# Patient Record
Sex: Male | Born: 1960 | Race: White | Hispanic: No | Marital: Married | Smoking: Current every day smoker
Health system: Southern US, Community
[De-identification: ages and names within clinical notes are randomized; demographics above are authoritative.]

## PROBLEM LIST (undated history)

## (undated) DIAGNOSIS — K5732 Diverticulitis of large intestine without perforation or abscess without bleeding: Secondary | ICD-10-CM

## (undated) DIAGNOSIS — R000 Tachycardia, unspecified: Secondary | ICD-10-CM

## (undated) DIAGNOSIS — C61 Malignant neoplasm of prostate: Secondary | ICD-10-CM

## (undated) DIAGNOSIS — F419 Anxiety disorder, unspecified: Secondary | ICD-10-CM

## (undated) DIAGNOSIS — Z8719 Personal history of other diseases of the digestive system: Secondary | ICD-10-CM

## (undated) DIAGNOSIS — Z720 Tobacco use: Secondary | ICD-10-CM

## (undated) DIAGNOSIS — C4491 Basal cell carcinoma of skin, unspecified: Secondary | ICD-10-CM

## (undated) DIAGNOSIS — F329 Major depressive disorder, single episode, unspecified: Secondary | ICD-10-CM

## (undated) DIAGNOSIS — K219 Gastro-esophageal reflux disease without esophagitis: Secondary | ICD-10-CM

## (undated) DIAGNOSIS — E785 Hyperlipidemia, unspecified: Secondary | ICD-10-CM

## (undated) DIAGNOSIS — I10 Essential (primary) hypertension: Secondary | ICD-10-CM

## (undated) DIAGNOSIS — F32A Depression, unspecified: Secondary | ICD-10-CM

## (undated) DIAGNOSIS — E781 Pure hyperglyceridemia: Secondary | ICD-10-CM

## (undated) HISTORY — DX: Malignant neoplasm of prostate: C61

## (undated) HISTORY — DX: Major depressive disorder, single episode, unspecified: F32.9

## (undated) HISTORY — DX: Personal history of other diseases of the digestive system: Z87.19

## (undated) HISTORY — DX: Anxiety disorder, unspecified: F41.9

## (undated) HISTORY — DX: Depression, unspecified: F32.A

## (undated) HISTORY — DX: Tobacco use: Z72.0

## (undated) HISTORY — DX: Hyperlipidemia, unspecified: E78.5

## (undated) HISTORY — PX: INSERTION PROSTATE RADIATION SEED: SUR718

## (undated) HISTORY — DX: Pure hyperglyceridemia: E78.1

## (undated) HISTORY — DX: Gastro-esophageal reflux disease without esophagitis: K21.9

## (undated) HISTORY — DX: Diverticulitis of large intestine without perforation or abscess without bleeding: K57.32

## (undated) HISTORY — DX: Essential (primary) hypertension: I10

## (undated) HISTORY — DX: Tachycardia, unspecified: R00.0

## (undated) HISTORY — DX: Basal cell carcinoma of skin, unspecified: C44.91

---

## 1999-09-26 ENCOUNTER — Inpatient Hospital Stay (HOSPITAL_COMMUNITY): Admission: EM | Admit: 1999-09-26 | Discharge: 1999-09-29 | Payer: Self-pay | Admitting: Gastroenterology

## 2000-02-25 ENCOUNTER — Emergency Department (HOSPITAL_COMMUNITY): Admission: EM | Admit: 2000-02-25 | Discharge: 2000-02-25 | Payer: Self-pay | Admitting: Emergency Medicine

## 2000-02-25 ENCOUNTER — Encounter: Payer: Self-pay | Admitting: Emergency Medicine

## 2004-07-24 ENCOUNTER — Inpatient Hospital Stay (HOSPITAL_COMMUNITY): Admission: EM | Admit: 2004-07-24 | Discharge: 2004-07-27 | Payer: Self-pay | Admitting: Emergency Medicine

## 2004-07-26 ENCOUNTER — Encounter (INDEPENDENT_AMBULATORY_CARE_PROVIDER_SITE_OTHER): Payer: Self-pay | Admitting: *Deleted

## 2008-08-30 ENCOUNTER — Encounter: Payer: Self-pay | Admitting: Family Medicine

## 2009-01-19 ENCOUNTER — Ambulatory Visit: Payer: Self-pay | Admitting: Family Medicine

## 2009-01-19 DIAGNOSIS — E7800 Pure hypercholesterolemia, unspecified: Secondary | ICD-10-CM | POA: Insufficient documentation

## 2009-01-19 DIAGNOSIS — Z8719 Personal history of other diseases of the digestive system: Secondary | ICD-10-CM

## 2009-01-19 DIAGNOSIS — K219 Gastro-esophageal reflux disease without esophagitis: Secondary | ICD-10-CM

## 2009-01-19 HISTORY — DX: Personal history of other diseases of the digestive system: Z87.19

## 2009-01-23 LAB — CONVERTED CEMR LAB
BUN: 9 mg/dL (ref 6–23)
CO2: 31 meq/L (ref 19–32)
Calcium: 9.4 mg/dL (ref 8.4–10.5)
Chloride: 97 meq/L (ref 96–112)
Cholesterol: 282 mg/dL — ABNORMAL HIGH (ref 0–200)
Creatinine, Ser: 1.1 mg/dL (ref 0.4–1.5)
Direct LDL: 184.1 mg/dL
GFR calc non Af Amer: 75.37 mL/min (ref >60)
Glucose, Bld: 95 mg/dL (ref 70–99)
HDL: 42.7 mg/dL (ref >39.00)
PSA: 3.03 ng/mL (ref 0.10–4.00)
Potassium: 4.3 meq/L (ref 3.5–5.1)
Sodium: 141 meq/L (ref 135–145)
Total CHOL/HDL Ratio: 7
Triglycerides: 330 mg/dL — ABNORMAL HIGH (ref 0.0–149.0)
VLDL: 66 mg/dL — ABNORMAL HIGH (ref 0.0–40.0)

## 2009-01-24 ENCOUNTER — Telehealth: Payer: Self-pay | Admitting: Family Medicine

## 2009-01-26 ENCOUNTER — Ambulatory Visit: Payer: Self-pay | Admitting: Family Medicine

## 2009-01-26 LAB — CONVERTED CEMR LAB
Cholesterol, target level: 200 mg/dL
HDL goal, serum: 40 mg/dL
LDL Goal: 130 mg/dL

## 2009-01-28 ENCOUNTER — Encounter: Payer: Self-pay | Admitting: Family Medicine

## 2009-04-27 ENCOUNTER — Ambulatory Visit: Payer: Self-pay | Admitting: Family Medicine

## 2009-04-27 LAB — CONVERTED CEMR LAB
ALT: 14 units/L (ref 0–53)
AST: 16 units/L (ref 0–37)
Albumin: 4.5 g/dL (ref 3.5–5.2)
Alkaline Phosphatase: 38 units/L — ABNORMAL LOW (ref 39–117)
Bilirubin, Direct: 0 mg/dL (ref 0.0–0.3)
Cholesterol: 242 mg/dL — ABNORMAL HIGH (ref 0–200)
Direct LDL: 174.6 mg/dL
HDL: 38.2 mg/dL — ABNORMAL LOW (ref >39.00)
Total Bilirubin: 1.1 mg/dL (ref 0.3–1.2)
Total CHOL/HDL Ratio: 6
Total Protein: 7.7 g/dL (ref 6.0–8.3)
Triglycerides: 167 mg/dL — ABNORMAL HIGH (ref 0.0–149.0)
VLDL: 33.4 mg/dL (ref 0.0–40.0)

## 2009-05-16 ENCOUNTER — Ambulatory Visit: Payer: Self-pay | Admitting: Family Medicine

## 2009-05-30 ENCOUNTER — Encounter: Payer: Self-pay | Admitting: Family Medicine

## 2009-12-21 ENCOUNTER — Ambulatory Visit: Payer: Self-pay | Admitting: Family Medicine

## 2009-12-21 DIAGNOSIS — F329 Major depressive disorder, single episode, unspecified: Secondary | ICD-10-CM | POA: Insufficient documentation

## 2010-01-23 ENCOUNTER — Ambulatory Visit: Payer: Self-pay | Admitting: Family Medicine

## 2010-01-23 DIAGNOSIS — R030 Elevated blood-pressure reading, without diagnosis of hypertension: Secondary | ICD-10-CM

## 2010-02-03 ENCOUNTER — Emergency Department: Payer: Self-pay | Admitting: Internal Medicine

## 2010-02-03 ENCOUNTER — Encounter: Payer: Self-pay | Admitting: Family Medicine

## 2010-02-09 ENCOUNTER — Ambulatory Visit: Payer: Self-pay | Admitting: Family Medicine

## 2010-02-09 DIAGNOSIS — I10 Essential (primary) hypertension: Secondary | ICD-10-CM | POA: Insufficient documentation

## 2010-03-15 ENCOUNTER — Ambulatory Visit: Payer: Self-pay | Admitting: Family Medicine

## 2010-05-16 ENCOUNTER — Telehealth: Payer: Self-pay | Admitting: Family Medicine

## 2010-06-15 ENCOUNTER — Telehealth (INDEPENDENT_AMBULATORY_CARE_PROVIDER_SITE_OTHER): Payer: Self-pay | Admitting: *Deleted

## 2010-07-25 ENCOUNTER — Telehealth (INDEPENDENT_AMBULATORY_CARE_PROVIDER_SITE_OTHER): Payer: Self-pay | Admitting: *Deleted

## 2010-07-26 ENCOUNTER — Ambulatory Visit: Payer: Self-pay | Admitting: Family Medicine

## 2010-07-26 LAB — CONVERTED CEMR LAB
ALT: 19 units/L (ref 0–53)
AST: 25 units/L (ref 0–37)
Albumin: 4.4 g/dL (ref 3.5–5.2)
Alkaline Phosphatase: 48 units/L (ref 39–117)
BUN: 15 mg/dL (ref 6–23)
Basophils Absolute: 0.1 10*3/uL (ref 0.0–0.1)
Basophils Relative: 0.9 % (ref 0.0–3.0)
Bilirubin, Direct: 0.1 mg/dL (ref 0.0–0.3)
CO2: 31 meq/L (ref 19–32)
Calcium: 9.7 mg/dL (ref 8.4–10.5)
Chloride: 102 meq/L (ref 96–112)
Cholesterol: 258 mg/dL — ABNORMAL HIGH (ref 0–200)
Creatinine, Ser: 1.2 mg/dL (ref 0.4–1.5)
Direct LDL: 186.9 mg/dL
Eosinophils Absolute: 0.1 10*3/uL (ref 0.0–0.7)
Eosinophils Relative: 1.5 % (ref 0.0–5.0)
GFR calc non Af Amer: 68.41 mL/min (ref >60.00)
Glucose, Bld: 94 mg/dL (ref 70–99)
HCT: 42 % (ref 39.0–52.0)
HDL: 49.1 mg/dL (ref >39.00)
Hemoglobin: 14.3 g/dL (ref 13.0–17.0)
Lymphocytes Relative: 31.4 % (ref 12.0–46.0)
Lymphs Abs: 2.1 10*3/uL (ref 0.7–4.0)
MCHC: 33.9 g/dL (ref 30.0–36.0)
MCV: 92.8 fL (ref 78.0–100.0)
Monocytes Absolute: 0.7 10*3/uL (ref 0.1–1.0)
Monocytes Relative: 10.3 % (ref 3.0–12.0)
Neutro Abs: 3.7 10*3/uL (ref 1.4–7.7)
Neutrophils Relative %: 55.9 % (ref 43.0–77.0)
PSA: 3.53 ng/mL (ref 0.10–4.00)
Platelets: 286 10*3/uL (ref 150.0–400.0)
Potassium: 5 meq/L (ref 3.5–5.1)
RBC: 4.53 M/uL (ref 4.22–5.81)
RDW: 13.7 % (ref 11.5–14.6)
Sodium: 142 meq/L (ref 135–145)
TSH: 2.06 microintl units/mL (ref 0.35–5.50)
Total Bilirubin: 0.7 mg/dL (ref 0.3–1.2)
Total CHOL/HDL Ratio: 5
Total Protein: 7.2 g/dL (ref 6.0–8.3)
Triglycerides: 135 mg/dL (ref 0.0–149.0)
VLDL: 27 mg/dL (ref 0.0–40.0)
WBC: 6.5 10*3/uL (ref 4.5–10.5)

## 2010-07-28 ENCOUNTER — Ambulatory Visit: Payer: Self-pay | Admitting: Family Medicine

## 2010-09-05 NOTE — Assessment & Plan Note (Signed)
Summary: 6 WK FU DLO   Vital Signs:  Patient profile:   50 year old male Height:      68 inches Weight:      191.8 pounds BMI:     29.27 Temp:     99.2 degrees F oral Pulse rate:   92 / minute Pulse rhythm:   regular BP sitting:   120 / 82  (left arm) Cuff size:   regular  Vitals Entered By: Benny Lennert CMA Duncan Dull) (March 15, 2010 12:01 PM)  History of Present Illness: Chief complaint 6 week follow up  50 year old male:  Dep  HTN    Allergies (verified): No Known Drug Allergies   Impression & Recommendations:  Problem # 1:  HYPERTENSION (ICD-401.9) Assessment Improved  His updated medication list for this problem includes:    Hydrochlorothiazide 12.5 Mg Tabs (Hydrochlorothiazide) .Marland Kitchen... Take 1 tab  by mouth every morning  BP today: 120/82 Prior BP: 120/90 (02/09/2010)  Labs Reviewed: K+: 4.3 (01/19/2009) Creat: : 1.1 (01/19/2009)   Chol: 242 (04/27/2009)   HDL: 38.20 (04/27/2009)   TG: 167.0 (04/27/2009)  Problem # 2:  DEPRESSION (ICD-311) Assessment: Improved >15 minutes spent in face to face time with patient, >50% spent in counselling or coordination of care : the patient is dramatically better, this point he has no symptoms, he does not feel anxious or depressed. Only took a handful of Xanax at last time that I saw him from till now. He is sleeping well, not irritable, has a normal energy level, and is not down and depressed and wants to do things normally.  His updated medication list for this problem includes:    Citalopram Hydrobromide 40 Mg Tabs (Citalopram hydrobromide) .Marland Kitchen... 1 by mouth at bedtime    Alprazolam 0.25 Mg Tabs (Alprazolam) .Marland Kitchen... Take one tablet every 8 hours as needed for anxiety  Complete Medication List: 1)  Protonix 40 Mg Tbec (Pantoprazole sodium) .... Once daily 2)  Pravastatin Sodium 40 Mg Tabs (Pravastatin sodium) .... One by mouth at bedtime 3)  Citalopram Hydrobromide 40 Mg Tabs (Citalopram hydrobromide) .Marland Kitchen.. 1 by mouth at  bedtime 4)  Alprazolam 0.25 Mg Tabs (Alprazolam) .... Take one tablet every 8 hours as needed for anxiety 5)  Hydrochlorothiazide 12.5 Mg Tabs (Hydrochlorothiazide) .... Take 1 tab  by mouth every morning  Patient Instructions: 1)  Prephysical Labs, several days before, fasting 2)  BMP, HFP, FLP, CBC with diff, TSH, PSA: 272.4, v77.1, ,780.79, v76.44  Prescriptions: PRAVASTATIN SODIUM 40 MG  TABS (PRAVASTATIN SODIUM) one by mouth at bedtime  #90 x 3   Entered and Authorized by:   Hannah Beat MD   Signed by:   Hannah Beat MD on 03/15/2010   Method used:   Electronically to        [facility]  [phone]* (retail)       [address]       [place], [place]  [address]       Ph: [phone] or [phone]       Fax: [phone]   RxID:   [id number] HYDROCHLOROTHIAZIDE 12.5 MG  TABS (HYDROCHLOROTHIAZIDE) Take 1 tab  by mouth every morning  #90 x 3   Entered and Authorized by:   Hannah Beat MD   Signed by:   Hannah Beat MD on 03/15/2010   Method used:   Electronically to        [facility]  [phone]* (  retail)       [address]       [place], [place]  [address]       Ph: [phone] or [phone]       Fax: [phone]   RxID:   [id number] CITALOPRAM HYDROBROMIDE 40 MG TABS (CITALOPRAM HYDROBROMIDE) 1 by mouth at bedtime  #90 x 3   Entered and Authorized by:   Hannah Beat MD   Signed by:   Hannah Beat MD on 03/15/2010   Method used:   Electronically to        [facility]  [phone]* (retail)       [address]       [place], [place]  [address]       Ph: [phone] or [phone]       Fax: [phone]   RxID:   [id number] PROTONIX 40 MG  TBEC (PANTOPRAZOLE SODIUM) once daily  #90 Tablet x 3   Entered and Authorized by:   Hannah Beat MD   Signed by:   Hannah Beat MD on 03/15/2010   Method used:   Electronically to         [facility]  [phone]* (retail)       [address]       [place], [place]  [address]       Ph: [phone] or [phone]       Fax: [phone]   RxID:   [id number]   Current Allergies (reviewed today): No known allergies

## 2010-09-05 NOTE — Progress Notes (Signed)
Summary: Chantix  Phone Note Call from Patient Call back at Work Phone [phone]   Caller: Patient Call For: Hannah Beat MD Summary of Call: pt states he's on his last week of Chantix, what does he do now? would he just stop cold [place]? pt has been 15days without a cigarette. Please advise. Initial call taken by: Mervin Hack CMA Duncan Dull),  June 15, 2010 10:47 AM  Follow-up for Phone Call        no, should continue to get baseline two times a day refills, already sent to pharmacy, continue for 3 months.   keep off cigarettes! Hannah Beat MD  June 15, 2010 11:01 AM  Follow-up by: Hannah Beat MD,  June 15, 2010 11:01 AM  Additional Follow-up for Phone Call Additional follow up Details #1::        Left message for patient to return my call.Consuello Masse CMA   Additional Follow-up by: Benny Lennert CMA Duncan Dull),  June 15, 2010 1:55 PM    Additional Follow-up for Phone Call Additional follow up Details #2::    Patient advised.Consuello Masse CMA   Follow-up by: Benny Lennert CMA Duncan Dull),  June 16, 2010 8:13 AM

## 2010-09-05 NOTE — Assessment & Plan Note (Signed)
Summary: still feeling depressed   Vital Signs:  Patient profile:   50 year old male Height:      68 inches Weight:      193.6 pounds BMI:     29.54 Temp:     98.7 degrees F oral Pulse rate:   76 / minute Pulse rhythm:   regular BP sitting:   120 / 90  (left arm) Cuff size:   regular  Vitals Entered By: Benny Lennert CMA Duncan Dull) (February 09, 2010 9:51 AM)  History of Present Illness: Chief complaint still feels depressed  HTN, new onset, BP in 170's range this past week, went to [facility]. Sent to ER, concern for MI, EKG. Eval in ER, EKG, enzymes, CT - all normal Sent home with Xanax - acute anxiety attack  HTN: BP has been persistently high, high with home BP checks 140-170. no meds now  138/90 on my recheck  Allergies (verified): No Known Drug Allergies  Past History:  Past medical, surgical, family and social histories (including risk factors) reviewed, and no changes noted (except as noted below).  Past Medical History: HYPERLIPIDEMIA GERD  Basal Cell Skin CA Diverticulitis, hx of Gastrointestinal hemorrhage, hx of Mild prostate enlargement Depression Hypertension  Family History: Reviewed history from 01/19/2009 and no changes required. Family History Breast cancer 1st degree relative <50 Family History of Cardiovascular disorder Family History of Neurological disorder  Father, 1's CAD, CABG x 2 +CVA, grandparent  Social History: Reviewed history from 01/19/2009 and no changes required. Occupation: Pharmacist, hospital, Conservation officer, historic buildings, [employer]. Married Current Smoker Alcohol use-yes, rare Drug use-no Regular exercise-no  Review of Systems      See HPI General:  Denies chills and fever. Psych:  Complains of anxiety and depression.  Physical Exam  Additional Exam:  GEN: WDWN, NAD, Non-toxic, A & O x 3 HEENT: Atraumatic, Normocephalic. Neck supple. No masses, No LAD. Ears and Nose: No external deformity. CV: RRR, No M/G/R. No JVD. No thrill. No extra  heart sounds. PULM: CTA B, no wheezes, crackles, rhonchi. No retractions. No resp. distress. No accessory muscle use. EXTR: No c/c/e NEURO: Normal gait.  PSYCH: Normally interactive. Conversant. Not depressed or anxious appearing.  Calm demeanor.     Impression & Recommendations:  Problem # 1:  HYPERTENSION (ICD-401.9) Assessment New start BP meds  His updated medication list for this problem includes:    Hydrochlorothiazide 12.5 Mg Tabs (Hydrochlorothiazide) .Marland Kitchen... Take 1 tab  by mouth every morning  BP today: 120/90 Prior BP: 160/90 (01/23/2010)  Labs Reviewed: K+: 4.3 (01/19/2009) Creat: : 1.1 (01/19/2009)   Chol: 242 (04/27/2009)   HDL: 38.20 (04/27/2009)   TG: 167.0 (04/27/2009)  Problem # 2:  DEPRESSION (ICD-311) Assessment: Deteriorated anxiety and dep - deteriorated, unstable, to ER for acute panic attack last week d/w him, increase Celexa Rare Xanax use, use only for emergencies  His updated medication list for this problem includes:    Citalopram Hydrobromide 40 Mg Tabs (Citalopram hydrobromide) .Marland Kitchen... 1 by mouth at bedtime    Alprazolam 0.25 Mg Tabs (Alprazolam) .Marland Kitchen... Take one tablet every 8 hours as needed for anxiety  Complete Medication List: 1)  Protonix 40 Mg Tbec (Pantoprazole sodium) .... Once daily 2)  Simvastatin 40 Mg Tabs (Simvastatin) .Marland Kitchen.. 1 by mouth at bedtime 3)  Citalopram Hydrobromide 40 Mg Tabs (Citalopram hydrobromide) .Marland Kitchen.. 1 by mouth at bedtime 4)  Alprazolam 0.25 Mg Tabs (Alprazolam) .... Take one tablet every 8 hours as needed for anxiety 5)  Hydrochlorothiazide 12.5  Mg Tabs (Hydrochlorothiazide) .... Take 1 tab  by mouth every morning  Patient Instructions: 1)  f/u 4-6 weeks Prescriptions: HYDROCHLOROTHIAZIDE 12.5 MG  TABS (HYDROCHLOROTHIAZIDE) Take 1 tab  by mouth every morning  #30 x 5   Entered and Authorized by:   Hannah Beat MD   Signed by:   Hannah Beat MD on 02/09/2010   Method used:   Electronically to        [facility]  [address] [facility]  [phone]* (retail)       [address] [place], [place]  [address]       Ph: [phone] or [phone]       Fax: [phone]   RxID:   [id number] CITALOPRAM HYDROBROMIDE 40 MG TABS (CITALOPRAM HYDROBROMIDE) 1 by mouth at bedtime  #30 x 11   Entered and Authorized by:   Hannah Beat MD   Signed by:   Hannah Beat MD on 02/09/2010   Method used:   Electronically to        [facility]  [address] [phone]* (retail)       [address] [place], [place]  [address]       Ph: [phone] or [phone]       Fax: [phone]   RxID:   [id number]   Current Allergies (reviewed today): No known allergies

## 2010-09-05 NOTE — Assessment & Plan Note (Signed)
Summary: 30 MIN DEPRESSION/CLE   Vital Signs:  Patient profile:   50 year old male Height:      68 inches Weight:      195.6 pounds BMI:     29.85 Temp:     98.4 degrees F oral Pulse rate:   76 / minute Pulse rhythm:   regular BP sitting:   140 / 100  (left arm) Cuff size:   regular  Vitals Entered By: Benny Lennert CMA Duncan Dull) (Dec 21, 2009 10:03 AM)  History of Present Illness: Chief complaint depression  50 yo:  job is really stresing him out to the max and feels like he is crying a ll the time not himself  youngest daughter was final tomorrow. Not excited yesterday.  Visiting daughter this weekend.   ahedonia. Sporadic sleep.   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     Allergies (verified): No Known Drug Allergies   Impression & Recommendations:  Problem # 1:  DEPRESSION (ICD-311) >15 minutes spent in face to face time with patient, >50% spent in counselling or coordination of care: significantly depressed for 6 months. Start SSRI and psychology referral. No si/hi.  His updated medication list for this problem includes:    Citalopram Hydrobromide 20 Mg Tabs (Citalopram hydrobromide) .Marland Kitchen... 1 by mouth daily  Orders: Psychology Referral (Psychology)  Complete Medication List: 1)  Protonix 40 Mg Tbec (Pantoprazole sodium) .... Once daily 2)  Simvastatin 40 Mg Tabs (Simvastatin) .Marland Kitchen.. 1 by mouth at bedtime 3)  Citalopram Hydrobromide 20 Mg Tabs (Citalopram hydrobromide) .Marland Kitchen.. 1 by mouth daily  Patient Instructions: 1)  f/u 4 weeks Prescriptions: CITALOPRAM HYDROBROMIDE 20 MG TABS (CITALOPRAM HYDROBROMIDE) 1 by mouth daily  #30 x 3   Entered and Authorized by:   Hannah Beat MD   Signed by:   Hannah Beat MD on 12/21/2009   Method used:   Electronically to        [facility]  [address] [phone]* (retail)       [address] [place], [place]  [address]       Ph: [phone] or [phone]       Fax: [phone]   RxID:    [id number]   Current Allergies (reviewed today): No known allergies

## 2010-09-05 NOTE — Assessment & Plan Note (Signed)
Summary: 4 wk f/u dlo   Vital Signs:  Patient profile:   50 year old male Height:      68 inches Weight:      193.8 pounds BMI:     29.57 Temp:     98.7 degrees F oral Pulse rate:   76 / minute Pulse rhythm:   regular BP sitting:   160 / 90  (left arm) Cuff size:   regular  Vitals Entered By: Benny Lennert CMA Duncan Dull) (January 23, 2010 12:08 PM)  History of Present Illness: Chief complaint 4 wk follow up   f/u dep: anhedonia is better, still a lot of job stress, no overt depression now, has been getting out and doing things. tol meds without se  Elevated BP: 160/90 today has been really nervous to come in to see me. normally not high  Allergies (verified): No Known Drug Allergies  Past History:  Past Medical History: HYPERLIPIDEMIA GERD  Basal Cell Skin CA Diverticulitis, hx of Gastrointestinal hemorrhage, hx of Mild prostate enlargement Depression  Review of Systems       dep improved no fever, chills, sweats  Physical Exam  General:  GEN: WDWN, NAD, Non-toxic, A & O x 3 HEENT: Atraumatic, Normocephalic. Neck supple. No masses, No LAD. Ears and Nose: No external deformity. EXTR: No c/c/e NEURO: Normal gait.  PSYCH: Normally interactive. Conversant. Not depressed or anxious appearing.  Calm demeanor.     Impression & Recommendations:  Problem # 1:  DEPRESSION (ICD-311) Assessment Improved doing ok lots of job stress -- reviewed exercise, talking with friends family, activity  His updated medication list for this problem includes:    Citalopram Hydrobromide 20 Mg Tabs (Citalopram hydrobromide) .Marland Kitchen... 1 by mouth daily  Problem # 2:  ELEVATED BLOOD PRESSURE (ICD-796.2) Assessment: New recheck at pharmacy in next couple of weeks  Complete Medication List: 1)  Protonix 40 Mg Tbec (Pantoprazole sodium) .... Once daily 2)  Simvastatin 40 Mg Tabs (Simvastatin) .Marland Kitchen.. 1 by mouth at bedtime 3)  Citalopram Hydrobromide 20 Mg Tabs (Citalopram hydrobromide) .Marland Kitchen.. 1  by mouth daily  Current Allergies (reviewed today): No known allergies

## 2010-09-05 NOTE — Letter (Signed)
Summary: Date Range: 05-16-02 to 08-30-08/[facility]  Date Range: 05-16-02 to 08-30-08/[facility]   Imported By: Sherian Rein 04/26/2010 10:34:28  _____________________________________________________________________  External Attachment:    Type:   Image     Comment:   External Document

## 2010-09-05 NOTE — Progress Notes (Signed)
Summary: wants something for smoking cessation  Phone Note Call from Patient Call back at Work Phone [phone]   Caller: Patient Call For: Hannah Beat MD Summary of Call: Pt is ready to quit smoking and he would like something called to [facility]. Initial call taken by: Lowella Petties CMA,  May 16, 2010 1:02 PM  Follow-up for Phone Call        sent to [facility].  also, please call in refills for chantix as below, (not the starter pack)  we have discussed before chantix Follow-up by: Hannah Beat MD,  May 17, 2010 9:14 AM  Additional Follow-up for Phone Call Additional follow up Details #1::        Patient advised and rx sent to pharmacy with refills Additional Follow-up by: Benny Lennert CMA Duncan Dull),  May 17, 2010 9:18 AM    New/Updated Medications: CHANTIX STARTING MONTH PAK 0.5 MG X 11 & 1 MG X 42  MISC (VARENICLINE TARTRATE) 0.5mg  by mouth once daily for 3 days, then twice daily for 4 days and then 1mg  by mouth 2 times daily CHANTIX 1 MG TABS (VARENICLINE TARTRATE) 1 tab by mouth 2 times daily Prescriptions: CHANTIX 1 MG TABS (VARENICLINE TARTRATE) 1 tab by mouth 2 times daily  #60 x 3   Entered by:   Benny Lennert CMA (AAMA)   Authorized by:   Hannah Beat MD   Signed by:   Benny Lennert CMA (AAMA) on 05/17/2010   Method used:   Electronically to        [facility]  [phone]* (retail)       [address]       [place], [place]  [address]       Ph: [phone] or [phone]       Fax: [phone]   RxID:   [id number] CHANTIX STARTING MONTH PAK 0.5 MG X 11 & 1 MG X 42  MISC (VARENICLINE TARTRATE) 0.5mg  by mouth once daily for 3 days, then twice daily for 4 days and then 1mg  by mouth 2 times daily  #1 pack x 0   Entered and Authorized by:   Hannah Beat MD   Signed by:   Hannah Beat MD on 05/17/2010   Method used:   Electronically to        [facility]  [phone]*  (retail)       [address]       [place], [place]  [address]       Ph: [phone] or [phone]       Fax: [phone]   RxID:   [id number]

## 2010-09-06 ENCOUNTER — Encounter: Payer: Self-pay | Admitting: Family Medicine

## 2010-09-07 NOTE — Progress Notes (Signed)
----   Converted from flag ---- ---- 07/24/2010 1:10 PM, Hannah Beat MD wrote: Prephysical Labs, several days before, fasting BMP, HFP, FLP, CBC with diff, TSH, PSA: 272.4, v58.69, v76.44   ---- 07/24/2010 12:45 PM, Liane Comber CMA (AAMA) wrote: Lab orders please! Good Morning! This pt is scheduled for cpx labs Wed, which labs to draw and dx codes to use? Thanks Tasha ------------------------------

## 2010-09-07 NOTE — Assessment & Plan Note (Signed)
Summary: CPX,PT HAD TO HAVE CPX BEFORE END   Vital Signs:  Patient profile:   50 year old male Height:      68 inches Weight:      200.50 pounds BMI:     30.60 Temp:     98.0 degrees F oral Pulse rate:   90 / minute Pulse rhythm:   regular BP sitting:   120 / 82  (left arm) Cuff size:   regular  Vitals Entered By: Benny Lennert CMA Duncan Dull) (July 28, 2010 11:10 AM)  History of Present Illness: Chief complaint cpx  50 year old male:  colonoscopy - [place].  Preventive Screening-Counseling & Management  Alcohol-Tobacco     Alcohol drinks/day: 0     Alcohol Counseling: not indicated; use of alcohol is not excessive or problematic     Smoking Status: quit < 6 months     Smoking Cessation Counseling: yes     Smoke Cessation Stage: contemplative     Packs/Day: 1.0     Year Started: 1980     Tobacco Counseling: to remain off tobacco products  Caffeine-Diet-Exercise     Diet Comments: not optimal     Diet Counseling: to improve diet; diet is suboptimal     Does Patient Exercise: yes     Exercise Counseling: to improve exercise regimen  Hep-HIV-STD-Contraception     Hepatitis Risk: no risk noted     HIV Risk: no risk noted     STD Risk: no risk noted     STD Risk Counseling: not indicated-no STD risk noted     Contraception Counseling: not indicated; no questions/concerns expressed     Dental Visit-last 6 months yes     TSE monthly: no     Testicular SE Education/Counseling to perform regular STE     Sun Exposure-Excessive: no  Safety-Violence-Falls     Seat Belt Use: yes     Violence in the Home: no risk noted     Violence Counseling: not indicated; no violence risk noted     Sexual Abuse: no     Sexual Abuse Counseling: no     Fall Risk Counseling: not indicated; no significant falls noted      Sexual History:  currently monogamous.        Drug Use:  never.        Blood Transfusions:  yes and after 2001.    Clinical Review Panels:  Prevention  Last PSA:  3.53 (07/26/2010)  Immunizations   Last Tetanus Booster:  given (08/06/2005)   Last Flu Vaccine:  Historical (06/06/2010)   Last PPD Date Read:  01/28/2009 (01/28/2009)   Last PPD Result:  negative (01/28/2009)  Lipid Management   Cholesterol:  258 (07/26/2010)   HDL (good cholesterol):  49.10 (07/26/2010)  Diabetes Management   Creatinine:  1.2 (07/26/2010)   Last Flu Vaccine:  Historical (06/06/2010)  CBC   WBC:  6.5 (07/26/2010)   RBC:  4.53 (07/26/2010)   Hgb:  14.3 (07/26/2010)   Hct:  42.0 (07/26/2010)   Platelets:  286.0 (07/26/2010)   MCV  92.8 (07/26/2010)   MCHC  33.9 (07/26/2010)   RDW  13.7 (07/26/2010)   PMN:  55.9 (07/26/2010)   Lymphs:  31.4 (07/26/2010)   Monos:  10.3 (07/26/2010)   Eosinophils:  1.5 (07/26/2010)   Basophil:  0.9 (07/26/2010)  Complete Metabolic Panel   Glucose:  94 (07/26/2010)   Sodium:  142 (07/26/2010)   Potassium:  5.0 (07/26/2010)   Chloride:  102 (07/26/2010)   CO2:  31 (07/26/2010)   BUN:  15 (07/26/2010)   Creatinine:  1.2 (07/26/2010)   Albumin:  4.4 (07/26/2010)   Total Protein:  7.2 (07/26/2010)   Calcium:  9.7 (07/26/2010)   Total Bili:  0.7 (07/26/2010)   Alk Phos:  48 (07/26/2010)   SGPT (ALT):  19 (07/26/2010)   SGOT (AST):  25 (07/26/2010)   Allergies (verified): No Known Drug Allergies  Past History:  Past medical, surgical, family and social histories (including risk factors) reviewed, and no changes noted (except as noted below).  Past Medical History: Reviewed history from 02/09/2010 and no changes required. HYPERLIPIDEMIA GERD  Basal Cell Skin CA Diverticulitis, hx of Gastrointestinal hemorrhage, hx of Mild prostate enlargement Depression Hypertension  Family History: Reviewed history from 01/19/2009 and no changes required. Family History Breast cancer 1st degree relative <50 Family History of Cardiovascular disorder Family History of Neurological disorder  Father, 27's CAD, CABG  x 2 +CVA, grandparent  Social History: Reviewed history from 01/19/2009 and no changes required. Occupation: Pharmacist, hospital, Conservation officer, historic buildings, [employer]. Married Current Smoker Alcohol use-yes, rare Drug use-no Regular exercise-no Smoking Status:  quit < 6 months Does Patient Exercise:  yes  Review of Systems  General: Denies fever, chills, sweats, and anorexia. Eyes: Denies blurring. ENT: Denies earache, ear discharge, decreased hearing, nasal congestion, and sore throat. CV: Denies chest pains, dyspnea on exertion, palpitations, and syncope. Resp: Denies cough, cough with exercise, dyspnea at rest, excessive sputum, nighttime cough or wheeze, and wheezing GI: Denies nausea, vomiting, diarrhea, constipation, change in bowel habits, abdominal pain, melena, BRBPR  GU: dysuria, discharge, frequency,genital sores, STD concern. MS: no back pain, joint pain, stiffness, and arthritis. Derm: No rash, itching, and dryness Neuro: No abnormal gait, frequent headaches, paresthesias, seizures, vertigo, and weakness Psych: No anxiety, behavioral problems, compulsive behavior, depression, hyperactivity, and inattentive. Endo: No polydipsia, polyphagia, polyuria, and unusual weight change Heme: No bruising or LAD Allergy: No urticaria or hayfever   Otherwise, the pertinent positives and negatives are listed above and in the HPI, otherwise a full review of systems has been reviewed and is negative unless noted positive.    Impression & Recommendations:  Problem # 1:  HEALTH MAINTENANCE EXAM (ICD-V70.0) The patient's preventative maintenance and recommended screening tests for an annual wellness exam were reviewed in full today. Brought up to date unless services declined.  Counselled on the importance of diet, exercise, and its role in overall health and mortality. The patient's FH and SH was reviewed, including their home life, tobacco status, and drug and alcohol status.   keep of  cigs We will obtain records from the patient's prior physicians. Colonoscopy  Problem # 2:  HYPERTENSION (ICD-401.9) Assessment: Improved  pt brought up going off meds -- poor control off, bad idea  His updated medication list for this problem includes:    Hydrochlorothiazide 12.5 Mg Tabs (Hydrochlorothiazide) .Marland Kitchen... Take 1 tab  by mouth every morning  BP today: 120/82 Prior BP: 120/82 (03/15/2010)  Prior 10 Yr Risk Heart Disease: Not enough information (01/26/2009)  Labs Reviewed: K+: 5.0 (07/26/2010) Creat: : 1.2 (07/26/2010)   Chol: 258 (07/26/2010)   HDL: 49.10 (07/26/2010)   TG: 135.0 (07/26/2010)  Problem # 3:  HYPERLIPIDEMIA (ICD-272.4) Assessment: Deteriorated he self d/c med but will restart  The following medications were removed from the medication list:    Pravastatin Sodium 40 Mg Tabs (Pravastatin sodium) ..... One by mouth at bedtime  Labs Reviewed: SGOT: 25 (07/26/2010)  SGPT: 19 (07/26/2010)  Lipid Goals: Chol Goal: 200 (01/26/2009)   HDL Goal: 40 (01/26/2009)   LDL Goal: 130 (01/26/2009)   TG Goal: 150 (01/26/2009)  Prior 10 Yr Risk Heart Disease: Not enough information (01/26/2009)   HDL:49.10 (07/26/2010), 38.20 (04/27/2009)  Chol:258 (07/26/2010), 242 (04/27/2009)  Trig:135.0 (07/26/2010), 167.0 (04/27/2009)  Problem # 4:  DEPRESSION (ICD-311) Assessment: Improved wanted to go off meds - bring back and consider no earlier than 3 months from now  His updated medication list for this problem includes:    Citalopram Hydrobromide 40 Mg Tabs (Citalopram hydrobromide) .Marland Kitchen... 1 by mouth at bedtime    Alprazolam 0.25 Mg Tabs (Alprazolam) .Marland Kitchen... Take one tablet every 8 hours as needed for anxiety  Complete Medication List: 1)  Protonix 40 Mg Tbec (Pantoprazole sodium) .... Once daily 2)  Citalopram Hydrobromide 40 Mg Tabs (Citalopram hydrobromide) .Marland Kitchen.. 1 by mouth at bedtime 3)  Alprazolam 0.25 Mg Tabs (Alprazolam) .... Take one tablet every 8 hours as needed  for anxiety 4)  Hydrochlorothiazide 12.5 Mg Tabs (Hydrochlorothiazide) .... Take 1 tab  by mouth every morning  Patient Instructions: 1)  recheck 3 months 2)  Free PSA, Total PSA: v76.44 3)  HFP: v58.69 4)  FLP: 272.4   Orders Added: 1)  Est. Patient 40-64 years [99396] 2)  Est. Patient Level III [16109]   Immunization History:  Influenza Immunization History:    Influenza:  historical (06/06/2010)   Immunization History:  Influenza Immunization History:    Influenza:  Historical (06/06/2010)  Current Allergies (reviewed today): No known allergies    Prevention & Chronic Care Immunizations   Influenza vaccine: Historical  (06/06/2010)   Influenza vaccine due: 04/07/2011    Tetanus booster: 08/06/2005: given   Tetanus booster due: 08/07/2015    Pneumococcal vaccine: Not documented  Colorectal Screening   Hemoccult: Not documented    Colonoscopy: Not documented  Other Screening   PSA: 3.53  (07/26/2010)   PSA due due: 01/19/2010   Smoking status: quit < 6 months  (07/28/2010)  Lipids   Total Cholesterol: 258  (07/26/2010)   LDL: Not documented   LDL Direct: 186.9  (07/26/2010)   HDL: 49.10  (07/26/2010)   Triglycerides: 135.0  (07/26/2010)    SGOT (AST): 25  (07/26/2010)   SGPT (ALT): 19  (07/26/2010)   Alkaline phosphatase: 48  (07/26/2010)   Total bilirubin: 0.7  (07/26/2010)  Hypertension   Last Blood Pressure: 120 / 82  (07/28/2010)   Serum creatinine: 1.2  (07/26/2010)   Serum potassium 5.0  (07/26/2010)  Self-Management Support :    Hypertension self-management support: Not documented    Lipid self-management support: Not documented    Physical Exam General Appearance: well developed, well nourished, no acute distress Eyes: conjunctiva and lids normal, PERRLA, EOMI Ears, Nose, Mouth, Throat: TM clear, nares clear, oral exam WNL Neck: supple, no lymphadenopathy, no thyromegaly, no JVD Respiratory: clear to auscultation and  percussion, respiratory effort normal Cardiovascular: regular rate and rhythm, S1-S2, no murmur, rub or gallop, no bruits, peripheral pulses normal and symmetric, no cyanosis, clubbing, edema or varicosities Chest: no scars, masses, tenderness; no asymmetry, skin changes, nipple discharge, no gynecomastia   Gastrointestinal: soft, non-tender; no hepatosplenomegaly, masses; active bowel sounds all quadrants, no masses, tenderness, hemorrhoids  Genitourinary: no hernia, testicular mass, penile discharge, priapism or prostate enlargement Lymphatic: no cervical, axillary or inguinal adenopathy Musculoskeletal: gait normal, muscle tone and strength WNL, no joint swelling, effusions, discoloration, crepitus  Skin: clear, good turgor, color  WNL, no rashes, lesions, or ulcerations Neurologic: normal mental status, normal reflexes, normal strength, sensation, and motion Psychiatric: alert; oriented to person, place and time Other Exam:

## 2010-10-05 ENCOUNTER — Telehealth (INDEPENDENT_AMBULATORY_CARE_PROVIDER_SITE_OTHER): Payer: Self-pay | Admitting: *Deleted

## 2010-10-12 NOTE — Progress Notes (Signed)
Summary: wants to wean off citalopram  Phone Note Call from Patient Call back at Work Phone [phone]   Caller: Patient Call For: Hannah Beat MD Summary of Call: Pt wants to wean off citalopram, he will need more called to [facility], with instructions on how to do it.              Lowella Petties CMA, AAMA  October 05, 2010 4:28 PM   Follow-up for Phone Call        as below drop to 20 mg for 2 weeks, then 10 mg (1/2 a tab) for 2 weeks.  If he does poorly, gets significanly depressed or anxious or has problems, f/u and see [place]. Follow-up by: Hannah Beat MD,  October 05, 2010 5:15 PM  Additional Follow-up for Phone Call Additional follow up Details #1::        Patient advised via message on machine at work with injstructions to call back with any questions or if he has any problems with medication reduction Additional Follow-up by: Benny Lennert CMA (AAMA),  October 06, 2010 8:03 AM    New/Updated Medications: CITALOPRAM HYDROBROMIDE 20 MG TABS (CITALOPRAM HYDROBROMIDE) 1 by mouth x 2 weeks, then 1/2 tab by mouth x 2 weeks Prescriptions: CITALOPRAM HYDROBROMIDE 20 MG TABS (CITALOPRAM HYDROBROMIDE) 1 by mouth x 2 weeks, then 1/2 tab by mouth x 2 weeks  #30 x 0   Entered and Authorized by:   Hannah Beat MD   Signed by:   Hannah Beat MD on 10/05/2010   Method used:   [facility]        [facility]  [address] [phone] (retail[facility]       [address] [place], [place]  [address]       Ph: [phone] or [phone]       Fax: [phone]   RxID:   [id number]

## 2010-10-17 ENCOUNTER — Other Ambulatory Visit (INDEPENDENT_AMBULATORY_CARE_PROVIDER_SITE_OTHER): Payer: Managed Care, Other (non HMO)

## 2010-10-17 ENCOUNTER — Encounter: Payer: Self-pay | Admitting: Family Medicine

## 2010-10-17 ENCOUNTER — Encounter (INDEPENDENT_AMBULATORY_CARE_PROVIDER_SITE_OTHER): Payer: Self-pay | Admitting: *Deleted

## 2010-10-17 ENCOUNTER — Other Ambulatory Visit: Payer: Self-pay | Admitting: Family Medicine

## 2010-10-17 DIAGNOSIS — Z125 Encounter for screening for malignant neoplasm of prostate: Secondary | ICD-10-CM

## 2010-10-17 DIAGNOSIS — E785 Hyperlipidemia, unspecified: Secondary | ICD-10-CM

## 2010-10-17 LAB — LIPID PANEL
Cholesterol: 232 mg/dL — ABNORMAL HIGH (ref 0–200)
HDL: 44.7 mg/dL (ref >39.00)
VLDL: 71.2 mg/dL — ABNORMAL HIGH (ref 0.0–40.0)

## 2010-10-17 LAB — HEPATIC FUNCTION PANEL: Albumin: 4.5 g/dL (ref 3.5–5.2)

## 2010-10-23 ENCOUNTER — Ambulatory Visit (INDEPENDENT_AMBULATORY_CARE_PROVIDER_SITE_OTHER): Payer: Managed Care, Other (non HMO) | Admitting: Family Medicine

## 2010-10-23 ENCOUNTER — Encounter: Payer: Self-pay | Admitting: Family Medicine

## 2010-10-23 DIAGNOSIS — F329 Major depressive disorder, single episode, unspecified: Secondary | ICD-10-CM

## 2010-10-23 DIAGNOSIS — E785 Hyperlipidemia, unspecified: Secondary | ICD-10-CM

## 2010-10-23 DIAGNOSIS — R972 Elevated prostate specific antigen [PSA]: Secondary | ICD-10-CM

## 2010-10-23 DIAGNOSIS — C61 Malignant neoplasm of prostate: Secondary | ICD-10-CM

## 2010-10-23 DIAGNOSIS — I10 Essential (primary) hypertension: Secondary | ICD-10-CM

## 2010-10-23 HISTORY — DX: Malignant neoplasm of prostate: C61

## 2010-10-31 ENCOUNTER — Encounter: Payer: Self-pay | Admitting: Family Medicine

## 2010-11-02 NOTE — Assessment & Plan Note (Signed)
Summary: 3 month f/u DLO   Vital Signs:  Patient profile:   50 year old male Height:      68 inches Weight:      204 pounds BMI:     31.13 Temp:     98.0 degrees F oral Pulse rate:   80 / minute Pulse rhythm:   regular BP sitting:   130 / 90  (left arm) Cuff size:   regular  Vitals Entered By: Linde Gillis CMA Duncan Dull) (October 23, 2010 11:58 AM) CC: 3 month follow up   History of Present Illness: 50 year old male:  HTN: 140/90 on my recheck generally compliant with his meds  Chol: back on pravastatin - no SE Lipid Panel: reviewed last resultsChol:     232 (10/17/2010 8:36:14 AM)HDL:     44.70 (10/17/2010 8:36:14   elevated PSA: 4.5 now, 3.5 3 months ago -- no symptoms, no dysuria, discharge or pain. consult [facility]  depression, weaned off meds, has only had episode x 1  REVIEW OF SYSTEMS GEN: No acute illnesses, no fever, chills, sweats. CV: No chest pain or SOB GI: No noted N or V Otherwise, pertinent positives and negatives are noted in the HPI.   GEN: WDWN, NAD, Non-toxic, A & O x 3 HEENT: Atraumatic, Normocephalic. Neck supple. No masses, No LAD. Ears and Nose: No external deformity. CV: RRR, No M/G/R. No JVD. No thrill. No extra heart sounds. PULM: CTA B, no wheezes, crackles, rhonchi. No retractions. No resp. distress. No accessory muscle use. EXTR: No c/c/e NEURO: Normal gait.  PSYCH: Normally interactive. Conversant. Not depressed or anxious appearing.  Calm demeanor.      Allergies (verified): No Known Drug Allergies  Past History:  Past medical, surgical, family and social histories (including risk factors) reviewed, and no changes noted (except as noted below).  Past Medical History: Reviewed history from 02/09/2010 and no changes required. HYPERLIPIDEMIA GERD  Basal Cell Skin CA Diverticulitis, hx of Gastrointestinal hemorrhage, hx of Mild prostate enlargement Depression Hypertension  Family History: Reviewed history from  01/19/2009 and no changes required. Family History Breast cancer 1st degree relative <50 Family History of Cardiovascular disorder Family History of Neurological disorder  Father, 58's CAD, CABG x 2 +CVA, grandparent  Social History: Reviewed history from 01/19/2009 and no changes required. Occupation: Pharmacist, hospital, Conservation officer, historic buildings, [employer]. Married Current Smoker Alcohol use-yes, rare Drug use-no Regular exercise-no   Impression & Recommendations:  Problem # 1:  PROSTATE SPECIFIC ANTIGEN, ELEVATED (ICD-790.93) Assessment New discussed concern - at 30, needs urological eval  Orders: Urology Referral (Urology)  Problem # 2:  HYPERTENSION (ICD-401.9) Assessment: Deteriorated increase HCTZ  The following medications were removed from the medication list:    Hydrochlorothiazide 12.5 Mg Tabs (Hydrochlorothiazide) .Marland Kitchen... Take 1 tab  by mouth every morning His updated medication list for this problem includes:    Hydrochlorothiazide 25 Mg Tabs (Hydrochlorothiazide) .Marland Kitchen... Take 1 tab by mouth every morning  Problem # 3:  HYPERLIPIDEMIA (ICD-272.4) Assessment: Deteriorated change to lipitor  His updated medication list for this problem includes:    Atorvastatin Calcium 40 Mg Tabs (Atorvastatin calcium) .Marland Kitchen... 1 by mouth at night  Problem # 4:  DEPRESSION (ICD-311) titrate off meds  The following medications were removed from the medication list:    Alprazolam 0.25 Mg Tabs (Alprazolam) .Marland Kitchen... Take one tablet every 8 hours as needed for anxiety    Citalopram Hydrobromide 20 Mg Tabs (Citalopram hydrobromide) .Marland Kitchen... 1 by mouth x 2 weeks, then 1/2  tab by mouth x 2 weeks  Complete Medication List: 1)  Protonix 40 Mg Tbec (Pantoprazole sodium) .... Once daily 2)  Hydrochlorothiazide 25 Mg Tabs (Hydrochlorothiazide) .... Take 1 tab by mouth every morning 3)  Atorvastatin Calcium 40 Mg Tabs (Atorvastatin calcium) .Marland Kitchen.. 1 by mouth at night  Patient Instructions: 1)  Referral  Appointment Information 2)  Day/Date: 3)  Time: 4)  Place/MD: 5)  Address: 6)  Phone/Fax: 7)  Patient given appointment information. Information/Orders faxed/mailed.  Prescriptions: ATORVASTATIN CALCIUM 40 MG TABS (ATORVASTATIN CALCIUM) 1 by mouth at night  #30 x 11   Entered and Authorized by:   Hannah Beat MD   Signed by:   Hannah Beat MD on 10/23/2010   Method used:   Print then Give to Patient   RxID:   [id number] HYDROCHLOROTHIAZIDE 25 MG  TABS (HYDROCHLOROTHIAZIDE) Take 1 tab by mouth every morning  #90 x 3   Entered and Authorized by:   Hannah Beat MD   Signed by:   Hannah Beat MD on 10/23/2010   Method used:   Print then Give to Patient   RxID:   [id number]    Orders Added: 1)  Urology Referral [Urology] 2)  Est. Patient Level IV [44010]    Current Allergies (reviewed today): No known allergies

## 2010-12-07 ENCOUNTER — Other Ambulatory Visit: Payer: Self-pay | Admitting: Family Medicine

## 2010-12-22 NOTE — Op Note (Signed)
Logan Norris, Logan Norris NO.:  [id number]   MEDICAL RECORD NO.:  [id number]          PATIENT TYPE:  INP   LOCATION:  2028                         FACILITY:  [facility]   PHYSICIAN:  Petra Kuba, M.D.    DATE OF BIRTH:  11-Aug-1960   DATE OF PROCEDURE:  07/26/2004  DATE OF DISCHARGE:                                 OPERATIVE REPORT   PROCEDURE:  Colonoscopy with biopsy.   ENDOSCOPIST:  Petra Kuba, M.D.   INDICATION:  Lower gastrointestinal bleeding.   INFORMED CONSENT:  Consent was signed after risks, benefits, methods and  options were thoroughly discussed prior to any premedications given over the  last couple of days.   MEDICATIONS USED:  Demerol 40 mg, Versed 6 mg.   PROCEDURE:  Rectal inspection was pertinent for external hemorrhoids, small.  Digital exam was negative.  Pediatric video adjustable colonoscope was  inserted, fairly easily advanced around the colon to the cecum; this did  require rolling him on his back and some abdominal pressure.  A few left-  sided diverticula were seen on insertion as well as some old blood in the  left greater than the right side of the colon.  Also on insertion, a small  distal transverse, possibly splenic flexure polyp was seen.  The cecum was  identified by the appendiceal orifice and the ileocecal valve.  In fact, the  scope was inserted a short ways into the terminal ileum, which was normal.  There seemed to be some brown stool in the TI, without any obvious blood.  Scope was slowly withdrawn.  The prep was adequate.  What residual blood  remained was easily washed and suctioned.  No active bleeding was seen as we  slowly withdrew.  Cecum and ascending were normal.  The polyp seen on  insertion seemed to be more in the splenic flexure and was cold-biopsied x3  and put in the first container.  One distal transverse diverticulum was  seen.  The scope was further withdrawn.  A few left-sided diverticula were  seen.   Also, 2 tiny descending polyps were seen which were cold-biopsied and  put in a second container.  No signs of active bleeding or other  abnormalities were seen as we slowly withdrew back to the rectum.  Anorectal  pull-through in retroflexion confirmed some small hemorrhoids.  Scope was  straightened and readvanced a short ways up the left side of the colon, air  was suctioned and scope removed.  Patient tolerated the procedure well.  There was no obvious immediate complication.   ENDOSCOPIC DIAGNOSES:  1.  Internal/external hemorrhoids.  2.  Two tiny descending polyps, cold-biopsied.  3.  Splenic flexure polyp, cold-biopsied.  4.  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  5.  Otherwise within normal limits to the terminal ileum, doubt old blood in      the terminal ileum, seeing brown stool.   PLAN:  Await pathology to determine future colonic screening.  We will  slowly  advance diet.  No aspirin or nonsteroidals probably long-term, since  he has had problem with both ulcers and now bleeding from diverticula.  Consider small-bowel follow-through or Meckel's scan in the future if we  suspect any other etiologies, but probable diverticula.  Will see back  p.r.n. if able to go home soon, or in 1 month to recheck CBCs and guaiacs to  make sure no further workup plans are needed.       MEM/MEDQ  D:  07/26/2004  T:  07/27/2004  Job:  [id number]   cc:   Sharlet Salina, M.D.  [address]  [place]  [place] [address]  Fax: [phone]

## 2010-12-22 NOTE — Op Note (Signed)
Logan Norris, PECH NO.:  [id number]   MEDICAL RECORD NO.:  [id number]          PATIENT TYPE:  EMS   LOCATION:  MAJO                         FACILITY:  [facility]   PHYSICIAN:  Petra Kuba, M.D.    DATE OF BIRTH:  1961/01/22   DATE OF PROCEDURE:  07/24/2004  DATE OF DISCHARGE:                                 OPERATIVE REPORT   PROCEDURE:  Flexible sigmoidoscopy.   INDICATIONS FOR PROCEDURE:  GI bleeding, probably lower, negative endoscopy  just to be sure.  Consent was signed prior to any premeds given after risks,  benefits, methods, and options were thoroughly discussed in the office.   MEDICATIONS USED:  No additional medicines were done for this procedure.   PROCEDURE:  Rectal inspection was pertinent for small external hemorrhoids.  Digital exam was negative.  The video pediatric adjustable colonoscope was  inserted and advanced to 50 cm.  Nothing but maroon blood was seen, although  a few diverticula were seen.  With abdominal pressure, we were able to  advance probably to 85 cm, probably in the mid transverse, but other than  maroon blood, nothing else could be seen.  We could not clear the lumen.  We  kept clogging the scope so we elected to withdraw, although no obvious  active bleeding was seen.  Lots of old maroon blood was seen throughout the  colon.  A rare tic was seen, but visualization was poor, at best.  We  elected not to retroflex back in the rectum due to the amount of blood.  The  air was suctioned, the scope was removed.  The patient tolerated the  procedure adequately.  There was no obvious immediate complications.   ENDOSCOPIC DIAGNOSIS:  1.  Occasional left sided tic.  2.  Increased maroon blood to the mid transverse, unable to advance, unable      to suction.  3.  Unable to retroflexion due to increased blood in the rectum.   PLAN:  Get a STAT nuclear bleeding scan, transfuse p.r.n., consult surgery.       MEM/MEDQ  D:   07/24/2004  T:  07/25/2004  Job:  [id number]   cc:   Sharlet Salina, M.D.  [address]  [place]  [place] [address]  Fax: [phone]

## 2010-12-22 NOTE — Discharge Summary (Signed)
NAMEJUSTINO, BOZE NO.:  [id number]   MEDICAL RECORD NO.:  [id number]          PATIENT TYPE:  INP   LOCATION:  2028                         FACILITY:  [facility]   PHYSICIAN:  Petra Kuba, M.D.    DATE OF BIRTH:  May 30, 1961   DATE OF ADMISSION:  07/24/2004  DATE OF DISCHARGE:  07/27/2004                                 DISCHARGE SUMMARY   HISTORY OF PRESENT ILLNESS:  The patient was admitted for GI bleeding.  It  sounded lower, but he has a history of ulcers and is on an aspirin a day.  Once stabilized in the ER, we went ahead and proceeded with an endo which  showed a hiatal hernia but no blood, and proceed with the flex sig which  showed lots of fresh blood to about the mid sigmoid.  Dr. Wenda Low of  Surgery was consulted.  We did undergo a nuclear medicine scan which was  negative but believes his cause of bleeding was diverticula.  He was allowed  clear liquids.  We did proceed with a colonoscopy on July 26, 2004,  which did show some tiny polyps which were cold biopsied as well as left  sided diverticula and one transverse diverticula.  Otherwise, it was normal  at the terminal ilium.  I did not believe there was __________ at the  terminal ilium.  Stools seemed to be brown.  His diet was further advanced  at that point.  His hemoglobin remained stable, and he was elected to be  discharged on December 27, without any obvious problems from the procedure.   DISCHARGE DIAGNOSES:  1.  Gastrointestinal bleeding and anemia.  2.  Probable diverticular bleed.  3.  Hiatal hernia.   DISCHARGE INSTRUCTIONS:  1.  Protonix daily.  2.  Tylenol okay and only.  3.  One iron pill daily.   DISCHARGE INSTRUCTIONS:  1.  Call sooner if increased bleeding, weakness, fatigue or other questions      or problems.  2.  Pain management not applicable.  3.  Activity is to return to work with light duty in one week.  4.  Diet:  No nuts, seeds, popcorn, __________, uncooked  vegetables, salads      for one to two weeks.  5.  Special Instructions:  No aspirin or nonsteroidals long term.  Tylenol      okay and only for pain medicine.  Call if he needs something stronger.   FOLLOWUP:  Follow up is with me, Dr. Ewing Schlein, in one month.  Call my office  and set up a blood count check next week, August 01, 2004.   CONDITION ON DISCHARGE:  Improved.      MEM/MEDQ  D:  10/11/2004  T:  10/12/2004  Job:  [id number]   cc:   Sharlet Salina, M.D.  [address]  [place]  [place] [address]  Fax: [phone]

## 2010-12-22 NOTE — Op Note (Signed)
NAMEAYAD, NIEMAN NO.:  [id number]   MEDICAL RECORD NO.:  [id number]          PATIENT TYPE:  INP   LOCATION:  2028                         FACILITY:  [facility]   PHYSICIAN:  Petra Kuba, M.D.    DATE OF BIRTH:  1961/04/10   DATE OF PROCEDURE:  07/25/2004  DATE OF DISCHARGE:                                 OPERATIVE REPORT   PROCEDURE:  Esophagogastroduodenoscopy.   ENDOSCOPIST:  Petra Kuba, M.D.   INDICATIONS:  GI bleeding, history of ulcers, on an aspirin a day.   INFORMED CONSENT:  Consent was signed after risks, benefits, methods and  options were thoroughly discussed before any premedications given.   MEDICATIONS USED:  Versed 2.5 mg, Demerol 25 mg.   DESCRIPTION OF PROCEDURE:  The video endoscope was inserted by direct  vision.  He did have a large hiatal hernia.  The scope passed into the  stomach.  No signs of bleeding were seen.  It was advanced through a normal  antrum, normal pylorus, into a normal duodenal bulb and around the C-loop to  a normal second portion of the duodenum.  No blood was seen distally.  The  scope was withdrawn back to the bulb which again confirmed its normal  appearance.  The scope was withdrawn back to the stomach and retroflexed.  High in the cardia, the hiatal hernia was confirmed.  The angularis, lesser  and greater curve were evaluated on quick visualization without obvious  abnormality.  He had some difficulty holding air which made complete  visualization difficult.  The air was suctioned.  The scope was slowly  withdrawn, again other than the hiatal hernia, confirming the normal  esophagus.  The scope was removed.  The patient tolerated the procedure  well.  There was no immediate complications.   ENDOSCOPIC DIAGNOSES:  1.  Large hiatal hernia.  2.  Difficulty holding air which made complete visualization of the stomach      difficult.  3.  Normal antrum, pylorus, bulb and second portion of the duodenum  without      any blood being seen.   PLAN:  1.  Continue pump inhibitors.  2.  Hold aspirin.  3.  Continue workup with a flexible sigmoidoscopy; please see that dictation      for other workup, plans and recommendations.       MEM/MEDQ  D:  07/24/2004  T:  07/25/2004  Job:  [id number]   cc:   Sharlet Salina, M.D.  [address]  [place]  [place] [address]  Fax: [phone]

## 2010-12-22 NOTE — H&P (Signed)
NAMEABDULLAH, Logan Norris NO.:  [id number]   MEDICAL RECORD NO.:  [id number]          PATIENT TYPE:  INP   LOCATION:  2103                         FACILITY:  [facility]   PHYSICIAN:  Petra Kuba, M.D.    DATE OF BIRTH:  1960-09-23   DATE OF ADMISSION:  07/24/2004  DATE OF DISCHARGE:                                HISTORY & PHYSICAL   HISTORY OF PRESENT ILLNESS:  The patient is seen at the request of Donnetta Hutching, M.D., the ER physician.  He presented with significant bright red  blood per rectum and near syncope and is hypotensive tachycardia.  He has  been on an aspirin a day.  He has a history of ulcers in the past.  It  sounds like it was cauterized, but had been on his Protonix as well.  I do  not have his old records, unfortunately.  He had been in his normal state of  health without any problems when the bleeding started acutely.  He has had  some gas and cramps, but no other complaints.   PAST MEDICAL HISTORY:  Ulcer as above, but no other problems including no  surgeries.   ALLERGIES:  No known drug allergies.   CURRENT MEDICATIONS:  1.  Aspirin one a day.  2.  Protonix.   SOCIAL HISTORY:  Does smoke.  Does not drink.  Denies drug use.  Minimizes  other over-the-counter medicines.   PHYSICAL EXAMINATION:  GENERAL:  Slightly hypotensive tachycardic, although  not as bad as when he hit the emergency room.  HEENT:  Sclerae nonicteric, slightly pale.  LUNGS:  Clear with regular rate and rhythm.  ABDOMEN:  Soft, nontender.   LABORATORY DATA AND X-RAY FINDINGS:  Normal coagulations.  Normal BUN and  creatinine with potassium 3.2.  Hemoglobin 11.3 with normal white count,  platelet count and MCV.  Liver tests normal.   ASSESSMENT:  Gastrointestinal bleeding, sounds lower, but a patient with  ulcers on an aspirin a day.   PLAN:  Risks, benefits and methods of endoscopy and flexible sigmoidoscopy  were discussed.  Will go ahead and admit him to the hospital.   Transfuse  p.r.n.  Follow H&H and proceed with above procedures which were discussed  not only with the patient, but his family and they have all agreed with  further workup and plans pending findings.       MEM/MEDQ  D:  07/24/2004  T:  07/25/2004  Job:  [id number]   cc:   Sharlet Salina, M.D.  [address]  [place]  [place] [address]  Fax: [phone]

## 2011-02-27 ENCOUNTER — Ambulatory Visit
Admission: RE | Admit: 2011-02-27 | Discharge: 2011-02-27 | Disposition: A | Discharge status: Home / Self Care | Payer: Managed Care, Other (non HMO) | Source: Ambulatory Visit | Attending: Radiation Oncology | Admitting: Radiation Oncology

## 2011-02-27 DIAGNOSIS — C61 Malignant neoplasm of prostate: Secondary | ICD-10-CM | POA: Insufficient documentation

## 2011-02-27 DIAGNOSIS — F3289 Other specified depressive episodes: Secondary | ICD-10-CM | POA: Insufficient documentation

## 2011-02-27 DIAGNOSIS — I10 Essential (primary) hypertension: Secondary | ICD-10-CM | POA: Insufficient documentation

## 2011-02-27 DIAGNOSIS — F411 Generalized anxiety disorder: Secondary | ICD-10-CM | POA: Insufficient documentation

## 2011-02-27 DIAGNOSIS — F329 Major depressive disorder, single episode, unspecified: Secondary | ICD-10-CM | POA: Insufficient documentation

## 2011-02-27 DIAGNOSIS — Z79899 Other long term (current) drug therapy: Secondary | ICD-10-CM | POA: Insufficient documentation

## 2011-03-12 ENCOUNTER — Ambulatory Visit
Admission: RE | Admit: 2011-03-12 | Discharge: 2011-03-12 | Disposition: A | Discharge status: Home / Self Care | Payer: Managed Care, Other (non HMO) | Source: Ambulatory Visit | Attending: Urology | Admitting: Urology

## 2011-03-12 ENCOUNTER — Other Ambulatory Visit: Payer: Self-pay | Admitting: Urology

## 2011-03-12 DIAGNOSIS — Z01811 Encounter for preprocedural respiratory examination: Secondary | ICD-10-CM

## 2011-04-16 LAB — COMPREHENSIVE METABOLIC PANEL
ALT: 30 U/L (ref 0–53)
CO2: 30 mEq/L (ref 19–32)
Calcium: 9.6 mg/dL (ref 8.4–10.5)
Creatinine, Ser: 0.98 mg/dL (ref 0.50–1.35)
GFR calc Af Amer: >60 mL/min (ref >60)
GFR calc non Af Amer: >60 mL/min (ref >60)
Glucose, Bld: 95 mg/dL (ref 70–99)

## 2011-04-16 LAB — CBC
Platelets: 262 10*3/uL (ref 150–400)
RBC: 4.58 MIL/uL (ref 4.22–5.81)
RDW: 13.6 % (ref 11.5–15.5)
WBC: 8.2 10*3/uL (ref 4.0–10.5)

## 2011-04-16 LAB — APTT: aPTT: 34 seconds (ref 24–37)

## 2011-04-16 LAB — PROTIME-INR: INR: 0.96 (ref 0.00–1.49)

## 2011-04-23 ENCOUNTER — Ambulatory Visit (HOSPITAL_BASED_OUTPATIENT_CLINIC_OR_DEPARTMENT_OTHER)
Admission: RE | Admit: 2011-04-23 | Discharge: 2011-04-23 | Disposition: A | Discharge status: Home / Self Care | Payer: Managed Care, Other (non HMO) | Source: Ambulatory Visit | Attending: Urology | Admitting: Urology

## 2011-04-23 ENCOUNTER — Ambulatory Visit (HOSPITAL_COMMUNITY): Payer: Managed Care, Other (non HMO) | Attending: Urology

## 2011-04-23 DIAGNOSIS — C61 Malignant neoplasm of prostate: Secondary | ICD-10-CM | POA: Insufficient documentation

## 2011-04-23 DIAGNOSIS — Z79899 Other long term (current) drug therapy: Secondary | ICD-10-CM | POA: Insufficient documentation

## 2011-04-23 DIAGNOSIS — Z01812 Encounter for preprocedural laboratory examination: Secondary | ICD-10-CM | POA: Insufficient documentation

## 2011-04-23 DIAGNOSIS — I10 Essential (primary) hypertension: Secondary | ICD-10-CM | POA: Insufficient documentation

## 2011-04-23 DIAGNOSIS — K219 Gastro-esophageal reflux disease without esophagitis: Secondary | ICD-10-CM | POA: Insufficient documentation

## 2011-04-23 DIAGNOSIS — Z01810 Encounter for preprocedural cardiovascular examination: Secondary | ICD-10-CM | POA: Insufficient documentation

## 2011-04-26 NOTE — Op Note (Signed)
  Logan Norris, Logan Norris NO.:  [id number]  MEDICAL RECORD NO.:  [id number]  LOCATION:                               FACILITY:  [facility]  PHYSICIAN:  Valetta Fuller, MD    DATE OF BIRTH:  10-01-1960  DATE OF PROCEDURE: DATE OF DISCHARGE:                              OPERATIVE REPORT   PREOPERATIVE DIAGNOSIS:  Adenocarcinoma of the prostate (favorable clinical stage T1c).  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  PROCEDURE PERFORMED: 1. Prostatic seed implantation (iodine-125) via Careers adviser. 2. Flexible cystoscopy.  SURGEON:  Valetta Fuller, MD  ASSISTANT:  Maryln Gottron, M.D.  ANESTHESIA:  General.  INDICATIONS:  Logan Norris is 50 years of age.  The patient had a very minimally elevated PSA with unremarkable rectal exam.  Ultrasound revealed a 45 g prostate.  One out of the 12 biopsies of the prostate did reveal a Gleason 3+3=6 cancer involving 60% of that biopsy.  The patient underwent extensive discussion with regard to treatment options which included everything from active surveillance to radiation therapy through robotic prostatectomy.  Rather the patient chose seed implantation.  He appeared to understand the advantages, disadvantages, and potential complications of that treatment.  The patient now presents for the procedure.  TECHNIQUE AND FINDINGS:  The patient was brought to the operating room. He had successful induction of general anesthesia.  PAS compression boots were utilized and the patient received perioperative ciprofloxacin.  Radiation/Oncology initiated his care.  The patient had transrectal ultrasound probe placed which was anchored to a floor stand. Anchoring needles were placed in the prostate and a Foley catheter with contrast in the balloon was then inserted without difficulty. Radiation/Oncology Department through the guidance of Dr. Chipper Herb, performed  realtime contouring of the prostate, urethra, and rectum. With that dosing, parameters were established and a realtime dose planned.  A target dose of 145 gray.  We then came into the operating room for needle passage.  An appropriate surgical time-out was performed.  Needle passage was done with transrectal real time sagittal ultrasound guidance.  Once needles were in position, the [employer] robotic implanter was used for seed delivery.  A total number of 27 needles was utilized with 73 active seeds.  At the completion of procedure, fluoroscopic imaging revealed what appeared to be a nice distribution of the seeds without any obvious complications or problems.  The Foley catheter was removed and flexible cystoscopy failed to show anything of concern.  A Foley catheter was reinserted and the patient was brought to recovery room in stable condition.     Valetta Fuller, MD     DSG/MEDQ  D:  04/24/2011  T:  04/25/2011  Job:  [id number]  cc:   Juleen China, MD Fax: [phone]  Electronically Signed by Barron Alvine M.D. on 04/26/2011 04:54:04 PM

## 2011-05-04 ENCOUNTER — Other Ambulatory Visit: Payer: Self-pay | Admitting: Dermatology

## 2011-06-11 NOTE — Procedures (Signed)
Logan Norris underwent post implant CT dosimetry/3D simulation on 05/16/2011.  His intraoperative prostate volume ultrasound was 48.5 cc, while his postoperative prostate volume by CT was 50.0 cc, a good correlation.  Dose volume histograms were obtained for the prostate and rectum.  His prostate T90 is 118.9% and V100 is 97.1%, both excellent. Only 0.51 cc of rectum received the prescribed dose of 16109 cGy.  In summary, Logan Norris has excellent post implant dosimetry, with a low risk for late rectal toxicity.    ______________________________ Maryln Gottron, M.D. RJM/MEDQ  D:  06/07/2011  T:  06/11/2011  Job:  [id number]

## 2011-09-03 ENCOUNTER — Other Ambulatory Visit: Payer: Self-pay | Admitting: Family Medicine

## 2011-09-20 ENCOUNTER — Ambulatory Visit (INDEPENDENT_AMBULATORY_CARE_PROVIDER_SITE_OTHER): Payer: 59 | Admitting: Family Medicine

## 2011-09-20 ENCOUNTER — Other Ambulatory Visit: Payer: Self-pay | Admitting: *Deleted

## 2011-09-20 ENCOUNTER — Encounter: Payer: Self-pay | Admitting: Family Medicine

## 2011-09-20 DIAGNOSIS — F329 Major depressive disorder, single episode, unspecified: Secondary | ICD-10-CM

## 2011-09-20 DIAGNOSIS — F411 Generalized anxiety disorder: Secondary | ICD-10-CM

## 2011-09-20 DIAGNOSIS — F3289 Other specified depressive episodes: Secondary | ICD-10-CM

## 2011-09-20 DIAGNOSIS — F419 Anxiety disorder, unspecified: Secondary | ICD-10-CM

## 2011-09-20 HISTORY — DX: Anxiety disorder, unspecified: F41.9

## 2011-09-20 MED ORDER — CITALOPRAM HYDROBROMIDE 20 MG PO TABS: 20.0000 mg | ORAL_TABLET | Freq: Every day | ORAL | Status: DC

## 2011-09-20 MED ORDER — CLONAZEPAM 0.5 MG PO TABS: 0.5000 mg | ORAL_TABLET | Freq: Two times a day (BID) | ORAL | Status: DC

## 2011-09-20 NOTE — Progress Notes (Signed)
  Patient Name: Logan Norris Date of Birth: Jan 10, 1961 Age: 51 y.o. Medical Record Number: [id number] Gender: male Date of Encounter: 09/20/2011  History of Present Illness:  Logan Norris is a 51 y.o. very pleasant male patient who presents with the following:  Pulse 85  Stopped med at least 6 months ago, will obsess about orders at work. Cannot sleep. Fears the worse. For months --- feels like he is in a fog. Does not really want to be on something.   Having some suicidal thoughts. No thoughts about harming anyone else. Gun. [place]. Not sleeping well. Taking benadryl. Waking up often. Wife and 107 year old daughter. Changes at work --- wanted to go into Airline pilot.   Past Medical History, Surgical History, Social History, Family History, Problem List, Medications, and Allergies have been reviewed and updated if relevant.  Review of Systems: above  Physical Examination: Filed Vitals:   09/20/11 0915  BP: 130/90  Pulse: 111  Temp: 98.5 F (36.9 C)  TempSrc: Oral  Height: 5\' 8"  (1.727 m)  Weight: 194 lb (87.998 kg)  SpO2: 99%    Body mass index is 29.50 kg/(m^2).   GEN: WDWN, NAD, Non-toxic, Alert & Oriented x 3 HEENT: Atraumatic, Normocephalic.  Ears and Nose: No external deformity. EXTR: No clubbing/cyanosis/edema NEURO: Normal gait.  PSYCH: Normally interactive. Conversant. Cries occasionally. labile   Assessment and Plan:  1. DEPRESSION  citalopram (CELEXA) 20 MG tablet, clonazePAM (KLONOPIN) 0.5 MG tablet, Ambulatory referral to Psychology  2. Anxiety  citalopram (CELEXA) 20 MG tablet, clonazePAM (KLONOPIN) 0.5 MG tablet, Ambulatory referral to Psychology    >25 minutes spent in face to face time with patient, >50% spent in counselling or coordination of care: Acutely depressed, meets DSM criteria for major depression and anxiety. Stopped SSRI around 04/2011. More work stress, change in job. Here with wife. Decreased sleep. Decreased interest. Decreased energy.  Psychomotor retardation present. No HI. Fleeing thoughts of suicide. Endorses rare thought of using gun, but recognizes "I do not want to hurt myself." Not actively suicidal now. Wife will remove pistol from home when she gets home, secure house. Arranged ASAP consult with psychology, appreciate input. Start SSRI. For now, scheduled Klonopin - taper off when stable. F/u 2 weeks. Dad left at an early time. Some issues from childhood.

## 2011-09-21 ENCOUNTER — Ambulatory Visit (INDEPENDENT_AMBULATORY_CARE_PROVIDER_SITE_OTHER): Payer: 59 | Admitting: Psychology

## 2011-09-21 DIAGNOSIS — F411 Generalized anxiety disorder: Secondary | ICD-10-CM

## 2011-09-21 DIAGNOSIS — F331 Major depressive disorder, recurrent, moderate: Secondary | ICD-10-CM

## 2011-10-03 ENCOUNTER — Ambulatory Visit (INDEPENDENT_AMBULATORY_CARE_PROVIDER_SITE_OTHER): Payer: 59 | Admitting: Family Medicine

## 2011-10-03 ENCOUNTER — Encounter: Payer: Self-pay | Admitting: Family Medicine

## 2011-10-03 DIAGNOSIS — F3289 Other specified depressive episodes: Secondary | ICD-10-CM

## 2011-10-03 DIAGNOSIS — F329 Major depressive disorder, single episode, unspecified: Secondary | ICD-10-CM

## 2011-10-03 NOTE — Patient Instructions (Signed)
Klonopin: take 1/2 tablet twice a day for a week, then 1/2 tablet at night only, then stop.   Continue on your antidepressant.  F/u 4-6 weeks

## 2011-10-03 NOTE — Progress Notes (Signed)
  Patient Name: Logan Norris Date of Birth: [birth year] Age: 51 y.o. Medical Record Number: [id number] Gender: male Date of Encounter: 10/03/2011  History of Present Illness:  Logan Norris is a 51 y.o. very pleasant male patient who presents with the following:  F/u depression: Feels like back to himself. Getting his old job back. Sleeping OK. Interest is not quite there. Wife is exercising and going to start back with her. Thoughts of fleeting SI are gone.   Taking scheduled Klonopin right now.  Past Medical History, Surgical History, Social History, Family History, Problem List, Medications, and Allergies have been reviewed and updated if relevant.  Review of Systems: Above, no acute illness  Physical Examination: Filed Vitals:   10/03/11 0904  BP: 120/78  Pulse: 96  Temp: 98.7 F (37.1 C)  TempSrc: Oral  Height: 5\' 8"  (1.727 m)  Weight: 193 lb 1.9 oz (87.599 kg)  SpO2: 99%    Body mass index is 29.36 kg/(m^2).   GEN: WDWN, NAD, Non-toxic, Alert & Oriented x 3 HEENT: Atraumatic, Normocephalic.  Ears and Nose: No external deformity. EXTR: No clubbing/cyanosis/edema NEURO: Normal gait.  PSYCH: Normally interactive. Conversant. Not depressed or anxious appearing.  Calm demeanor.    Assessment and Plan: 1. DEPRESSION     Doing much better, taper off klonopin, recheck 4-6 weeks

## 2011-10-22 ENCOUNTER — Ambulatory Visit (INDEPENDENT_AMBULATORY_CARE_PROVIDER_SITE_OTHER): Payer: 59 | Admitting: Family Medicine

## 2011-10-22 ENCOUNTER — Encounter: Payer: Self-pay | Admitting: Family Medicine

## 2011-10-22 VITALS — BP 130/84 | HR 132 | Temp 99.0°F | Ht 70.0 in | Wt 191.8 lb

## 2011-10-22 DIAGNOSIS — F329 Major depressive disorder, single episode, unspecified: Secondary | ICD-10-CM

## 2011-10-22 DIAGNOSIS — F419 Anxiety disorder, unspecified: Secondary | ICD-10-CM

## 2011-10-22 DIAGNOSIS — R079 Chest pain, unspecified: Secondary | ICD-10-CM

## 2011-10-22 DIAGNOSIS — R000 Tachycardia, unspecified: Secondary | ICD-10-CM

## 2011-10-22 DIAGNOSIS — F411 Generalized anxiety disorder: Secondary | ICD-10-CM

## 2011-10-22 MED ORDER — CITALOPRAM HYDROBROMIDE 40 MG PO TABS: 40.0000 mg | ORAL_TABLET | Freq: Every day | ORAL | Status: DC

## 2011-10-22 MED ORDER — CLONAZEPAM 0.5 MG PO TABS: 0.5000 mg | ORAL_TABLET | Freq: Two times a day (BID) | ORAL | Status: DC | PRN

## 2011-10-22 NOTE — Progress Notes (Signed)
Patient Name: Rocko Monish Haliburton Date of Birth: 20-May-1961 Age: 51 y.o. Medical Record Number: [id number] Gender: male Date of Encounter: 10/22/2011  History of Present Illness:  Edder Keonta Alsip is a 51 y.o. very pleasant male patient who presents with the following:  F/u dep and anxiety:  The patient presents for following up on his depression and anxiety. His depression is stable, he is not suicidal, and he feels better with this regard. Over the last 2 weeks, he has had progressively increasing anxiety. He feels very anxious. His stresses in the workplace have increased and he is taking on a much more stressful position. He feels stressed almost at all times. We did titrate him off with some Klonopin that he'd been on for only about 6 weeks.  Tachycardia: The patient also presents tachycardic to the office with his pulse greater than 130. On my recheck and several times during our interview and history, the patient's pulse is greater than 120 in all cases. He relates that he has had some occasional intermittent chest pain. None currently. He has been having some tachycardia and palpitations. Cardiac risk factors include a father with myocardial infarction in his 23s. Hypertension, hyperlipidemia, long smoking history. Distant cocaine use when younger, but none of this in a very long time. No previous stress test.  Patient Active Problem List  Diagnoses  . HYPERLIPIDEMIA  . DEPRESSION  . HYPERTENSION  . GERD  . ELEVATED BLOOD PRESSURE  . Personal history of other diseases of digestive disease  . PROSTATE SPECIFIC ANTIGEN, ELEVATED  . Anxiety   Past Medical History  Diagnosis Date  . HLD (hyperlipidemia)   . GERD (gastroesophageal reflux disease)   . Basal cell carcinoma   . Diverticulitis of colon   . Prostate enlargement     Mild  . Depression   . HTN (hypertension)   . H/O: gastrointestinal hemorrhage   . Anxiety 09/20/2011   No past surgical history on file. History    Substance Use Topics  . Smoking status: Current Everyday Smoker  . Smokeless tobacco: Not on file  . Alcohol Use: Yes     rare   Family History  Problem Relation Age of Onset  . Coronary artery disease Father     CABG x2  . Stroke      Grandparent  . Breast cancer      Family history -1st degree relative <50   No Known Allergies Current Outpatient Prescriptions on File Prior to Visit  Medication Sig Dispense Refill  . hydrochlorothiazide 12.5 MG capsule Take 12.5 mg by mouth every morning.        . pantoprazole (PROTONIX) 40 MG tablet TAKE 1 TABLET BY MOUTH EVERY DAY  90 tablet  9     Past Medical History, Surgical History, Social History, Family History, Problem List, Medications, and Allergies have been reviewed and updated if relevant.  Review of Systems: As above. No fever. No chills. No acute illnesses. Palpitations. Anxiety. Tachycardia. Chest pain.  Physical Examination: Filed Vitals:   10/22/11 1015  BP: 130/84  Pulse: 132  Temp: 99 F (37.2 C)  TempSrc: Oral  Height: 5\' 10"  (1.778 m)  Weight: 191 lb 12.8 oz (87 kg)  SpO2: 99%    Body mass index is 27.52 kg/(m^2).   GEN: WDWN, NAD, Non-toxic, A & O x 3 HEENT: Atraumatic, Normocephalic. Neck supple. No masses, No LAD. Ears and Nose: No external deformity. CV: sinus tachycardic to 130, No M/G/R. No JVD. No thrill. No  extra heart sounds. PULM: CTA B, no wheezes, crackles, rhonchi. No retractions. No resp. distress. No accessory muscle use. EXTR: No c/c/e NEURO Normal gait.  PSYCH: Normally interactive. Conversant. Not depressed or anxious appearing.  Calm demeanor.    Assessment and Plan:  1. Tachycardia  EKG 12-Lead, Ambulatory referral to Cardiology  2. Chest pain  Ambulatory referral to Cardiology  3. DEPRESSION  clonazePAM (KLONOPIN) 0.5 MG tablet, citalopram (CELEXA) 40 MG tablet  4. Anxiety  clonazePAM (KLONOPIN) 0.5 MG tablet, citalopram (CELEXA) 40 MG tablet    Orders Today: Orders Placed  This Encounter  Procedures  . Ambulatory referral to Cardiology    Referral Priority:  Routine    Referral Type:  Consultation    Referral Reason:  Specialty Services Required    Requested Specialty:  Cardiology    Number of Visits Requested:  1  . EKG 12-Lead    Medications Today: Meds ordered this encounter  Medications  . clonazePAM (KLONOPIN) 0.5 MG tablet    Sig: Take 1 tablet (0.5 mg total) by mouth 2 (two) times daily as needed for anxiety.    Dispense:  60 tablet    Refill:  1  . citalopram (CELEXA) 40 MG tablet    Sig: Take 1 tablet (40 mg total) by mouth daily.    Dispense:  30 tablet    Refill:  3    EKG: sinus tachycardia to 130. Normal axis, normal R wave progression, No acute ST elevation or depression.  I explained to the patient my level of concern with his significant tachycardia, intermittent chest pain with multiple cardiac risk factors as described above. I think that this deserves attention and a formal workup, and will consult cardiology. I appreciate their assistance.  Anxiety is deteriorated. We will place him on scheduled Klonopin, and increase his Celexa to 40 mg daily. Recheck with me in 5 weeks.

## 2011-10-22 NOTE — Patient Instructions (Signed)
REFERRAL: GO THE THE FRONT ROOM AT THE ENTRANCE OF OUR CLINIC, NEAR CHECK IN. ASK FOR MARION. SHE WILL HELP YOU SET UP YOUR REFERRAL. DATE: TIME:  

## 2011-10-26 ENCOUNTER — Encounter: Payer: Self-pay | Admitting: *Deleted

## 2011-10-26 NOTE — Progress Notes (Signed)
02/27/11 I-PSS , 0000000 score=0

## 2011-11-05 DIAGNOSIS — R000 Tachycardia, unspecified: Secondary | ICD-10-CM

## 2011-11-05 HISTORY — DX: Tachycardia, unspecified: R00.0

## 2011-11-07 ENCOUNTER — Ambulatory Visit: Payer: 59 | Admitting: Family Medicine

## 2011-11-08 ENCOUNTER — Other Ambulatory Visit: Payer: Self-pay | Admitting: Family Medicine

## 2011-11-09 ENCOUNTER — Ambulatory Visit: Payer: 59 | Admitting: Cardiovascular Disease

## 2011-11-16 ENCOUNTER — Ambulatory Visit (INDEPENDENT_AMBULATORY_CARE_PROVIDER_SITE_OTHER): Payer: 59 | Admitting: Cardiovascular Disease

## 2011-11-16 ENCOUNTER — Encounter: Payer: Self-pay | Admitting: Cardiovascular Disease

## 2011-11-16 VITALS — BP 128/88 | HR 84 | Ht 68.0 in | Wt 192.0 lb

## 2011-11-16 DIAGNOSIS — I10 Essential (primary) hypertension: Secondary | ICD-10-CM

## 2011-11-16 DIAGNOSIS — R030 Elevated blood-pressure reading, without diagnosis of hypertension: Secondary | ICD-10-CM

## 2011-11-16 DIAGNOSIS — R0600 Dyspnea, unspecified: Secondary | ICD-10-CM

## 2011-11-16 DIAGNOSIS — R000 Tachycardia, unspecified: Secondary | ICD-10-CM | POA: Insufficient documentation

## 2011-11-16 DIAGNOSIS — F17200 Nicotine dependence, unspecified, uncomplicated: Secondary | ICD-10-CM

## 2011-11-16 DIAGNOSIS — R0789 Other chest pain: Secondary | ICD-10-CM

## 2011-11-16 DIAGNOSIS — Z720 Tobacco use: Secondary | ICD-10-CM

## 2011-11-16 DIAGNOSIS — E785 Hyperlipidemia, unspecified: Secondary | ICD-10-CM

## 2011-11-16 DIAGNOSIS — R079 Chest pain, unspecified: Secondary | ICD-10-CM

## 2011-11-16 NOTE — Assessment & Plan Note (Signed)
The patient was noted recently to be tachycardic with a heart rate close to 130 beats per minute due to sinus tachycardia. He has been feeling jittery with occasional lightheadedness. I suspect that this is likely due to anxiety but will have to rule out underlying cardiac arrhythmia. I recommend a 48-hour Holter monitor. His thyroid function should be checked if that has not been done recently.

## 2011-11-16 NOTE — Progress Notes (Signed)
HPI  This is a 51 year old male who is referred by Dr. Patsy Lager for evaluation of tachycardia and chest pain. The patient has chronic medical conditions that include hyperlipidemia, hypertension and tobacco use. He also has family history of premature coronary artery disease. He was diagnosed with prostate cancer last year and underwent seed implants. Since that time, he has suffered from recurrent depression and anxiety. He was treated with Celexa and most recently with clonazepam. He presented recently to Dr. Cyndie Chime office for a routine checkup. He was found to be tachycardic with a heart rate of 126 beats per minute. It was sinus tachycardia. The patient felt jittery but did not have palpitations. He was dizzy but had no syncope or presyncope. He does complain of gradual exertional dyspnea over the last year. He also had few episodes of brief left-sided chest discomfort described as sharp pain with no radiation lasting only for a brief moment. This happened at rest and has been nonexertional. He denies excessive caffeine intake. He consumes one cup of coffee a day. He is also under stress at work. 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 He continues to smoke one pack per day.  No Known Allergies   Current Outpatient Prescriptions on File Prior to Visit  Medication Sig Dispense Refill  . atorvastatin (LIPITOR) 40 MG tablet TAKE ONE TABLET BY MOUTH AT BEDTIME  30 tablet  8  . citalopram (CELEXA) 40 MG tablet Take 1 tablet (40 mg total) by mouth daily.  30 tablet  3  . clonazePAM (KLONOPIN) 0.5 MG tablet Take 1 tablet (0.5 mg total) by mouth 2 (two) times daily as needed for anxiety.  60 tablet  1  . pantoprazole (PROTONIX) 40 MG tablet TAKE 1 TABLET BY MOUTH EVERY DAY  90 tablet  9  . clonazePAM (KLONOPIN) 0.5 MG tablet Take 1 tablet (0.5 mg total) by mouth 2 (two) times daily.  60 tablet  0     Past Medical History  Diagnosis Date  . HLD (hyperlipidemia)   . GERD  (gastroesophageal reflux disease)   . Basal cell carcinoma   . Diverticulitis of colon   . Prostate enlargement     Mild  . Depression   . HTN (hypertension)   . H/O: gastrointestinal hemorrhage   . Anxiety 09/20/2011     History reviewed. No pertinent past surgical history.   Family History  Problem Relation Age of Onset  . Coronary artery disease Father     CABG x2  . Heart disease Father 5    CABG  . Stroke      Grandparent  . Breast cancer      Family history -1st degree relative <50  . Heart disease Paternal Grandfather      History   Social History  . Marital Status: Married    Spouse Name: N/A    Number of Children: N/A  . Years of Education: N/A   Occupational History  . [employer]    Social History Main Topics  . Smoking status: Current Everyday Smoker  . Smokeless tobacco: Not on file  . Alcohol Use: Yes     rare  . Drug Use: No  . Sexually Active: Not on file   Other Topics Concern  . Not on file   Social History Narrative  . No narrative on file     ROS Constitutional: Negative for fever, chills, diaphoresis, activity change, appetite change and fatigue.  HENT: Negative for  hearing loss, nosebleeds, congestion, sore throat, facial swelling, drooling, trouble swallowing, neck pain, voice change, sinus pressure and tinnitus.  Eyes: Negative for photophobia, pain, discharge and visual disturbance.  Respiratory: Negative for apnea, cough and wheezing.  Cardiovascular: Negative for  leg swelling.  Gastrointestinal: Negative for nausea, vomiting, abdominal pain, diarrhea, constipation, blood in stool and abdominal distention.  Genitourinary: Negative for dysuria, urgency, frequency, hematuria and decreased urine volume.  Musculoskeletal: Negative for myalgias, back pain, joint swelling, arthralgias and gait problem.  Skin: Negative for color change, pallor, rash and wound.  Neurological: Negative for dizziness,  tremors, seizures, syncope, speech difficulty, weakness, light-headedness, numbness and headaches.  Psychiatric/Behavioral: Negative for suicidal ideas, hallucinations, behavioral problems and agitation.      PHYSICAL EXAM   BP 128/88  Pulse 84  Ht 5\' 8"  (1.727 m)  Wt 192 lb (87.091 kg)  BMI 29.19 kg/m2  Constitutional: He is oriented to person, place, and time. He appears well-developed and well-nourished. No distress.  HENT: No nasal discharge.  Head: Normocephalic and atraumatic.  Eyes: Pupils are equal and round. Right eye exhibits no discharge. Left eye exhibits no discharge.  Neck: Normal range of motion. Neck supple. No JVD present. No thyromegaly present.  Cardiovascular: Normal rate, regular rhythm, normal heart sounds and. Exam reveals no gallop and no friction rub. No murmur heard.  Pulmonary/Chest: Effort normal and breath sounds normal. No stridor. No respiratory distress. He has no wheezes. He has no rales. He exhibits no tenderness.  Abdominal: Soft. Bowel sounds are normal. He exhibits no distension. There is no tenderness. There is no rebound and no guarding.  Musculoskeletal: Normal range of motion. He exhibits no edema and no tenderness.  Neurological: He is alert and oriented to person, place, and time. Coordination normal.  Skin: Skin is warm and dry. No rash noted. He is not diaphoretic. No erythema. No pallor.  Psychiatric: He has a normal mood and affect. His behavior is normal. Judgment and thought content normal.      EKG: Normal sinus rhythm with no significant ST or T wave changes. Normal PR and QT interval is. His recent EKG was reviewed and it showed sinus tachycardia.   ASSESSMENT AND PLAN

## 2011-11-16 NOTE — Patient Instructions (Addendum)
Your physician has requested that you have an echocardiogram. Echocardiography is a painless test that uses sound waves to create images of your heart. It provides your doctor with information about the size and shape of your heart and how well your heart's chambers and valves are working. This procedure takes approximately one hour. There are no restrictions for this procedure.  Your physician has requested that you have an exercise tolerance test. For further information please visit [email or web address] Please also follow instruction sheet, as given.  Do not eat or drink anything 4 hours prior to your exercise tolerance test. You may take all your medications with water the am of procedure.  You will need to have a 48 hour holter monitor placed.  Follow up with Dr. Kirke Corin after test.

## 2011-11-16 NOTE — Assessment & Plan Note (Signed)
I discussed with him today the importance of smoking cessation. This is really a priority given his family history and chronic medical conditions. He does not seem to be mentally prepared to quit at this time.

## 2011-11-16 NOTE — Assessment & Plan Note (Addendum)
The patient has atypical chest pain which has been brief and nonexertional. He does have multiple risk factors for coronary artery disease. I recommend a treadmill stress test for further evaluation. I had a prolonged discussion with him about the importance of controlling his risk factors and lifestyle changes given his family history of premature coronary artery disease. The patient also complains of exertional dyspnea and thus will obtain an echocardiogram to evaluate his LV systolic function.

## 2011-11-23 ENCOUNTER — Encounter: Payer: Self-pay | Admitting: Cardiovascular Disease

## 2011-11-23 ENCOUNTER — Encounter (INDEPENDENT_AMBULATORY_CARE_PROVIDER_SITE_OTHER): Payer: 59

## 2011-11-23 ENCOUNTER — Ambulatory Visit (INDEPENDENT_AMBULATORY_CARE_PROVIDER_SITE_OTHER): Payer: 59 | Admitting: Cardiovascular Disease

## 2011-11-23 VITALS — BP 126/74 | Ht 68.0 in | Wt 186.0 lb

## 2011-11-23 DIAGNOSIS — R079 Chest pain, unspecified: Secondary | ICD-10-CM

## 2011-11-23 DIAGNOSIS — R0600 Dyspnea, unspecified: Secondary | ICD-10-CM

## 2011-11-23 DIAGNOSIS — R000 Tachycardia, unspecified: Secondary | ICD-10-CM

## 2011-11-23 NOTE — Progress Notes (Signed)
    Treadmill Stress test  Indication:  Atypical chest pain and tachycardia.  Baseline Data:  Resting EKG shows sinus tachycardia with rate of  125 bpm,  no significant ST changes.  Resting blood pressure of  128/76  mm Hg Stand bruce protocal was used.  Exercise Data:  Patient exercised for 3  min  0 sec,  Peak heart rate of  159 bpm.  This was  94 % of the maximum predicted heart rate. No symptoms of chest pain or lightheadedness were reported at peak stress or in recovery.  he had dyspnea.  Peak Blood pressure recorded was 142/78  Maximal work level: 7.1  METs.  Heart rate at 3 minutes in recovery was  117 bpm. BP response: Normal HR response:  Accelerated.   EKG with Exercise:  sinus tachycardia with no significant ST changes   FINAL IMPRESSION: Normal exercise stress test. No significant EKG changes concerning for ischemia.  Poor exercise tolerance. Sinus tachycardia on baseline ECG with accelerated heart rate response to exercise.  Recommendation: Proceed with further evaluation with an echocardiogram. Consider treatment with a beta blocker.

## 2011-11-26 ENCOUNTER — Ambulatory Visit (INDEPENDENT_AMBULATORY_CARE_PROVIDER_SITE_OTHER): Payer: 59 | Admitting: Family Medicine

## 2011-11-26 ENCOUNTER — Encounter: Payer: Self-pay | Admitting: Family Medicine

## 2011-11-26 VITALS — BP 120/74 | HR 102 | Temp 98.9°F | Ht 68.0 in | Wt 194.0 lb

## 2011-11-26 DIAGNOSIS — F411 Generalized anxiety disorder: Secondary | ICD-10-CM

## 2011-11-26 DIAGNOSIS — F419 Anxiety disorder, unspecified: Secondary | ICD-10-CM

## 2011-11-26 DIAGNOSIS — F329 Major depressive disorder, single episode, unspecified: Secondary | ICD-10-CM

## 2011-11-26 NOTE — Progress Notes (Signed)
  Patient Name: Logan Norris Date of Birth: Jun 15, 1961 Age: 51 y.o. Medical Record Number: [id number] Gender: male Date of Encounter: 11/26/2011  History of Present Illness:  Logan Norris is a 51 y.o. very pleasant male patient who presents with the following:    Past Medical History, Surgical History, Social History, Family History, Problem List, Medications, and Allergies have been reviewed and updated if relevant.   Physical Examination: Filed Vitals:   11/26/11 0802  BP: 120/74  Pulse: 102  Temp: 98.9 F (37.2 C)  TempSrc: Oral  Height: 5\' 8"  (1.727 m)  Weight: 194 lb (87.998 kg)  SpO2: 97%    Body mass index is 29.50 kg/(m^2).   GEN: WDWN, NAD, Non-toxic, Alert & Oriented x 3 HEENT: Atraumatic, Normocephalic.  Ears and Nose: No external deformity. EXTR: No clubbing/cyanosis/edema NEURO: Normal gait.  PSYCH: Normally interactive. Conversant. Not depressed or anxious appearing.  Calm demeanor.   Assessment and Plan: 1. DEPRESSION   2. Anxiety     >15 minutes spent in face to face time with patient, >50% spent in counselling or coordination of care: doing much better. Feels perfectly fine. No dep or anxiety. Reviewed how to wean off klonopin. No si/hi. Sleeping ok.

## 2011-11-26 NOTE — Patient Instructions (Signed)
Clonazepam: 1/2 tab in the AM and 1 at night for 2 weeks Then 1/2 in the AM and 1/2 a tab in the PM for 2 weeks Then 1/2 tab in the PM only for 2 weeks

## 2011-12-11 ENCOUNTER — Other Ambulatory Visit: Payer: Self-pay

## 2011-12-11 ENCOUNTER — Other Ambulatory Visit (INDEPENDENT_AMBULATORY_CARE_PROVIDER_SITE_OTHER): Payer: 59

## 2011-12-11 ENCOUNTER — Other Ambulatory Visit: Payer: Self-pay | Admitting: Family Medicine

## 2011-12-11 DIAGNOSIS — R0602 Shortness of breath: Secondary | ICD-10-CM

## 2011-12-11 DIAGNOSIS — R0600 Dyspnea, unspecified: Secondary | ICD-10-CM

## 2011-12-20 ENCOUNTER — Ambulatory Visit (INDEPENDENT_AMBULATORY_CARE_PROVIDER_SITE_OTHER): Payer: 59 | Admitting: Cardiovascular Disease

## 2011-12-20 ENCOUNTER — Encounter: Payer: Self-pay | Admitting: Cardiovascular Disease

## 2011-12-20 VITALS — BP 138/83 | HR 102 | Ht 68.0 in | Wt 190.0 lb

## 2011-12-20 DIAGNOSIS — R0789 Other chest pain: Secondary | ICD-10-CM

## 2011-12-20 DIAGNOSIS — R000 Tachycardia, unspecified: Secondary | ICD-10-CM

## 2011-12-20 NOTE — Patient Instructions (Addendum)
Your heart tests were fine.  Follow up as needed.  

## 2011-12-20 NOTE — Progress Notes (Signed)
HPI  This is a 51 year old man who is here today for a followup visit. He was seen recently for palpitations, atypical chest pain and sinus tachycardia. He underwent evaluation with a treadmill stress test which showed no evidence of ischemia. He was tachycardic before starting the test. Echocardiogram showed normal LV systolic function with mild left ventricular hypertrophy and no significant valvular abnormalities. Holter monitor showed normal sinus rhythm with rare PVCs and PACs. Overall, he has been feeling better. His symptoms have improved. He seems to be dealing with anxiety better.  No Known Allergies   Current Outpatient Prescriptions on File Prior to Visit  Medication Sig Dispense Refill  . atorvastatin (LIPITOR) 40 MG tablet TAKE ONE TABLET BY MOUTH AT BEDTIME  30 tablet  8  . citalopram (CELEXA) 40 MG tablet Take 1 tablet (40 mg total) by mouth daily.  30 tablet  3  . clonazePAM (KLONOPIN) 0.5 MG tablet Take 0.5 mg by mouth 2 (two) times daily as needed.      . hydrochlorothiazide (HYDRODIURIL) 25 MG tablet TAKE ONE TABLET BY MOUTH IN THE MORNING  90 tablet  3  . pantoprazole (PROTONIX) 40 MG tablet TAKE 1 TABLET BY MOUTH EVERY DAY  90 tablet  9  . Tamsulosin HCl (FLOMAX) 0.4 MG CAPS Take 1 tablet by mouth Daily.      . clonazePAM (KLONOPIN) 0.5 MG tablet Take 1 tablet (0.5 mg total) by mouth 2 (two) times daily.  60 tablet  0  . clonazePAM (KLONOPIN) 0.5 MG tablet Take 1 tablet (0.5 mg total) by mouth 2 (two) times daily as needed for anxiety.  60 tablet  1     Past Medical History  Diagnosis Date  . HLD (hyperlipidemia)   . GERD (gastroesophageal reflux disease)   . Basal cell carcinoma   . Diverticulitis of colon   . Depression   . HTN (hypertension)   . H/O: gastrointestinal hemorrhage   . Anxiety 09/20/2011  . Tobacco abuse   . Prostate cancer 10/23/2010    Qualifier: Diagnosis of  By: Copland MD, Karleen Hampshire    . Sinus tachycardia 04/13    Holter: NSR, rare PAcs and  PVCs     Past Surgical History  Procedure Date  . Insertion prostate radiation seed      Family History  Problem Relation Age of Onset  . Coronary artery disease Father     CABG x2  . Heart disease Father 28    CABG  . Stroke      Grandparent  . Breast cancer      Family history -1st degree relative <50  . Heart disease Paternal Grandfather      History   Social History  . Marital Status: Married    Spouse Name: N/A    Number of Children: N/A  . Years of Education: N/A   Occupational History  . [employer]    Social History Main Topics  . Smoking status: Current Everyday Smoker  . Smokeless tobacco: Not on file  . Alcohol Use: Yes     rare  . Drug Use: No  . Sexually Active: Not on file   Other Topics Concern  . Not on file   Social History Narrative  . No narrative on file      PHYSICAL EXAM   BP 138/83  Pulse 102  Ht 5\' 8"  (1.727 m)  Wt 190 lb (86.183 kg)  BMI 28.89 kg/m2  Constitutional: He is oriented  to person, place, and time. He appears well-developed and well-nourished. No distress.  HENT: No nasal discharge.  Head: Normocephalic and atraumatic.  Eyes: Pupils are equal and round. Right eye exhibits no discharge. Left eye exhibits no discharge.  Neck: Normal range of motion. Neck supple. No JVD present. No thyromegaly present.  Cardiovascular: Normal rate, regular rhythm, normal heart sounds and. Exam reveals no gallop and no friction rub. No murmur heard.  Pulmonary/Chest: Effort normal and breath sounds normal. No stridor. No respiratory distress. He has no wheezes. He has no rales. He exhibits no tenderness.  Abdominal: Soft. Bowel sounds are normal. He exhibits no distension. There is no tenderness. There is no rebound and no guarding.  Musculoskeletal: Normal range of motion. He exhibits no edema and no tenderness.  Neurological: He is alert and oriented to person, place, and time. Coordination normal.    Skin: Skin is warm and dry. No rash noted. He is not diaphoretic. No erythema. No pallor.  Psychiatric: He has a normal mood and affect. His behavior is normal. Judgment and thought content normal.        ASSESSMENT AND PLAN

## 2011-12-20 NOTE — Assessment & Plan Note (Signed)
This is mostly sinus tachycardia with rare PACs and PVCs noted on Holter monitor. Overall no evidence of significant. This is likely due to his overall stress and anxiety. I explained to him that the goal of treatment is really for symptomatic relief. Overall he seems to be feeling better. If this worsens in the future, a small dose beta blocker can be considered. Followup with [place] as needed.

## 2011-12-20 NOTE — Assessment & Plan Note (Signed)
This has resolved. His treadmill stress test showed no evidence of ischemia. I discussed with the patient the importance of lifestyle changes in order to decrease the chance of future coronary artery disease and cardiovascular events. We discussed the importance of controlling risk factors, healthy diet as well as regular exercise.

## 2012-03-26 ENCOUNTER — Other Ambulatory Visit: Payer: Self-pay | Admitting: Family Medicine

## 2012-03-27 ENCOUNTER — Other Ambulatory Visit: Payer: Self-pay

## 2012-03-27 DIAGNOSIS — F329 Major depressive disorder, single episode, unspecified: Secondary | ICD-10-CM

## 2012-03-27 DIAGNOSIS — F419 Anxiety disorder, unspecified: Secondary | ICD-10-CM

## 2012-03-27 MED ORDER — CITALOPRAM HYDROBROMIDE 40 MG PO TABS: 40.0000 mg | ORAL_TABLET | Freq: Every day | ORAL | Status: DC

## 2012-03-27 NOTE — Telephone Encounter (Signed)
pts wife request refill citalopram to [facility]. Advised refill sent; pts wife will schedule CPX.

## 2012-04-08 ENCOUNTER — Other Ambulatory Visit (INDEPENDENT_AMBULATORY_CARE_PROVIDER_SITE_OTHER): Payer: 59

## 2012-04-08 DIAGNOSIS — E785 Hyperlipidemia, unspecified: Secondary | ICD-10-CM

## 2012-04-08 DIAGNOSIS — Z79899 Other long term (current) drug therapy: Secondary | ICD-10-CM

## 2012-04-08 LAB — BASIC METABOLIC PANEL
Chloride: 99 mEq/L (ref 96–112)
GFR: 63.04 mL/min (ref >60.00)
Potassium: 4.4 mEq/L (ref 3.5–5.1)
Sodium: 139 mEq/L (ref 135–145)

## 2012-04-08 LAB — CBC WITH DIFFERENTIAL/PLATELET
Basophils Relative: 0.7 % (ref 0.0–3.0)
Eosinophils Absolute: 0.2 10*3/uL (ref 0.0–0.7)
HCT: 42.6 % (ref 39.0–52.0)
Lymphs Abs: 1.8 10*3/uL (ref 0.7–4.0)
MCHC: 33.5 g/dL (ref 30.0–36.0)
MCV: 91.7 fl (ref 78.0–100.0)
Monocytes Absolute: 0.6 10*3/uL (ref 0.1–1.0)
Neutro Abs: 6.1 10*3/uL (ref 1.4–7.7)
Neutrophils Relative %: 69.7 % (ref 43.0–77.0)
RBC: 4.64 Mil/uL (ref 4.22–5.81)

## 2012-04-08 LAB — LDL CHOLESTEROL, DIRECT: Direct LDL: 95.8 mg/dL

## 2012-04-08 LAB — LIPID PANEL
HDL: 44.7 mg/dL (ref >39.00)
Triglycerides: 331 mg/dL — ABNORMAL HIGH (ref 0.0–149.0)

## 2012-04-08 LAB — HEPATIC FUNCTION PANEL
Albumin: 4.2 g/dL (ref 3.5–5.2)
Bilirubin, Direct: 0 mg/dL (ref 0.0–0.3)
Total Protein: 7.1 g/dL (ref 6.0–8.3)

## 2012-04-17 ENCOUNTER — Encounter: Payer: 59 | Admitting: Family Medicine

## 2012-04-21 ENCOUNTER — Encounter: Payer: Self-pay | Admitting: Family Medicine

## 2012-04-21 ENCOUNTER — Ambulatory Visit (INDEPENDENT_AMBULATORY_CARE_PROVIDER_SITE_OTHER): Payer: 59 | Admitting: Family Medicine

## 2012-04-21 VITALS — BP 122/74 | HR 84 | Temp 98.8°F | Ht 68.25 in | Wt 196.5 lb

## 2012-04-21 DIAGNOSIS — Z8719 Personal history of other diseases of the digestive system: Secondary | ICD-10-CM

## 2012-04-21 DIAGNOSIS — Z0000 Encounter for general adult medical examination without abnormal findings: Secondary | ICD-10-CM

## 2012-04-21 NOTE — Patient Instructions (Addendum)
Call Dr. Marlane Hatcher office to see when repeat Colonoscopy is needed.

## 2012-04-21 NOTE — Progress Notes (Signed)
Nature conservation officer at [facility] [address] [place] [address] Phone: [phone] Fax: [phone]  Date:  04/21/2012   Name:  Logan Norris   DOB:  09-Apr-1961   MRN:  [id number] Gender: male Age: 51 y.o.  PCP:  Hannah Beat, MD    Chief Complaint: Annual Exam   History of Present Illness:  Logan Norris is a 51 y.o. pleasant patient who presents with the following:  CPX:  Colon - f/u with Dr. Ewing Schlein, check if due Flu - gets at work  Preventative Health Maintenance Visit:  Health Maintenance Summary Reviewed and updated, unless pt declines services.  Tobacco History Reviewed. Continues to smoke Alcohol: No concerns, no excessive use Exercise Habits: Some activity, not enough STD concerns: no risk or activity to increase risk Drug Use: None Encouraged self-testicular check  Labs reviewed with the patient.  Results for orders placed in visit on 04/08/12  LIPID PANEL      Component Value Range   Cholesterol 181  0 - 200 mg/dL   Triglycerides 027.2 (*) 0.0 - 149.0 mg/dL   HDL 53.66  >44.03 mg/dL   VLDL 47.4 (*) 0.0 - 25.9 mg/dL   Total CHOL/HDL Ratio 4    CBC WITH DIFFERENTIAL      Component Value Range   WBC 8.8  4.5 - 10.5 K/uL   RBC 4.64  4.22 - 5.81 Mil/uL   Hemoglobin 14.3  13.0 - 17.0 g/dL   HCT 56.3  87.5 - 64.3 %   MCV 91.7  78.0 - 100.0 fl   MCHC 33.5  30.0 - 36.0 g/dL   RDW 32.9  51.8 - 84.1 %   Platelets 270.0  150.0 - 400.0 K/uL   Neutrophils Relative 69.7  43.0 - 77.0 %   Lymphocytes Relative 20.5  12.0 - 46.0 %   Monocytes Relative 7.3  3.0 - 12.0 %   Eosinophils Relative 1.8  0.0 - 5.0 %   Basophils Relative 0.7  0.0 - 3.0 %   Neutro Abs 6.1  1.4 - 7.7 K/uL   Lymphs Abs 1.8  0.7 - 4.0 K/uL   Monocytes Absolute 0.6  0.1 - 1.0 K/uL   Eosinophils Absolute 0.2  0.0 - 0.7 K/uL   Basophils Absolute 0.1  0.0 - 0.1 K/uL  HEPATIC FUNCTION PANEL      Component Value Range   Total Bilirubin 0.4  0.3 - 1.2 mg/dL   Bilirubin, Direct 0.0  0.0 - 0.3 mg/dL   Alkaline Phosphatase 54  39 - 117 U/L   AST 19  0 - 37 U/L   ALT 18  0 - 53 U/L   Total Protein 7.1  6.0 - 8.3 g/dL   Albumin 4.2  3.5 - 5.2 g/dL  BASIC METABOLIC PANEL      Component Value Range   Sodium 139  135 - 145 mEq/L   Potassium 4.4  3.5 - 5.1 mEq/L   Chloride 99  96 - 112 mEq/L   CO2 31  19 - 32 mEq/L   Glucose, Bld 102 (*) 70 - 99 mg/dL   BUN 17  6 - 23 mg/dL   Creatinine, Ser 1.3  0.4 - 1.5 mg/dL   Calcium 9.5  8.4 - 66.0 mg/dL   GFR 63.01  >60.10 mL/min  LDL CHOLESTEROL, DIRECT      Component Value Range   Direct LDL 95.8       Patient Active Problem List  Diagnosis  .  HYPERLIPIDEMIA  . DEPRESSION  . HYPERTENSION  . GERD  . Personal history of other diseases of digestive disease  . Prostate cancer  . Anxiety  . Tachycardia  . Tobacco abuse    Past Medical History  Diagnosis Date  . HLD (hyperlipidemia)   . GERD (gastroesophageal reflux disease)   . Basal cell carcinoma   . Diverticulitis of colon   . Depression   . HTN (hypertension)   . H/O: gastrointestinal hemorrhage   . Anxiety 09/20/2011  . Tobacco abuse   . Prostate cancer 10/23/2010    Qualifier: Diagnosis of  By: Faryn Sieg MD, Karleen Hampshire    . Sinus tachycardia 04/13    Holter: NSR, rare PAcs and PVCs    Past Surgical History  Procedure Date  . Insertion prostate radiation seed     History  Substance Use Topics  . Smoking status: Current Every Day Smoker -- 1.0 packs/day for 35 years    Types: Cigarettes  . Smokeless tobacco: Never Used  . Alcohol Use: Yes     rare    Family History  Problem Relation Age of Onset  . Coronary artery disease Father     CABG x2  . Heart disease Father 10    CABG  . Stroke      Grandparent  . Breast cancer      Family history -1st degree relative <50  . Heart disease Paternal Grandfather     No Known Allergies  Medication list has been reviewed and updated.  Outpatient Prescriptions Prior to Visit    Medication Sig Dispense Refill  . atorvastatin (LIPITOR) 40 MG tablet TAKE ONE TABLET BY MOUTH AT BEDTIME  30 tablet  8  . citalopram (CELEXA) 40 MG tablet Take 1 tablet (40 mg total) by mouth daily.  30 tablet  3  . hydrochlorothiazide (HYDRODIURIL) 25 MG tablet TAKE ONE TABLET BY MOUTH IN THE MORNING  90 tablet  3  . pantoprazole (PROTONIX) 40 MG tablet TAKE 1 TABLET BY MOUTH EVERY DAY  90 tablet  9  . Tamsulosin HCl (FLOMAX) 0.4 MG CAPS Take 1 tablet by mouth Daily.      . clonazePAM (KLONOPIN) 0.5 MG tablet Take 1 tablet (0.5 mg total) by mouth 2 (two) times daily.  60 tablet  0  . clonazePAM (KLONOPIN) 0.5 MG tablet Take 1 tablet (0.5 mg total) by mouth 2 (two) times daily as needed for anxiety.  60 tablet  1  . clonazePAM (KLONOPIN) 0.5 MG tablet Take 0.5 mg by mouth 2 (two) times daily as needed.        Review of Systems:   General: Denies fever, chills, sweats. No significant weight loss. Eyes: Denies blurring,significant itching ENT: Denies earache, sore throat, and hoarseness. Cardiovascular: Denies chest pains, palpitations, dyspnea on exertion Respiratory: Denies cough, dyspnea at rest,wheeezing Breast: no concerns about lumps GI: Denies nausea, vomiting, diarrhea, constipation, change in bowel habits, abdominal pain, melena, hematochezia GU: Denies penile discharge, ABLE TO GET ERECTION POST SEEDS WITH VIAGRA OR CIALIS, NO urinary flow / outflow problems. No STD concerns. Musculoskeletal: Denies back pain, joint pain Derm: Denies rash, itching Neuro: Denies  paresthesias, frequent falls, frequent headaches Psych: Denies depression, anxiety Endocrine: Denies cold intolerance, heat intolerance, polydipsia Heme: Denies enlarged lymph nodes Allergy: No hayfever   Physical Examination: Filed Vitals:   04/21/12 1139  BP: 122/74  Pulse: 84  Temp: 98.8 F (37.1 C)   Filed Vitals:   04/21/12 1139  Height: 5' 8.25" (1.734  m)  Weight: 196 lb 8 oz (89.132 kg)   Body  mass index is 29.66 kg/(m^2). Ideal Body Weight: Weight in (lb) to have BMI = 25: 165.3   GEN: well developed, well nourished, no acute distress Eyes: conjunctiva and lids normal, PERRLA, EOMI ENT: TM clear, nares clear, oral exam WNL Neck: supple, no lymphadenopathy, no thyromegaly, no JVD Pulm: clear to auscultation and percussion, respiratory effort normal CV: regular rate and rhythm, S1-S2, no murmur, rub or gallop, no bruits, peripheral pulses normal and symmetric, no cyanosis, clubbing, edema or varicosities GI: soft, non-tender; no hepatosplenomegaly, masses; active bowel sounds all quadrants GU: defer Lymph: no cervical, axillary or inguinal adenopathy MSK: gait normal, muscle tone and strength WNL, no joint swelling, effusions, discoloration, crepitus  SKIN: clear, good turgor, color WNL, no rashes, lesions, or ulcerations Neuro: normal mental status, normal strength, sensation, and motion Psych: alert; oriented to person, place and time, normally interactive and not anxious or depressed in appearance.  Assessment and Plan:  1. Routine general medical examination at a health care facility    The patient's preventative maintenance and recommended screening tests for an annual wellness exam were reviewed in full today. Brought up to date unless services declined.  Counselled on the importance of diet, exercise, and its role in overall health and mortality. The patient's FH and SH was reviewed, including their home life, tobacco status, and drug and alcohol status.   Call Dr. Marlane Hatcher office to see when repeat Colonoscopy is needed.   Overall, doing well.  We discussed him potentially going off of his Celexa, but I know this patient quite well and am very hesitant to do that. He did successfully titrate off of his Klonopin. He is okay with that and will continue taking his SSRI  Orders Today:  No orders of the defined types were placed in this encounter.    Updated  Medication List: (Includes new medications, updates to list, dose adjustments) Outpatient Encounter Prescriptions as of 04/21/2012  Medication Sig Dispense Refill  . atorvastatin (LIPITOR) 40 MG tablet TAKE ONE TABLET BY MOUTH AT BEDTIME  30 tablet  8  . citalopram (CELEXA) 40 MG tablet Take 1 tablet (40 mg total) by mouth daily.  30 tablet  3  . hydrochlorothiazide (HYDRODIURIL) 25 MG tablet TAKE ONE TABLET BY MOUTH IN THE MORNING  90 tablet  3  . pantoprazole (PROTONIX) 40 MG tablet TAKE 1 TABLET BY MOUTH EVERY DAY  90 tablet  9  . Tamsulosin HCl (FLOMAX) 0.4 MG CAPS Take 1 tablet by mouth Daily.      . clonazePAM (KLONOPIN) 0.5 MG tablet Take 1 tablet (0.5 mg total) by mouth 2 (two) times daily.  60 tablet  0  . clonazePAM (KLONOPIN) 0.5 MG tablet Take 1 tablet (0.5 mg total) by mouth 2 (two) times daily as needed for anxiety.  60 tablet  1  . DISCONTD: clonazePAM (KLONOPIN) 0.5 MG tablet Take 0.5 mg by mouth 2 (two) times daily as needed.        Medications Discontinued: Medications Discontinued During This Encounter  Medication Reason  . clonazePAM (KLONOPIN) 0.5 MG tablet Discontinued by provider     Hannah Beat, MD,

## 2012-04-22 ENCOUNTER — Encounter: Payer: Self-pay | Admitting: Family Medicine

## 2012-08-19 ENCOUNTER — Other Ambulatory Visit: Payer: Self-pay | Admitting: Family Medicine

## 2012-09-25 ENCOUNTER — Other Ambulatory Visit: Payer: Self-pay | Admitting: Family Medicine

## 2012-09-30 ENCOUNTER — Ambulatory Visit (INDEPENDENT_AMBULATORY_CARE_PROVIDER_SITE_OTHER): Payer: 59 | Admitting: Family Medicine

## 2012-09-30 ENCOUNTER — Encounter: Payer: Self-pay | Admitting: Family Medicine

## 2012-09-30 VITALS — BP 120/84 | HR 101 | Temp 98.6°F | Ht 68.25 in | Wt 207.5 lb

## 2012-09-30 NOTE — Progress Notes (Signed)
  Subjective:    Patient ID: Logan Norris, male    DOB: Jan 17, 1961, 52 y.o.   MRN: [id number]  HPI  52 year old male pt of Dr. Cyndie Chime with HTN, high cholesterol and depression.Marland Kitchen He reports several days ago an episode on pain in left occiput, no radiation. describes as dull ache, has been constant since.   Pain in worse with moving neck.  No pain in arm or numbness, weakness in left arm. No fever. No known injury.  No associated nausea, vomiting, no vision  Changes, no neurologic changes.  No CP, no SOB.  No checked BP at home lately.   Currently has some sinus congestion and pressure.  Has treated with heating pad.  Wife has given him OTC pain reliever, helped minimally.  Depression and anxiety: well controlled on celexa.   Review of Systems  Constitutional: Negative for fever and fatigue.  HENT: Negative for ear pain.   Eyes: Positive for pain.  Respiratory: Negative for shortness of breath and wheezing.   Cardiovascular: Negative for chest pain, palpitations and leg swelling.  Gastrointestinal: Negative for abdominal pain.  Genitourinary: Negative for dysuria.  Neurological: Negative for syncope, light-headedness and numbness.       Objective:   Physical Exam  Constitutional: He is oriented to person, place, and time. Vital signs are normal. He appears well-developed and well-nourished.  HENT:  Head: Normocephalic.    Right Ear: Hearing normal.  Left Ear: Hearing normal.  Nose: Nose normal.  Mouth/Throat: Oropharynx is clear and moist and mucous membranes are normal.  No ttp, pain with movement in neck in left occiput, no mass  neg Spurling's  Neck: Trachea normal. Carotid bruit is not present. No mass and no thyromegaly present.  Cardiovascular: Normal rate, regular rhythm and normal pulses.  Exam reveals no gallop, no distant heart sounds and no friction rub.   No murmur heard. No peripheral edema  Pulmonary/Chest: Effort normal and breath sounds  normal. No respiratory distress.  Musculoskeletal:       Cervical back: He exhibits normal range of motion, no tenderness, no bony tenderness and no swelling.  Neurological: He is oriented to person, place, and time. He has normal reflexes. No cranial nerve deficit or sensory deficit. He displays a negative Romberg sign. Gait normal.  Skin: Skin is warm, dry and intact. No rash noted.  Psychiatric: He has a normal mood and affect. His speech is normal and behavior is normal. Thought content normal.          Assessment & Plan:

## 2012-09-30 NOTE — Assessment & Plan Note (Signed)
Tenderness is rightat insertion of trapezius muscle and pain increases with head movement.  no sign of cervical radiculopathy, ? Possible vertebral arthritis.   no sign of increased intracranial pressure and nml neuro exam.  No red flags.   Most likely MSK strain... NSAIDs contraindicated given bleeding PUD in past.  Start with heat  And ROM exercsie.  Follow up in not bettter in 2 weeks.

## 2012-09-30 NOTE — Patient Instructions (Addendum)
No sign of intracranial pressure increase. Most likely due to musculoskeletal injury. Treat with heat and gentle range of motion stretches. Tylenol prn pain. If not improving in 2 weeks follow up with PCP.  Call sooner if new neurologic changes or radiation of pain to arm, etc.

## 2012-10-01 ENCOUNTER — Other Ambulatory Visit: Payer: Self-pay | Admitting: Family Medicine

## 2013-01-21 ENCOUNTER — Encounter: Payer: Self-pay | Admitting: Family Medicine

## 2013-01-21 ENCOUNTER — Ambulatory Visit (INDEPENDENT_AMBULATORY_CARE_PROVIDER_SITE_OTHER): Payer: PRIVATE HEALTH INSURANCE | Admitting: Family Medicine

## 2013-01-21 VITALS — BP 118/78 | HR 80 | Temp 98.5°F | Wt 207.5 lb

## 2013-01-21 DIAGNOSIS — W57XXXA Bitten or stung by nonvenomous insect and other nonvenomous arthropods, initial encounter: Secondary | ICD-10-CM

## 2013-01-21 DIAGNOSIS — F329 Major depressive disorder, single episode, unspecified: Secondary | ICD-10-CM

## 2013-01-21 NOTE — Progress Notes (Signed)
Nature conservation officer at [facility] [address] [place] [address] Phone: [phone] Fax: [phone]  Date:  01/21/2013   Name:  William Schake   DOB:  1960-12-31   MRN:  [id number] Gender: male Age: 52 y.o.  Primary Physician:  Hannah Beat, MD  Evaluating MD: Hannah Beat, MD   Chief Complaint: tick bite right arm   History of Present Illness:  Nima Marcelo Ickes is a 52 y.o. pleasant patient who presents with the following:  R arm, tick bite a few weeks ago. Now with scar in that area.   Also wanted to ? If getting off prozac a god idea  Patient Active Problem List   Diagnosis Date Noted  . Bleeding ulcer, hx of 09/30/2012  . Occipital pain 09/30/2012  . Tobacco abuse   . Anxiety 09/20/2011  . Prostate cancer 10/23/2010  . HYPERTENSION 02/09/2010  . DEPRESSION 12/21/2009  . HYPERLIPIDEMIA 01/19/2009  . GERD 01/19/2009  . Hx of diverticulitis 01/19/2009    Past Medical History  Diagnosis Date  . HLD (hyperlipidemia)   . GERD (gastroesophageal reflux disease)   . Basal cell carcinoma   . Diverticulitis of colon   . Depression   . HTN (hypertension)   . H/O: gastrointestinal hemorrhage   . Anxiety 09/20/2011  . Tobacco abuse   . Prostate cancer 10/23/2010    Qualifier: Diagnosis of  By: Darinda Stuteville MD, Karleen Hampshire    . Sinus tachycardia 04/13    Holter: NSR, rare PAcs and PVCs  . Hx of diverticulitis 01/19/2009    Centricity Description: DIVERTICULITIS, HX OF Qualifier: Diagnosis of  By: Patsy Lager MD, Kenzly Rogoff   Centricity Description: GASTROINTESTINAL HEMORRHAGE, HX OF Qualifier: Diagnosis of  By: Patsy Lager MD, Karleen Hampshire      Past Surgical History  Procedure Laterality Date  . Insertion prostate radiation seed      History   Social History  . Marital Status: Married    Spouse Name: N/A    Number of Children: N/A  . Years of Education: N/A   Occupational History  . [employer]    Social History Main Topics    . Smoking status: Current Every Day Smoker -- 1.00 packs/day for 35 years    Types: Cigarettes  . Smokeless tobacco: Never Used  . Alcohol Use: Yes     Comment: rare  . Drug Use: No  . Sexually Active: Not on file   Other Topics Concern  . Not on file   Social History Narrative  . No narrative on file    Family History  Problem Relation Age of Onset  . Coronary artery disease Father     CABG x2  . Heart disease Father 25    CABG  . Stroke      Grandparent  . Breast cancer      Family history -1st degree relative <50  . Heart disease Paternal Grandfather     No Known Allergies  Medication list has been reviewed and updated.  Outpatient Prescriptions Prior to Visit  Medication Sig Dispense Refill  . atorvastatin (LIPITOR) 40 MG tablet TAKE ONE TABLET BY MOUTH AT BEDTIME  30 tablet  7  . citalopram (CELEXA) 40 MG tablet TAKE ONE TABLET BY MOUTH ONE TIME DAILY  30 tablet  2  . hydrochlorothiazide (HYDRODIURIL) 25 MG tablet TAKE ONE TABLET BY MOUTH IN THE MORNING  90 tablet  3  . pantoprazole (PROTONIX) 40 MG tablet TAKE 1 TABLET BY MOUTH EVERY  DAY  90 tablet  8  . Tamsulosin HCl (FLOMAX) 0.4 MG CAPS Take 1 tablet by mouth Daily.       No facility-administered medications prior to visit.    Review of Systems:   GEN: No acute illnesses, no fevers, chills. GI: No n/v/d, eating normally Pulm: No SOB Interactive and getting along well at home.  Otherwise, ROS is as per the HPI.   Physical Examination: BP 118/78  Pulse 80  Temp(Src) 98.5 F (36.9 C) (Oral)  Wt 207 lb 8 oz (94.121 kg)  BMI 31.3 kg/m2  Ideal Body Weight:     GEN: WDWN, NAD, Non-toxic, Alert & Oriented x 3 HEENT: Atraumatic, Normocephalic.  Ears and Nose: No external deformity. EXTR: No clubbing/cyanosis/edema NEURO: Normal gait.  PSYCH: Normally interactive. Conversant. Not depressed or anxious appearing.  Calm demeanor.  SKIN: R upper arm, 7 mm area, elevated and a little  hard.  Assessment and Plan:  Tick bite  DEPRESSION  Reassured, some scar formation. No h/o systemic symptoms. Advised given review of his history, i would stay on prozac.  Orders Today:  No orders of the defined types were placed in this encounter.    Updated Medication List: (Includes new medications, updates to list, dose adjustments) No orders of the defined types were placed in this encounter.    Medications Discontinued: There are no discontinued medications.    Signed, Elpidio Galea. Kemari Mares, MD 01/21/2013 8:31 AM

## 2013-02-25 ENCOUNTER — Other Ambulatory Visit: Payer: Self-pay

## 2013-02-25 MED ORDER — CITALOPRAM HYDROBROMIDE 40 MG PO TABS: ORAL_TABLET | ORAL | Status: DC

## 2013-02-25 NOTE — Telephone Encounter (Signed)
Pts wife request citalopram # 90 x0 sent to [facility] pharmacy.advised done.

## 2013-02-27 ENCOUNTER — Other Ambulatory Visit: Payer: Self-pay

## 2013-02-27 MED ORDER — HYDROCHLOROTHIAZIDE 25 MG PO TABS: ORAL_TABLET | ORAL | Status: DC

## 2013-02-27 NOTE — Telephone Encounter (Signed)
Pt wife request refill HCTZ to [facility] pharmacy.advised done.

## 2013-05-27 IMAGING — CR DG CHEST 2V
2 series · 2 of 2 positions shown · non-contrast
Comparison: None.

CLINICAL DATA: Preop for surgery for prostate carcinoma, smoking
history

CHEST - 2 VIEW

[w chest pa]
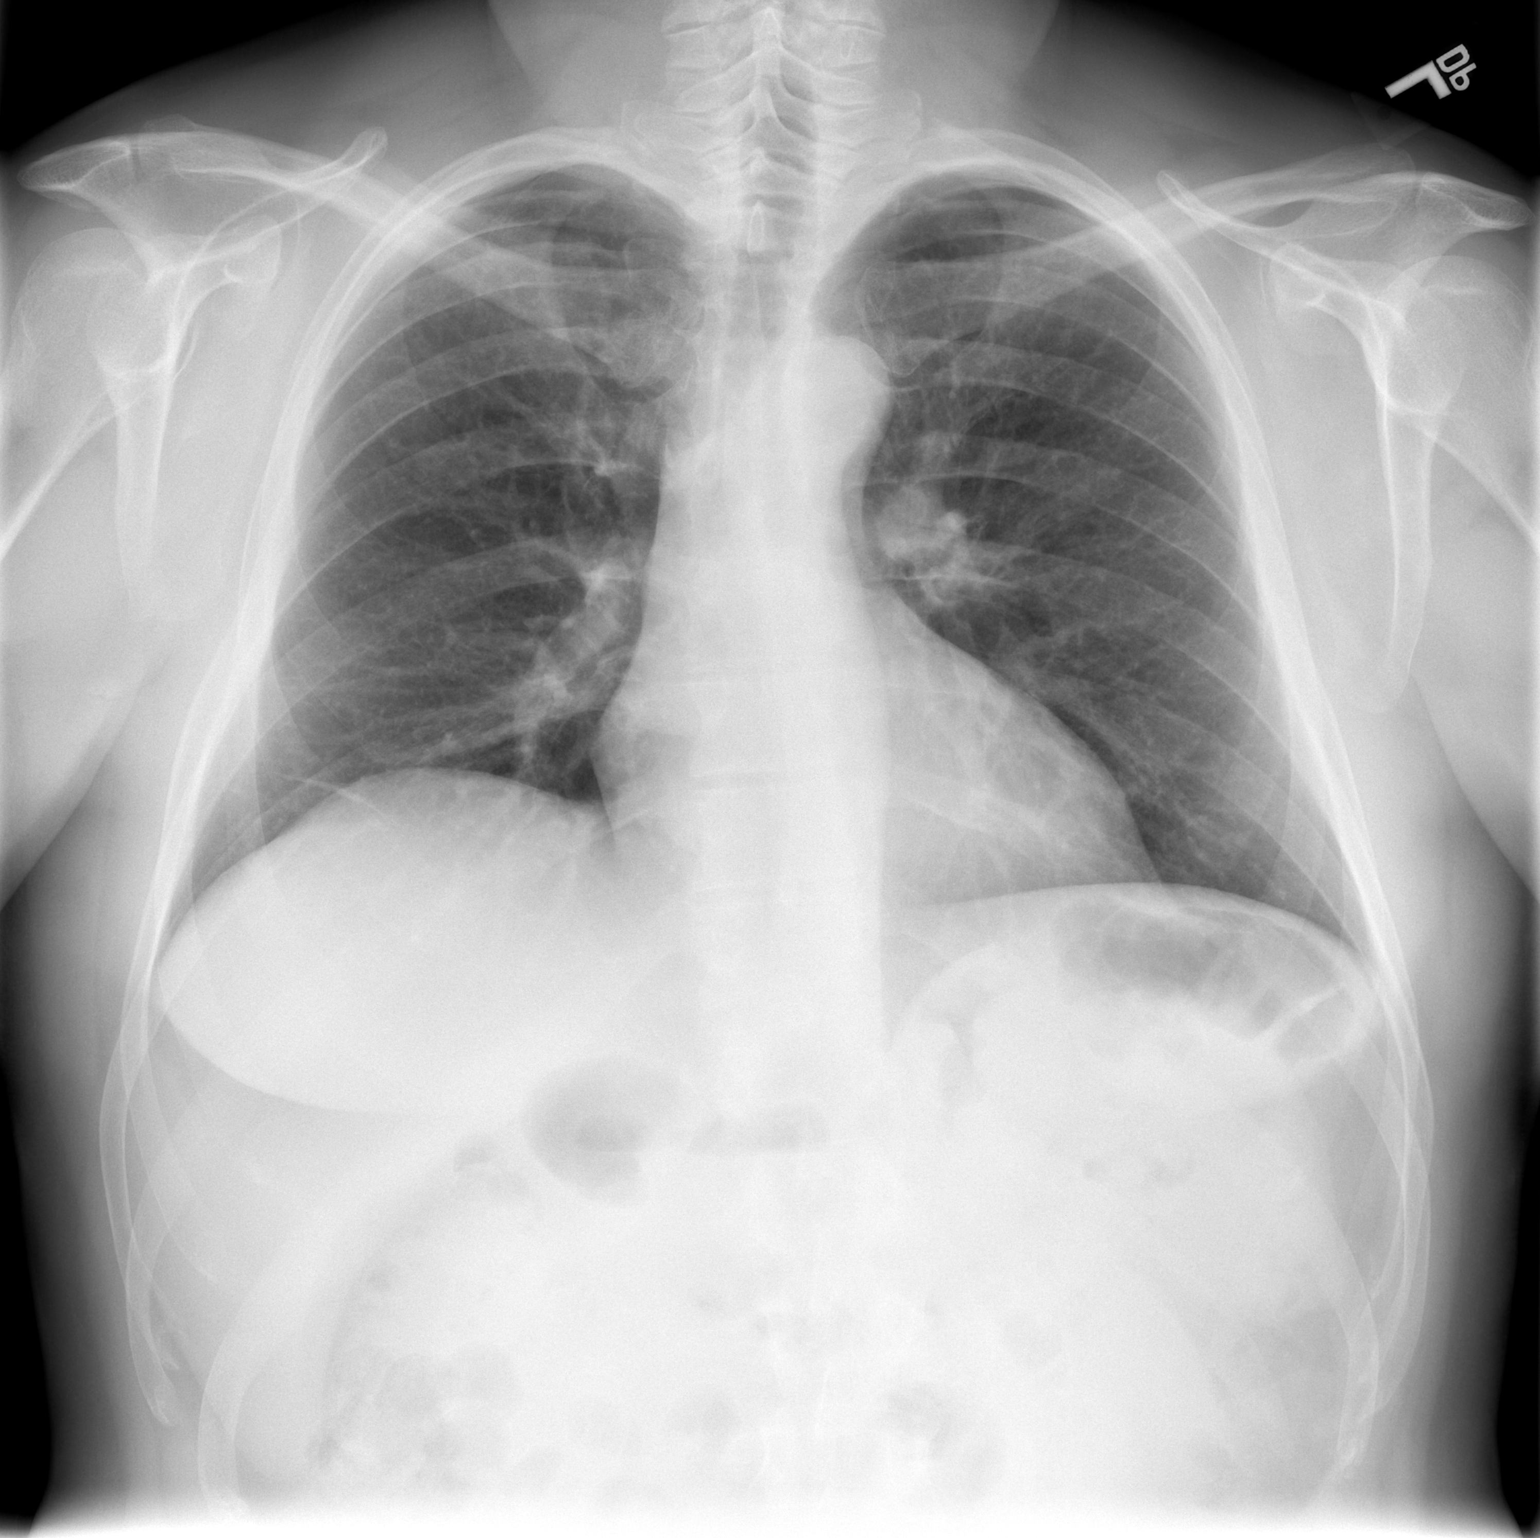

[w chest lat]
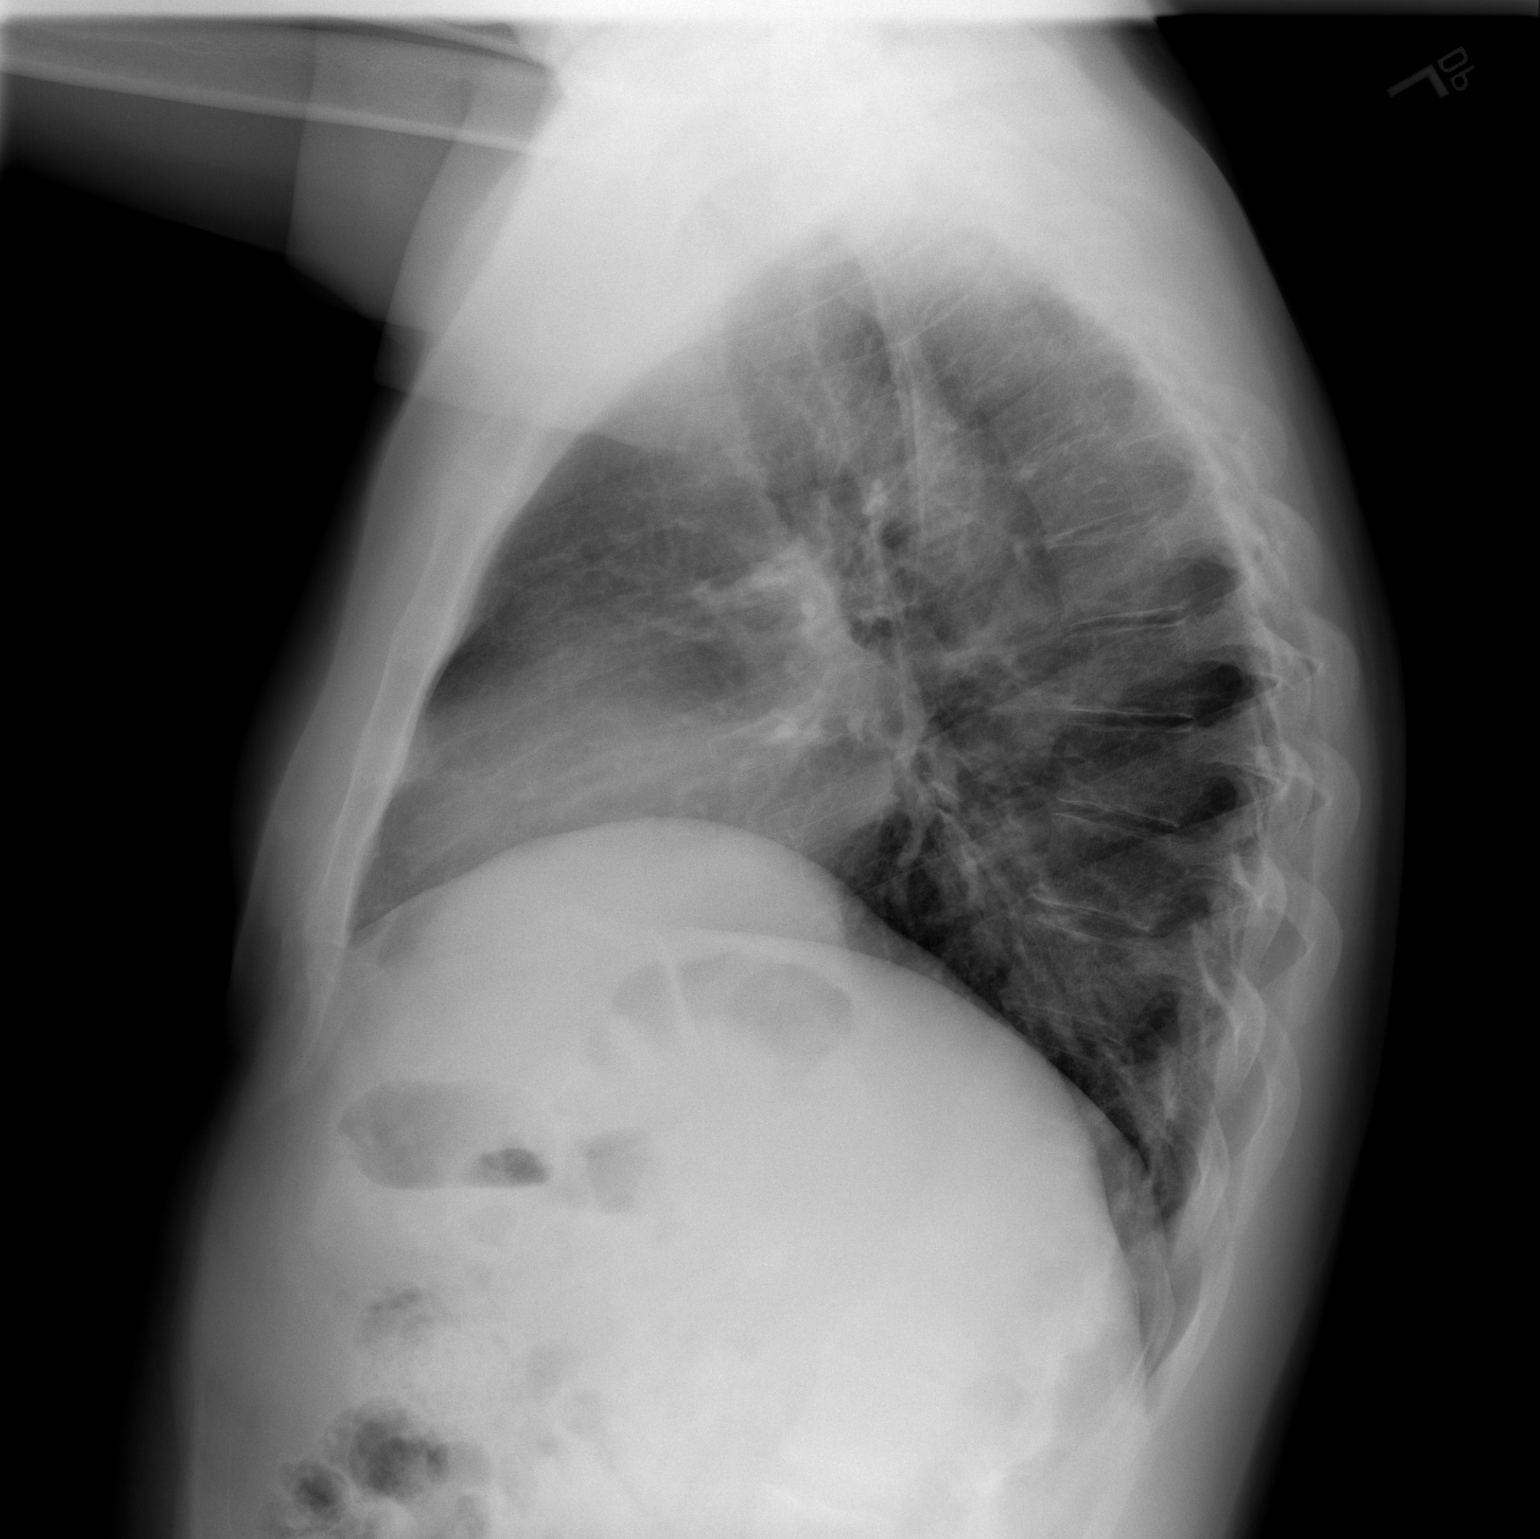

[2 of 2 positions shown; findings below may reference images not displayed]

FINDINGS: Linear atelectasis or scarring is noted at the right lung
base.  No active infiltrate or effusion is seen.  Mediastinal
contours appear normal.  The heart is within upper limits of
normal.  No bony abnormality is seen.
IMPRESSION: No active lung disease.

## 2013-07-13 ENCOUNTER — Encounter: Payer: Self-pay | Admitting: Family Medicine

## 2013-07-13 ENCOUNTER — Ambulatory Visit (INDEPENDENT_AMBULATORY_CARE_PROVIDER_SITE_OTHER): Payer: BC Managed Care – PPO | Admitting: Family Medicine

## 2013-07-13 VITALS — BP 126/80 | HR 79 | Temp 98.1°F | Ht 68.25 in | Wt 206.2 lb

## 2013-07-13 DIAGNOSIS — F17200 Nicotine dependence, unspecified, uncomplicated: Secondary | ICD-10-CM

## 2013-07-13 DIAGNOSIS — Z720 Tobacco use: Secondary | ICD-10-CM

## 2013-07-13 MED ORDER — VARENICLINE TARTRATE 0.5 MG X 11 & 1 MG X 42 PO MISC: ORAL | Status: DC

## 2013-07-13 MED ORDER — VARENICLINE TARTRATE 1 MG PO TABS: 1.0000 mg | ORAL_TABLET | Freq: Two times a day (BID) | ORAL | Status: DC

## 2013-07-13 NOTE — Progress Notes (Signed)
Pre-visit discussion using our clinic review tool. No additional management support is needed unless otherwise documented below in the visit note.  

## 2013-07-13 NOTE — Progress Notes (Signed)
Date:  07/13/2013   Name:  Logan Norris   DOB:  Dec 30, 1960   MRN:  [id number] Gender: male Age: 52 y.o.  Primary Physician:  Hannah Beat, MD   Chief Complaint: Wants to Stop Smoking   Subjective:   History of Present Illness:  Logan Norris is a 52 y.o. very pleasant male patient who presents with the following:  1 and 1/2 packs per day.  Father, has lost 40+ pounds. Ready to quit smoking  Past Medical History, Surgical History, Social History, Family History, Problem List, Medications, and Allergies have been reviewed and updated if relevant.  Review of Systems:  GEN: No acute illnesses, no fevers, chills. GI: No n/v/d, eating normally Pulm: No SOB Interactive and getting along well at home.  Otherwise, ROS is as per the HPI.  Objective:   Physical Examination: BP 126/80  Pulse 79  Temp(Src) 98.1 F (36.7 C) (Oral)  Ht 5' 8.25" (1.734 m)  Wt 206 lb 4 oz (93.554 kg)  BMI 31.11 kg/m2   GEN: WDWN, NAD, Non-toxic, A & O x 3 HEENT: Atraumatic, Normocephalic. Neck supple. No masses, No LAD. Ears and Nose: No external deformity. CV: RRR, No M/G/R. No JVD. No thrill. No extra heart sounds. PULM: CTA B, no wheezes, crackles, rhonchi. No retractions. No resp. distress. No accessory muscle use. EXTR: No c/c/e NEURO Normal gait.  PSYCH: Normally interactive. Conversant. Not depressed or anxious appearing.  Calm demeanor.   Laboratory and Imaging Data:  Assessment & Plan:    Tobacco abuse  Chantix, consider wean off SSRI when off cigs  There are no Patient Instructions on file for this visit.  Orders Today:  No orders of the defined types were placed in this encounter.    New medications, updates to list, dose adjustments: Meds ordered this encounter  Medications  . varenicline (CHANTIX STARTING MONTH PAK) 0.5 MG X 11 & 1 MG X 42 tablet    Sig: Take one 0.5mg  tablet by mouth once daily for 3 days, then increase to one 0.5mg  tablet twice  daily for 3 days, then increase to one 1mg  tablet twice daily.    Dispense:  53 tablet    Refill:  0  . varenicline (CHANTIX) 1 MG tablet    Sig: Take 1 tablet (1 mg total) by mouth 2 (two) times daily. HOLD FOR REFILLS AFTER START PACK    Dispense:  60 tablet    Refill:  3    Signed,  Yanelle Sousa T. Braxtyn Bojarski, MD, CAQ Sports Medicine  [facility] at [facility] [address] [place] [address] Phone: [phone] Fax: [phone]  Updated Complete Medication List:   Medication List       This list is accurate as of: 07/13/13  8:29 AM.  Always use your most recent med list.               atorvastatin 40 MG tablet  Commonly known as:  LIPITOR  TAKE ONE TABLET BY MOUTH AT BEDTIME     citalopram 40 MG tablet  Commonly known as:  CELEXA  TAKE ONE TABLET BY MOUTH ONE TIME DAILY     hydrochlorothiazide 25 MG tablet  Commonly known as:  HYDRODIURIL  TAKE ONE TABLET BY MOUTH IN THE MORNING     pantoprazole 40 MG tablet  Commonly known as:  PROTONIX  TAKE 1 TABLET BY MOUTH EVERY DAY     tamsulosin 0.4 MG Caps capsule  Commonly known as:  FLOMAX  Take 1 tablet by mouth Daily.     varenicline 0.5 MG X 11 & 1 MG X 42 tablet  Commonly known as:  CHANTIX STARTING MONTH PAK  Take one 0.5mg  tablet by mouth once daily for 3 days, then increase to one 0.5mg  tablet twice daily for 3 days, then increase to one 1mg  tablet twice daily.     varenicline 1 MG tablet  Commonly known as:  CHANTIX  Take 1 tablet (1 mg total) by mouth 2 (two) times daily. HOLD FOR REFILLS AFTER START PACK

## 2013-08-07 ENCOUNTER — Other Ambulatory Visit: Payer: Self-pay | Admitting: Family Medicine

## 2013-08-07 DIAGNOSIS — Z79899 Other long term (current) drug therapy: Secondary | ICD-10-CM

## 2013-08-07 DIAGNOSIS — E785 Hyperlipidemia, unspecified: Secondary | ICD-10-CM

## 2013-08-13 ENCOUNTER — Other Ambulatory Visit: Payer: Self-pay | Admitting: *Deleted

## 2013-08-13 MED ORDER — CITALOPRAM HYDROBROMIDE 40 MG PO TABS: ORAL_TABLET | ORAL | Status: DC

## 2013-08-14 ENCOUNTER — Other Ambulatory Visit (INDEPENDENT_AMBULATORY_CARE_PROVIDER_SITE_OTHER): Payer: BC Managed Care – PPO

## 2013-08-14 DIAGNOSIS — Z720 Tobacco use: Secondary | ICD-10-CM

## 2013-08-14 DIAGNOSIS — I10 Essential (primary) hypertension: Secondary | ICD-10-CM

## 2013-08-14 DIAGNOSIS — F17200 Nicotine dependence, unspecified, uncomplicated: Secondary | ICD-10-CM

## 2013-08-14 DIAGNOSIS — E785 Hyperlipidemia, unspecified: Secondary | ICD-10-CM

## 2013-08-14 DIAGNOSIS — Z79899 Other long term (current) drug therapy: Secondary | ICD-10-CM

## 2013-08-14 LAB — CBC WITH DIFFERENTIAL/PLATELET
BASOS ABS: 0.1 10*3/uL (ref 0.0–0.1)
Basophils Relative: 0.7 % (ref 0.0–3.0)
Eosinophils Absolute: 0.1 10*3/uL (ref 0.0–0.7)
Eosinophils Relative: 1.6 % (ref 0.0–5.0)
HEMATOCRIT: 44.7 % (ref 39.0–52.0)
Hemoglobin: 15.3 g/dL (ref 13.0–17.0)
LYMPHS ABS: 2 10*3/uL (ref 0.7–4.0)
Lymphocytes Relative: 23.6 % (ref 12.0–46.0)
MCHC: 34.1 g/dL (ref 30.0–36.0)
MCV: 90.9 fl (ref 78.0–100.0)
MONO ABS: 0.6 10*3/uL (ref 0.1–1.0)
Monocytes Relative: 7.6 % (ref 3.0–12.0)
Neutro Abs: 5.5 10*3/uL (ref 1.4–7.7)
Neutrophils Relative %: 66.5 % (ref 43.0–77.0)
PLATELETS: 282 10*3/uL (ref 150.0–400.0)
RBC: 4.92 Mil/uL (ref 4.22–5.81)
RDW: 13.7 % (ref 11.5–14.6)
WBC: 8.3 10*3/uL (ref 4.5–10.5)

## 2013-08-14 LAB — HEPATIC FUNCTION PANEL
ALBUMIN: 4.5 g/dL (ref 3.5–5.2)
ALT: 22 U/L (ref 0–53)
AST: 18 U/L (ref 0–37)
Alkaline Phosphatase: 49 U/L (ref 39–117)
Bilirubin, Direct: 0 mg/dL (ref 0.0–0.3)
Total Bilirubin: 0.8 mg/dL (ref 0.3–1.2)
Total Protein: 7.3 g/dL (ref 6.0–8.3)

## 2013-08-14 LAB — BASIC METABOLIC PANEL
BUN: 18 mg/dL (ref 6–23)
CHLORIDE: 102 meq/L (ref 96–112)
CO2: 30 mEq/L (ref 19–32)
Calcium: 9.7 mg/dL (ref 8.4–10.5)
Creatinine, Ser: 1.2 mg/dL (ref 0.4–1.5)
GFR: 65.08 mL/min (ref >60.00)
GLUCOSE: 109 mg/dL — AB (ref 70–99)
POTASSIUM: 4.4 meq/L (ref 3.5–5.1)
Sodium: 139 mEq/L (ref 135–145)

## 2013-08-14 LAB — LDL CHOLESTEROL, DIRECT: Direct LDL: 141.8 mg/dL

## 2013-08-14 LAB — LIPID PANEL
CHOL/HDL RATIO: 5
CHOLESTEROL: 222 mg/dL — AB (ref 0–200)
HDL: 44.6 mg/dL (ref >39.00)
TRIGLYCERIDES: 208 mg/dL — AB (ref 0.0–149.0)
VLDL: 41.6 mg/dL — AB (ref 0.0–40.0)

## 2013-08-17 ENCOUNTER — Ambulatory Visit (INDEPENDENT_AMBULATORY_CARE_PROVIDER_SITE_OTHER): Payer: BC Managed Care – PPO | Admitting: Family Medicine

## 2013-08-17 VITALS — BP 116/76 | HR 77 | Temp 97.3°F | Ht 68.5 in | Wt 207.5 lb

## 2013-08-17 DIAGNOSIS — L989 Disorder of the skin and subcutaneous tissue, unspecified: Secondary | ICD-10-CM

## 2013-08-17 DIAGNOSIS — E669 Obesity, unspecified: Secondary | ICD-10-CM | POA: Insufficient documentation

## 2013-08-17 DIAGNOSIS — Z0000 Encounter for general adult medical examination without abnormal findings: Secondary | ICD-10-CM

## 2013-08-17 DIAGNOSIS — Z720 Tobacco use: Secondary | ICD-10-CM

## 2013-08-17 DIAGNOSIS — F17200 Nicotine dependence, unspecified, uncomplicated: Secondary | ICD-10-CM

## 2013-08-17 MED ORDER — CITALOPRAM HYDROBROMIDE 40 MG PO TABS: ORAL_TABLET | ORAL | Status: DC

## 2013-08-17 NOTE — Progress Notes (Signed)
Date:  08/17/2013   Name:  Logan Norris   DOB:  August 26, 1960   MRN:  [id number] Gender: male Age: 53 y.o.  Primary Physician:  Owens Loffler, MD   Chief Complaint: Annual Exam   Subjective:   History of Present Illness:  Logan Norris is a 53 y.o. pleasant patient who presents with the following:  Preventative Health Maintenance Visit:  Health Maintenance Summary Reviewed and updated, unless pt declines services.  Tobacco History Reviewed. Alcohol: No concerns, no excessive use Exercise Habits: Some activity, rec at least 30 mins 5 times a week STD concerns: no risk or activity to increase risk Drug Use: None Encouraged self-testicular check  He has been doing a poor job with his diet. He is also not been exercising as much.  He also has 2 lesions on his nose and one on the floor had that he is concerned about.  Additionally talked some about libido issues and intercourse.  Health Maintenance  Topic Date Due  . Colonoscopy  03/27/2009  . Influenza Vaccine  03/06/2013  . Tetanus/tdap  08/07/2015    Immunization History  Administered Date(s) Administered  . Influenza Whole 06/06/2010  . Td 08/06/2005    Patient Active Problem List   Diagnosis Date Noted  . Obesity (BMI 30-39.9) 08/17/2013  . Bleeding ulcer, hx of 09/30/2012  . Tobacco abuse   . Anxiety 09/20/2011  . Prostate cancer 10/23/2010  . HYPERTENSION 02/09/2010  . DEPRESSION 12/21/2009  . HYPERLIPIDEMIA 01/19/2009  . GERD 01/19/2009  . Hx of diverticulitis 01/19/2009    Past Medical History  Diagnosis Date  . HLD (hyperlipidemia)   . GERD (gastroesophageal reflux disease)   . Basal cell carcinoma   . Diverticulitis of colon   . Depression   . HTN (hypertension)   . H/O: gastrointestinal hemorrhage   . Anxiety 09/20/2011  . Tobacco abuse   . Prostate cancer 10/23/2010    Qualifier: Diagnosis of  By: Rylie Knierim MD, Frederico Hamman    . Sinus tachycardia 04/13    Holter: NSR, rare PAcs and  PVCs  . Hx of diverticulitis 01/19/2009    Centricity Description: DIVERTICULITIS, HX OF Qualifier: Diagnosis of  By: Lorelei Pont MD, Anya Murphey   Centricity Description: GASTROINTESTINAL HEMORRHAGE, HX OF Qualifier: Diagnosis of  By: Lorelei Pont MD, Frederico Hamman      Past Surgical History  Procedure Laterality Date  . Insertion prostate radiation seed      History   Social History  . Marital Status: Married    Spouse Name: N/A    Number of Children: N/A  . Years of Education: N/A   Occupational History  . [employer]    Social History Main Topics  . Smoking status: Current Every Day Smoker -- 1.00 packs/day for 35 years    Types: Cigarettes  . Smokeless tobacco: Never Used  . Alcohol Use: Yes     Comment: rare  . Drug Use: No  . Sexual Activity: Not on file   Other Topics Concern  . Not on file   Social History Narrative  . No narrative on file    Family History  Problem Relation Age of Onset  . Coronary artery disease Father     CABG x2  . Heart disease Father 15    CABG  . Stroke      Grandparent  . Breast cancer      Family history -1st degree relative <50  . Heart disease Paternal Grandfather  No Known Allergies  Medication list has been reviewed and updated.  Review of Systems:  General: Denies fever, chills, sweats. No significant weight loss. Eyes: Denies blurring,significant itching ENT: Denies earache, sore throat, and hoarseness. Cardiovascular: Denies chest pains, palpitations, dyspnea on exertion Respiratory: Denies cough, dyspnea at rest,wheeezing Breast: no concerns about lumps GI: Denies nausea, vomiting, diarrhea, constipation, change in bowel habits, abdominal pain, melena, hematochezia GU: Denies penile discharge, ED, urinary flow / outflow problems. No STD concerns. Musculoskeletal: Denies back pain, joint pain Derm: Denies rash, itching Neuro: Denies  paresthesias, frequent falls, frequent headaches Psych:  Denies depression, anxiety Endocrine: Denies cold intolerance, heat intolerance, polydipsia Heme: Denies enlarged lymph nodes Allergy: No hayfever  Objective:   Physical Examination: BP 116/76  Pulse 77  Temp(Src) 97.3 F (36.3 C) (Oral)  Ht 5' 8.5" (1.74 m)  Wt 207 lb 8 oz (94.121 kg)  BMI 31.09 kg/m2 Ideal Body Weight: Weight in (lb) to have BMI = 25: 166.5  GEN: well developed, well nourished, no acute distress Eyes: conjunctiva and lids normal, PERRLA, EOMI ENT: TM clear, nares clear, oral exam WNL Neck: supple, no lymphadenopathy, no thyromegaly, no JVD Pulm: clear to auscultation and percussion, respiratory effort normal CV: regular rate and rhythm, S1-S2, no murmur, rub or gallop, no bruits, peripheral pulses normal and symmetric, no cyanosis, clubbing, edema or varicosities GI: soft, non-tender; no hepatosplenomegaly, masses; active bowel sounds all quadrants GU: no hernia, testicular mass, penile discharge Lymph: no cervical, axillary or inguinal adenopathy MSK: gait normal, muscle tone and strength WNL, no joint swelling, effusions, discoloration, crepitus  SKIN: multiple seb k's, concerning 1 cm lesion on L forehead, some recent bleeding. 2 r nares Neuro: normal mental status, normal strength, sensation, and motion Psych: alert; oriented to person, place and time, normally interactive and not anxious or depressed in appearance.  All labs reviewed with patient.  Lipids:    Component Value Date/Time   CHOL 222* 08/14/2013 0853   TRIG 208.0* 08/14/2013 0853   HDL 44.60 08/14/2013 0853   LDLDIRECT 141.8 08/14/2013 0853   VLDL 41.6* 08/14/2013 0853   CHOLHDL 5 08/14/2013 0853    CBC:    Component Value Date/Time   WBC 8.3 08/14/2013 0853   HGB 15.3 08/14/2013 0853   HCT 44.7 08/14/2013 0853   PLT 282.0 08/14/2013 0853   MCV 90.9 08/14/2013 0853   NEUTROABS 5.5 08/14/2013 0853   LYMPHSABS 2.0 08/14/2013 0853   MONOABS 0.6 08/14/2013 0853   EOSABS 0.1 08/14/2013 0853   BASOSABS 0.1  08/14/2013 2951    Basic Metabolic Panel:    Component Value Date/Time   NA 139 08/14/2013 0853   K 4.4 08/14/2013 0853   CL 102 08/14/2013 0853   CO2 30 08/14/2013 0853   BUN 18 08/14/2013 0853   CREATININE 1.2 08/14/2013 0853   GLUCOSE 109* 08/14/2013 0853   CALCIUM 9.7 08/14/2013 0853    Lab Results  Component Value Date   ALT 22 08/14/2013   AST 18 08/14/2013   ALKPHOS 49 08/14/2013   BILITOT 0.8 08/14/2013    Lab Results  Component Value Date   TSH 2.06 07/26/2010    Assessment & Plan:   Health Maintenance Exam: The patient's preventative maintenance and recommended screening tests for an annual wellness exam were reviewed in full today. Brought up to date unless services declined.  Counselled on the importance of diet, exercise, and its role in overall health and mortality. The patient's FH and SH was reviewed, including  their home life, tobacco status, and drug and alcohol status.   Routine general medical examination at a health care facility  Skin lesion - Plan: Ambulatory referral to Dermatology  Tobacco abuse  Oveall, doing well. Work on diet, exercise and weight loss.  5 minutes spent in counselling: The patient does smoke cigarettes - quitting, just started chantix, and we discussed that tobacco is harmful to one's overall health, and I made the recommendation to quit tobacco products.   Concern for ? BCC vs SCC on forehead.  Patient Instructions  REFERRAL: GO THE THE FRONT ROOM AT THE ENTRANCE OF OUR CLINIC, NEAR CHECK IN. ASK FOR MARION. SHE WILL HELP YOU SET UP YOUR REFERRAL. DATE: TIME:    Orders Placed This Encounter  Procedures  . Ambulatory referral to Dermatology    New medications, updates to list, dose adjustments: Meds ordered this encounter  Medications  . citalopram (CELEXA) 40 MG tablet    Sig: TAKE ONE TABLET BY MOUTH ONE TIME DAILY    Dispense:  90 tablet    Refill:  3    Signed,  Delynda Sepulveda T. Saul Dorsi, MD, [place] at [facility] [place] [address] Phone: [phone] Fax: [phone]    Medication List       This list is accurate as of: 08/17/13 11:59 PM.  Always use your most recent med list.               atorvastatin 40 MG tablet  Commonly known as:  LIPITOR  TAKE ONE TABLET BY MOUTH AT BEDTIME     citalopram 40 MG tablet  Commonly known as:  CELEXA  TAKE ONE TABLET BY MOUTH ONE TIME DAILY     hydrochlorothiazide 25 MG tablet  Commonly known as:  HYDRODIURIL  TAKE ONE TABLET BY MOUTH IN THE MORNING     pantoprazole 40 MG tablet  Commonly known as:  PROTONIX  TAKE 1 TABLET BY MOUTH EVERY DAY     tamsulosin 0.4 MG Caps capsule  Commonly known as:  FLOMAX  Take 1 tablet by mouth Daily.     varenicline 0.5 MG X 11 & 1 MG X 42 tablet  Commonly known as:  CHANTIX STARTING MONTH PAK  Take one 0.5mg  tablet by mouth once daily for 3 days, then increase to one 0.5mg  tablet twice daily for 3 days, then increase to one 1mg  tablet twice daily.     varenicline 1 MG tablet  Commonly known as:  CHANTIX  Take 1 tablet (1 mg total) by mouth 2 (two) times daily. HOLD FOR REFILLS AFTER START PACK

## 2013-08-17 NOTE — Progress Notes (Signed)
Pre-visit discussion using our clinic review tool. No additional management support is needed unless otherwise documented below in the visit note.  

## 2013-08-17 NOTE — Patient Instructions (Signed)
REFERRAL: GO THE THE FRONT ROOM AT THE ENTRANCE OF OUR CLINIC, NEAR CHECK IN. ASK FOR MARION. SHE WILL HELP YOU SET UP YOUR REFERRAL. DATE: TIME:  

## 2013-08-18 ENCOUNTER — Encounter: Payer: Self-pay | Admitting: Family Medicine

## 2013-09-17 DIAGNOSIS — C4491 Basal cell carcinoma of skin, unspecified: Secondary | ICD-10-CM

## 2013-09-17 HISTORY — DX: Basal cell carcinoma of skin, unspecified: C44.91

## 2013-11-13 ENCOUNTER — Other Ambulatory Visit: Payer: Self-pay | Admitting: Family Medicine

## 2013-11-16 ENCOUNTER — Other Ambulatory Visit: Payer: Self-pay | Admitting: Family Medicine

## 2013-11-18 ENCOUNTER — Other Ambulatory Visit: Payer: Self-pay

## 2013-11-18 MED ORDER — ATORVASTATIN CALCIUM 40 MG PO TABS: ORAL_TABLET | ORAL | Status: DC

## 2013-11-18 NOTE — Telephone Encounter (Signed)
Logan Norris at [facility] request refill atorvastatin 40 mg # 30 x 6 over phone.

## 2013-12-09 ENCOUNTER — Other Ambulatory Visit: Payer: Self-pay | Admitting: *Deleted

## 2013-12-09 MED ORDER — PANTOPRAZOLE SODIUM 40 MG PO TBEC: DELAYED_RELEASE_TABLET | ORAL | Status: DC

## 2014-03-02 ENCOUNTER — Encounter: Payer: Self-pay | Admitting: Family Medicine

## 2014-03-02 ENCOUNTER — Ambulatory Visit (INDEPENDENT_AMBULATORY_CARE_PROVIDER_SITE_OTHER): Payer: BC Managed Care – PPO | Admitting: Family Medicine

## 2014-03-02 VITALS — BP 120/80 | HR 87 | Temp 98.5°F | Ht 68.5 in | Wt 207.8 lb

## 2014-03-02 DIAGNOSIS — J0100 Acute maxillary sinusitis, unspecified: Secondary | ICD-10-CM

## 2014-03-02 MED ORDER — AMOXICILLIN-POT CLAVULANATE 875-125 MG PO TABS: 1.0000 | ORAL_TABLET | Freq: Two times a day (BID) | ORAL | Status: DC

## 2014-03-02 NOTE — Progress Notes (Signed)
[place] [address] Phone: [phone] Fax: [phone]  Patient ID: Logan Norris MRN: [id number], DOB: Aug 14, 1960, 53 y.o. Date of Encounter: 03/02/2014  Primary Physician:  Owens Loffler, MD   Chief Complaint: Cough and Chest Congestion   Subjective:   History of Present Illness:  This 53 y.o. male patient presents with runny nose, sneezing, cough, sore throat, malaise and minimal / low-grade fever for > 1 week. Now the primary complaint has become sinus pressure and pain behind the eyes and in the upper, anterior face about 10 days.  Fighting congestion and feeling sick for about 10 days.  Went to [facility].   The patent denies sore throat as the primary complaint. Denies sthortness of breath/wheezing, high fever, chest pain, significant myalgia, otalgia, abdominal pain, changes in bowel or bladder.  PMH, PHS, Allergies, Problem List, Medications, Family History, and Social History have all been reviewed.  Patient Active Problem List   Diagnosis Date Noted  . Obesity (BMI 30-39.9) 08/17/2013  . Bleeding ulcer, hx of 09/30/2012  . Tobacco abuse   . Anxiety 09/20/2011  . Prostate cancer 10/23/2010  . HYPERTENSION 02/09/2010  . DEPRESSION 12/21/2009  . HYPERLIPIDEMIA 01/19/2009  . GERD 01/19/2009  . Hx of diverticulitis 01/19/2009    Past Medical History  Diagnosis Date  . HLD (hyperlipidemia)   . GERD (gastroesophageal reflux disease)   . Basal cell carcinoma   . Diverticulitis of colon   . Depression   . HTN (hypertension)   . H/O: gastrointestinal hemorrhage   . Anxiety 09/20/2011  . Tobacco abuse   . Prostate cancer 10/23/2010    Qualifier: Diagnosis of  By: Bexley Laubach MD, Frederico Hamman    . Sinus tachycardia 04/13    Holter: NSR, rare PAcs and PVCs  . Hx of diverticulitis 01/19/2009    Centricity Description: DIVERTICULITIS, HX OF Qualifier: Diagnosis of  By: Lorelei Pont MD, Donathan Buller   Centricity Description: GASTROINTESTINAL HEMORRHAGE,  HX OF Qualifier: Diagnosis of  By: Lorelei Pont MD, Frederico Hamman      Past Surgical History  Procedure Laterality Date  . Insertion prostate radiation seed      History   Social History  . Marital Status: Married    Spouse Name: N/A    Number of Children: N/A  . Years of Education: N/A   Occupational History  . [employer]    Social History Main Topics  . Smoking status: Current Every Day Smoker -- 1.00 packs/day for 35 years    Types: Cigarettes  . Smokeless tobacco: Never Used  . Alcohol Use: Yes     Comment: rare  . Drug Use: No  . Sexual Activity: Not on file   Other Topics Concern  . Not on file   Social History Narrative  . No narrative on file    Family History  Problem Relation Age of Onset  . Coronary artery disease Father     CABG x2  . Heart disease Father 84    CABG  . Stroke      Grandparent  . Breast cancer      Family history -1st degree relative <50  . Heart disease Paternal Grandfather     No Known Allergies  Medication list reviewed and updated in full in [place].  Review of Systems: as above, eating and drinking - tolerating PO. Urinating normally. No excessive vomitting or diarrhea. O/w as above.  Objective:   Physical Exam:  Filed Vitals:  03/02/14 1559  BP: 120/80  Pulse: 87  Temp: 98.5 F (36.9 C)  TempSrc: Oral  Height: 5' 8.5" (1.74 m)  Weight: 207 lb 12 oz (94.235 kg)  SpO2: 96%    GEN: WDWN, Non-toxic, Atraumatic, normocephalic. A and O x 3. HEENT: Oropharynx clear without exudate, MMM, no significant LAD, mild rhinnorhea Sinuses: Right Frontal, ethmoid, and maxillary: Tender max Left Frontal, Ethmoid, and maxillary: Tender max Ears: TM clear, COL visualized with good landmarks CV: RRR, no m/g/r. Pulm: CTA B, no wheezes, rhonchi, or crackles, normal respiratory effort. EXT: no c/c/e Psych: well oriented, neither depressed nor anxious in appearance  Assessment & Plan:   Acute  maxillary sinusitis, recurrence not specified  Acute sinusitis: ABX as below.   Reviewed symptomatic care as well as ABX in this case.    Follow-up: No Follow-up on file. Unless noted above, the patient is to follow-up if symptoms worsen. Red flags were reviewed with the patient.  New Prescriptions   AMOXICILLIN-CLAVULANATE (AUGMENTIN) 875-125 MG PER TABLET    Take 1 tablet by mouth 2 (two) times daily.   Signed,  Maud Deed. Johnson Arizola, MD, [facility]   Discontinued Medications   VARENICLINE (CHANTIX STARTING MONTH PAK) 0.5 MG X 11 & 1 MG X 42 TABLET    Take one 0.5mg  tablet by mouth once daily for 3 days, then increase to one 0.5mg  tablet twice daily for 3 days, then increase to one 1mg  tablet twice daily.   VARENICLINE (CHANTIX) 1 MG TABLET    Take 1 tablet (1 mg total) by mouth 2 (two) times daily. HOLD FOR REFILLS AFTER START PACK   Current Medications at Discharge:   Medication List       This list is accurate as of: 03/02/14  5:05 PM.  Always use your most recent med list.               amoxicillin-clavulanate 875-125 MG per tablet  Commonly known as:  AUGMENTIN  Take 1 tablet by mouth 2 (two) times daily.     atorvastatin 40 MG tablet  Commonly known as:  LIPITOR  TAKE ONE TABLET BY MOUTH AT BEDTIME     citalopram 40 MG tablet  Commonly known as:  CELEXA  TAKE ONE TABLET BY MOUTH ONE TIME DAILY     hydrochlorothiazide 25 MG tablet  Commonly known as:  HYDRODIURIL  TAKE ONE TABLET BY MOUTH IN THE MORNING     pantoprazole 40 MG tablet  Commonly known as:  PROTONIX  TAKE 1 TABLET BY MOUTH EVERY DAY     tamsulosin 0.4 MG Caps capsule  Commonly known as:  FLOMAX  Take 1 tablet by mouth Daily.

## 2014-03-02 NOTE — Progress Notes (Signed)
Pre visit review using our clinic review tool, if applicable. No additional management support is needed unless otherwise documented below in the visit note. 

## 2014-05-03 ENCOUNTER — Other Ambulatory Visit: Payer: Self-pay | Admitting: *Deleted

## 2014-05-03 MED ORDER — HYDROCHLOROTHIAZIDE 25 MG PO TABS: ORAL_TABLET | ORAL | Status: DC

## 2014-05-09 ENCOUNTER — Encounter (HOSPITAL_COMMUNITY): Payer: Self-pay | Admitting: Emergency Medicine

## 2014-05-09 ENCOUNTER — Emergency Department (HOSPITAL_COMMUNITY): Payer: BC Managed Care – PPO

## 2014-05-09 ENCOUNTER — Emergency Department (HOSPITAL_COMMUNITY)
Admission: EM | Admit: 2014-05-09 | Discharge: 2014-05-09 | Disposition: A | Discharge status: Home / Self Care | Payer: BC Managed Care – PPO | Attending: Emergency Medicine | Admitting: Emergency Medicine

## 2014-05-09 DIAGNOSIS — F329 Major depressive disorder, single episode, unspecified: Secondary | ICD-10-CM | POA: Diagnosis not present

## 2014-05-09 DIAGNOSIS — Z720 Tobacco use: Secondary | ICD-10-CM | POA: Insufficient documentation

## 2014-05-09 DIAGNOSIS — Z79899 Other long term (current) drug therapy: Secondary | ICD-10-CM | POA: Insufficient documentation

## 2014-05-09 DIAGNOSIS — I10 Essential (primary) hypertension: Secondary | ICD-10-CM | POA: Insufficient documentation

## 2014-05-09 DIAGNOSIS — K219 Gastro-esophageal reflux disease without esophagitis: Secondary | ICD-10-CM | POA: Diagnosis not present

## 2014-05-09 DIAGNOSIS — R079 Chest pain, unspecified: Secondary | ICD-10-CM | POA: Diagnosis not present

## 2014-05-09 DIAGNOSIS — Z85828 Personal history of other malignant neoplasm of skin: Secondary | ICD-10-CM | POA: Diagnosis not present

## 2014-05-09 DIAGNOSIS — E785 Hyperlipidemia, unspecified: Secondary | ICD-10-CM | POA: Diagnosis not present

## 2014-05-09 DIAGNOSIS — F419 Anxiety disorder, unspecified: Secondary | ICD-10-CM | POA: Insufficient documentation

## 2014-05-09 DIAGNOSIS — M25512 Pain in left shoulder: Secondary | ICD-10-CM

## 2014-05-09 DIAGNOSIS — Z8546 Personal history of malignant neoplasm of prostate: Secondary | ICD-10-CM | POA: Diagnosis not present

## 2014-05-09 LAB — CBC WITH DIFFERENTIAL/PLATELET
BASOS ABS: 0.1 10*3/uL (ref 0.0–0.1)
Basophils Relative: 1 % (ref 0–1)
Eosinophils Absolute: 0.2 10*3/uL (ref 0.0–0.7)
Eosinophils Relative: 2 % (ref 0–5)
HCT: 44.3 % (ref 39.0–52.0)
Hemoglobin: 14.6 g/dL (ref 13.0–17.0)
LYMPHS PCT: 19 % (ref 12–46)
Lymphs Abs: 1.6 10*3/uL (ref 0.7–4.0)
MCH: 29.7 pg (ref 26.0–34.0)
MCHC: 33 g/dL (ref 30.0–36.0)
MCV: 90.2 fL (ref 78.0–100.0)
Monocytes Absolute: 0.4 10*3/uL (ref 0.1–1.0)
Monocytes Relative: 5 % (ref 3–12)
NEUTROS ABS: 6.3 10*3/uL (ref 1.7–7.7)
NEUTROS PCT: 73 % (ref 43–77)
PLATELETS: 253 10*3/uL (ref 150–400)
RBC: 4.91 MIL/uL (ref 4.22–5.81)
RDW: 13.7 % (ref 11.5–15.5)
WBC: 8.5 10*3/uL (ref 4.0–10.5)

## 2014-05-09 LAB — BASIC METABOLIC PANEL
ANION GAP: 12 (ref 5–15)
BUN: 11 mg/dL (ref 6–23)
CHLORIDE: 99 meq/L (ref 96–112)
CO2: 27 mEq/L (ref 19–32)
CREATININE: 1.17 mg/dL (ref 0.50–1.35)
Calcium: 9.4 mg/dL (ref 8.4–10.5)
GFR calc Af Amer: 79 mL/min — ABNORMAL LOW (ref >90)
GFR calc non Af Amer: 69 mL/min — ABNORMAL LOW (ref >90)
Glucose, Bld: 102 mg/dL — ABNORMAL HIGH (ref 70–99)
Potassium: 4.3 mEq/L (ref 3.7–5.3)
SODIUM: 138 meq/L (ref 137–147)

## 2014-05-09 LAB — I-STAT TROPONIN, ED: Troponin i, poc: 0 ng/mL (ref 0.00–0.08)

## 2014-05-09 MED ORDER — TRAMADOL HCL 50 MG PO TABS: 50.0000 mg | ORAL_TABLET | Freq: Four times a day (QID) | ORAL | Status: DC | PRN

## 2014-05-09 MED ORDER — TRAMADOL HCL 50 MG PO TABS
50.0000 mg | ORAL_TABLET | Freq: Once | ORAL | Status: AC
  Administered 2014-05-09: 50 mg via ORAL
  Filled 2014-05-09: qty 1

## 2014-05-09 NOTE — ED Provider Notes (Addendum)
CSN: [id number]     Arrival date & time 05/09/14  0160 History   First MD Initiated Contact with Patient 05/09/14 [phone]     Chief Complaint  Patient presents with  . Chest Pain  . Shoulder Pain     (Consider location/radiation/quality/duration/timing/severity/associated sxs/prior Treatment) HPI Comments: Denies trauma or any heavy lifting.  Family hx of dad with MI in the late 55's and then CABG in his 12's.  Pt has HTN, HLD and tobacco abuse.  Patient is a 53 y.o. male presenting with shoulder pain. The history is provided by the patient.  Shoulder Pain This is a new problem. The current episode started 2 days ago. The problem occurs constantly. The problem has not changed since onset.Associated symptoms include chest pain. Pertinent negatives include no abdominal pain, no headaches and no shortness of breath. Associated symptoms comments: Pain in the left shoulder that radiates around to the chest when he tries to get up or lays on it wrong.  Denies numbness or tingling in the hand/arm, nausea, diaphoresis, SOB or vomiting.  No leg pain or swelling.  No new cough. The symptoms are aggravated by bending and twisting. Nothing relieves the symptoms. He has tried nothing for the symptoms. The treatment provided no relief.    Past Medical History  Diagnosis Date  . HLD (hyperlipidemia)   . GERD (gastroesophageal reflux disease)   . Basal cell carcinoma   . Diverticulitis of colon   . Depression   . HTN (hypertension)   . H/O: gastrointestinal hemorrhage   . Anxiety 09/20/2011  . Tobacco abuse   . Prostate cancer 10/23/2010    Qualifier: Diagnosis of  By: Copland MD, Frederico Hamman    . Sinus tachycardia 04/13    Holter: NSR, rare PAcs and PVCs  . Hx of diverticulitis 01/19/2009    Centricity Description: DIVERTICULITIS, HX OF Qualifier: Diagnosis of  By: Lorelei Pont MD, Spencer   Centricity Description: GASTROINTESTINAL HEMORRHAGE, HX OF Qualifier: Diagnosis of  By: Lorelei Pont MD, Frederico Hamman     Past  Surgical History  Procedure Laterality Date  . Insertion prostate radiation seed     Family History  Problem Relation Age of Onset  . Coronary artery disease Father     CABG x2  . Heart disease Father 39    CABG  . Stroke      Grandparent  . Breast cancer      Family history -1st degree relative <50  . Heart disease Paternal Grandfather    History  Substance Use Topics  . Smoking status: Current Every Day Smoker -- 1.00 packs/day for 35 years    Types: Cigarettes  . Smokeless tobacco: Never Used  . Alcohol Use: Yes     Comment: rare    Review of Systems  Respiratory: Negative for shortness of breath.   Cardiovascular: Positive for chest pain.  Gastrointestinal: Negative for abdominal pain.  Neurological: Negative for headaches.  All other systems reviewed and are negative.     Allergies  Review of patient's allergies indicates no known allergies.  Home Medications   Prior to Admission medications   Medication Sig Start Date End Date Taking? Authorizing Provider  atorvastatin (LIPITOR) 40 MG tablet Take 40 mg by mouth daily at 6 PM.   Yes Historical Provider, MD  citalopram (CELEXA) 40 MG tablet Take 40 mg by mouth daily.   Yes Historical Provider, MD  hydrochlorothiazide (HYDRODIURIL) 25 MG tablet Take 25 mg by mouth daily.   Yes Historical Provider, MD  ibuprofen (ADVIL,MOTRIN) 200 MG tablet Take 800 mg by mouth every 6 (six) hours as needed for headache or moderate pain.   Yes Historical Provider, MD  pantoprazole (PROTONIX) 40 MG tablet Take 40 mg by mouth daily.   Yes Historical Provider, MD  Tamsulosin HCl (FLOMAX) 0.4 MG CAPS Take 0.4 mg by mouth Daily.  10/20/11  Yes Historical Provider, MD   There were no vitals taken for this visit. Physical Exam  Nursing note and vitals reviewed. Constitutional: He is oriented to person, place, and time. He appears well-developed and well-nourished. No distress.  HENT:  Head: Normocephalic and atraumatic.  Mouth/Throat:  Oropharynx is clear and moist.  Eyes: Conjunctivae and EOM are normal. Pupils are equal, round, and reactive to light.  Neck: Normal range of motion. Neck supple.  Cardiovascular: Normal rate, regular rhythm and intact distal pulses.   No murmur heard. Pulmonary/Chest: Effort normal and breath sounds normal. No respiratory distress. He has no wheezes. He has no rales. He exhibits no tenderness.  Abdominal: Soft. He exhibits no distension. There is no tenderness. There is no rebound and no guarding.  Musculoskeletal: Normal range of motion. He exhibits tenderness. He exhibits no edema.       Left shoulder: He exhibits tenderness and spasm. He exhibits normal range of motion, no deformity, normal pulse and normal strength.       Arms: Neurological: He is alert and oriented to person, place, and time.  Skin: Skin is warm and dry. No rash noted. No erythema.  Psychiatric: He has a normal mood and affect. His behavior is normal.    ED Course  Procedures (including critical care time) Labs Review Labs Reviewed  BASIC METABOLIC PANEL - Abnormal; Notable for the following:    Glucose, Bld 102 (*)    GFR calc non Af Amer 69 (*)    GFR calc Af Amer 79 (*)    All other components within normal limits  CBC WITH DIFFERENTIAL  I-STAT TROPOININ, ED    Imaging Review Dg Chest 2 View  05/09/2014   CLINICAL DATA:  left shoulder pain radiating to the left chest for 3 days, smoker;  EXAM: CHEST - 2 VIEW  COMPARISON:  01/10/2011  FINDINGS: Low lung volumes. Subsegmental atelectasis versus early interstitial infiltrate at the lung bases left greater than right. Linear scarring or subsegmental atelectasis in the right middle lobe. Heart size upper limits normal. Tortuous thoracic aorta. No effusion.  No pneumothorax. Visualized skeletal structures are unremarkable.  IMPRESSION: Low lung volumes with bibasilar atelectasis versus early interstitial infiltrate, left greater than right.   Electronically Signed    By: Arne Cleveland M.D.   On: 05/09/2014 10:01     EKG Interpretation   Date/Time:  Sunday May 09 2014 08:52:24 EDT Ventricular Rate:  85 PR Interval:  160 QRS Duration: 92 QT Interval:  370 QTC Calculation: 440 R Axis:   8 Text Interpretation:  Normal sinus rhythm Anterior infarct , age  undetermined No significant change since last tracing Confirmed by  [facility]  MD, Loree Fee ([id number]) on 05/09/2014 9:13:17 AM      MDM   Final diagnoses:  Shoulder pain, acute, left    Patient presenting with an atypical story for chest pain. He states for the last 3 days he has had pain in his left shoulder that is worse when he moves his arm when he puts pressure on his arms the pain will radiate around to the front of his chest. He denies any  associated symptoms such as shortness of breath, diaphoresis, nausea, vomiting. He has no numbness or tingling in his fingers. No abdominal pain.  On exam pain is reproducible with palpation in the left trapezius. Pain is reproduced with movement of the right arm. There is no chest wall pain. Normal pulses bilaterally. No abdominal pain. Patient is otherwise well-appearing.  Low risk well as criteria with no recent travel, surgery, hospitalization, immobilization, no prior history of PE or family history.  Patient does have multiple risk factors including family history his father and grandmother who both had MIs in their late 2s. None of siblings have had cardiac issues. Also tobacco abuse, hypertension and hyperlipidemia. Patient did have a stress test done 2 years ago that was within normal limits. EKG today is unchanged from prior.  Since patient has had ongoing pain that has been unrelenting for the last 2-1/2 days 1 troponin will be adequate to rule out him out given his atypical story. Also given patient is a smoker will do a chest x-ray to rule out masses or other causes for the pain. However feel most likely this is musculoskeletal in  nature  10:23 AM Labs including troponin are negative. Chest x-ray with some atelectasis but no other concerning findings. Patient has no cough, fever or URI symptoms concerning for infection.  Will treat for musculoskeletal pain and have him follow up with his PMD if symptoms worsen  Blanchie Dessert, MD 05/09/14 [place], MD 05/09/14 1032

## 2014-05-09 NOTE — ED Notes (Signed)
Pt here for evaluation of left sided shoulder blade pain radiating to left chest since Friday. Pt found relief with tramadol on Friday. Denies other symptoms.

## 2014-05-09 NOTE — ED Notes (Signed)
Pt. Stated, I started having shoulder pain left and goes to my chest since "friday.  Its constant.

## 2014-06-04 ENCOUNTER — Other Ambulatory Visit: Payer: Self-pay | Admitting: *Deleted

## 2014-06-04 MED ORDER — HYDROCHLOROTHIAZIDE 25 MG PO TABS: 25.0000 mg | ORAL_TABLET | Freq: Every day | ORAL | Status: DC

## 2014-07-15 ENCOUNTER — Telehealth: Payer: Self-pay

## 2014-07-15 NOTE — Telephone Encounter (Signed)
Logan Norris left v/m; wants to know status of Tdap injection; pt to have new grandchild 08/2014; spoke with pt and advised immunization record has TD listed 2007. Pt will ck with ins co for coverage of tdap and will cb for nurse visit if wants to get at [facility].

## 2014-10-06 ENCOUNTER — Telehealth: Payer: Self-pay | Admitting: *Deleted

## 2014-10-06 MED ORDER — PANTOPRAZOLE SODIUM 40 MG PO TBEC: 40.0000 mg | DELAYED_RELEASE_TABLET | Freq: Every day | ORAL | Status: DC

## 2014-10-06 NOTE — Telephone Encounter (Signed)
Please scheduled CPE with fasting labs prior with Dr. Lorelei Pont sometime in the next three months.

## 2014-10-07 NOTE — Telephone Encounter (Signed)
Left message asking pt to call office  °

## 2014-10-08 NOTE — Telephone Encounter (Signed)
Labs 4/6 cpx 4/11 Pt aware

## 2014-11-08 ENCOUNTER — Other Ambulatory Visit: Payer: Self-pay | Admitting: *Deleted

## 2014-11-08 MED ORDER — ATORVASTATIN CALCIUM 40 MG PO TABS: 40.0000 mg | ORAL_TABLET | Freq: Every day | ORAL | Status: DC

## 2014-11-10 ENCOUNTER — Other Ambulatory Visit (INDEPENDENT_AMBULATORY_CARE_PROVIDER_SITE_OTHER): Payer: BLUE CROSS/BLUE SHIELD

## 2014-11-10 DIAGNOSIS — Z125 Encounter for screening for malignant neoplasm of prostate: Secondary | ICD-10-CM | POA: Diagnosis not present

## 2014-11-10 DIAGNOSIS — Z13220 Encounter for screening for lipoid disorders: Secondary | ICD-10-CM | POA: Diagnosis not present

## 2014-11-10 DIAGNOSIS — Z79899 Other long term (current) drug therapy: Secondary | ICD-10-CM

## 2014-11-10 LAB — BASIC METABOLIC PANEL
BUN: 17 mg/dL (ref 6–23)
CALCIUM: 9.8 mg/dL (ref 8.4–10.5)
CO2: 30 mEq/L (ref 19–32)
CREATININE: 1.11 mg/dL (ref 0.40–1.50)
Chloride: 99 mEq/L (ref 96–112)
GFR: 72.93 mL/min (ref >60.00)
GLUCOSE: 108 mg/dL — AB (ref 70–99)
Potassium: 4.5 mEq/L (ref 3.5–5.1)
Sodium: 137 mEq/L (ref 135–145)

## 2014-11-10 LAB — CBC WITH DIFFERENTIAL/PLATELET
BASOS ABS: 0 10*3/uL (ref 0.0–0.1)
Basophils Relative: 0.7 % (ref 0.0–3.0)
Eosinophils Absolute: 0.2 10*3/uL (ref 0.0–0.7)
Eosinophils Relative: 3.4 % (ref 0.0–5.0)
HCT: 41.3 % (ref 39.0–52.0)
Hemoglobin: 14.1 g/dL (ref 13.0–17.0)
LYMPHS PCT: 24.7 % (ref 12.0–46.0)
Lymphs Abs: 1.6 10*3/uL (ref 0.7–4.0)
MCHC: 34.3 g/dL (ref 30.0–36.0)
MCV: 88.7 fl (ref 78.0–100.0)
Monocytes Absolute: 0.6 10*3/uL (ref 0.1–1.0)
Monocytes Relative: 8.5 % (ref 3.0–12.0)
NEUTROS PCT: 62.7 % (ref 43.0–77.0)
Neutro Abs: 4.1 10*3/uL (ref 1.4–7.7)
PLATELETS: 292 10*3/uL (ref 150.0–400.0)
RBC: 4.66 Mil/uL (ref 4.22–5.81)
RDW: 14.4 % (ref 11.5–15.5)
WBC: 6.5 10*3/uL (ref 4.0–10.5)

## 2014-11-10 LAB — LDL CHOLESTEROL, DIRECT: LDL DIRECT: 111 mg/dL

## 2014-11-10 LAB — HEPATIC FUNCTION PANEL
ALK PHOS: 59 U/L (ref 39–117)
ALT: 14 U/L (ref 0–53)
AST: 16 U/L (ref 0–37)
Albumin: 4.4 g/dL (ref 3.5–5.2)
BILIRUBIN DIRECT: 0.1 mg/dL (ref 0.0–0.3)
Total Bilirubin: 0.4 mg/dL (ref 0.2–1.2)
Total Protein: 7.1 g/dL (ref 6.0–8.3)

## 2014-11-10 LAB — LIPID PANEL
CHOL/HDL RATIO: 7
Cholesterol: 233 mg/dL — ABNORMAL HIGH (ref 0–200)
HDL: 35.2 mg/dL — ABNORMAL LOW (ref >39.00)
Triglycerides: 612 mg/dL — ABNORMAL HIGH (ref 0.0–149.0)

## 2014-11-10 LAB — PSA: PSA: 0.08 ng/mL — AB (ref 0.10–4.00)

## 2014-11-15 ENCOUNTER — Encounter: Payer: Self-pay | Admitting: Family Medicine

## 2014-11-15 ENCOUNTER — Ambulatory Visit (INDEPENDENT_AMBULATORY_CARE_PROVIDER_SITE_OTHER): Payer: BLUE CROSS/BLUE SHIELD | Admitting: Family Medicine

## 2014-11-15 VITALS — BP 128/78 | HR 96 | Temp 98.2°F | Ht 68.66 in | Wt 213.0 lb

## 2014-11-15 DIAGNOSIS — Z1211 Encounter for screening for malignant neoplasm of colon: Secondary | ICD-10-CM

## 2014-11-15 DIAGNOSIS — Z0000 Encounter for general adult medical examination without abnormal findings: Secondary | ICD-10-CM

## 2014-11-15 NOTE — Progress Notes (Signed)
Pre visit review using our clinic review tool, if applicable. No additional management support is needed unless otherwise documented below in the visit note. 

## 2014-11-15 NOTE — Patient Instructions (Signed)

## 2014-11-15 NOTE — Progress Notes (Signed)
Dr. Frederico Hamman T. Jahari Wiginton, MD, [facility] [place], [address] Phone: [phone] Fax: [phone]  11/15/2014  Patient: Logan Norris, MRN: [id number], DOB: 10-Aug-1960, 54 y.o.  Primary Physician:  Owens Loffler, MD  Chief Complaint: Annual Exam  Subjective:   Logan Norris is a 54 y.o. pleasant patient who presents with the following:  Preventative Health Maintenance Visit:  Health Maintenance Summary Reviewed and updated, unless pt declines services.  Tobacco History Reviewed. Alcohol: No concerns, no excessive use Exercise Habits: no activity, rec at least 30 mins 5 times a week STD concerns: no risk or activity to increase risk Drug Use: None Encouraged self-testicular check  Health Maintenance  Topic Date Due  . HIV Screening  03/27/1974  . COLONOSCOPY  03/27/2009  . INFLUENZA VACCINE  03/07/2015  . TETANUS/TDAP  08/07/2015   Immunization History  Administered Date(s) Administered  . Influenza Whole 06/06/2010  . Td 08/06/2005   Patient Active Problem List   Diagnosis Date Noted  . Obesity (BMI 30-39.9) 08/17/2013  . Bleeding ulcer, hx of 09/30/2012  . Tobacco abuse   . Anxiety 09/20/2011  . Prostate cancer 10/23/2010  . HYPERTENSION 02/09/2010  . DEPRESSION 12/21/2009  . HYPERLIPIDEMIA 01/19/2009  . GERD 01/19/2009  . Hx of diverticulitis 01/19/2009   Past Medical History  Diagnosis Date  . HLD (hyperlipidemia)   . GERD (gastroesophageal reflux disease)   . Basal cell carcinoma   . Diverticulitis of colon   . Depression   . HTN (hypertension)   . H/O: gastrointestinal hemorrhage   . Anxiety 09/20/2011  . Tobacco abuse   . Prostate cancer 10/23/2010    Qualifier: Diagnosis of  By: Selita Staiger MD, Frederico Hamman    . Sinus tachycardia 04/13    Holter: NSR, rare PAcs and PVCs  . Hx of diverticulitis 01/19/2009    Centricity Description: DIVERTICULITIS, HX OF Qualifier: Diagnosis of  By:  Lorelei Pont MD, Seniyah Esker   Centricity Description: GASTROINTESTINAL HEMORRHAGE, HX OF Qualifier: Diagnosis of  By: Lorelei Pont MD, Frederico Hamman     Past Surgical History  Procedure Laterality Date  . Insertion prostate radiation seed     History   Social History  . Marital Status: Married    Spouse Name: N/A  . Number of Children: N/A  . Years of Education: N/A   Occupational History  . [employer]    Social History Main Topics  . Smoking status: Current Every Day Smoker -- 1.00 packs/day for 35 years    Types: Cigarettes  . Smokeless tobacco: Never Used  . Alcohol Use: Yes     Comment: rare  . Drug Use: No  . Sexual Activity: Not on file   Other Topics Concern  . Not on file   Social History Narrative   Family History  Problem Relation Age of Onset  . Coronary artery disease Father     CABG x2  . Heart disease Father 7    CABG  . Stroke      Grandparent  . Breast cancer      Family history -1st degree relative <50  . Heart disease Paternal Grandfather    No Known Allergies  Medication list has been reviewed and updated.   General: Denies fever, chills, sweats. No significant weight loss. Eyes: Denies blurring,significant itching ENT: Denies earache, sore throat, and hoarseness. Cardiovascular: Denies chest pains, palpitations, dyspnea on exertion Respiratory: Denies cough, dyspnea at rest,wheeezing Breast: no concerns  about lumps GI: Denies nausea, vomiting, diarrhea, constipation, change in bowel habits, abdominal pain, melena, hematochezia GU: Denies penile discharge, ED, urinary flow / outflow problems. No STD concerns. Musculoskeletal: Denies back pain, joint pain Derm: Denies rash, itching Neuro: Denies  paresthesias, frequent falls, frequent headaches Psych: Denies depression, anxiety Endocrine: Denies cold intolerance, heat intolerance, polydipsia Heme: Denies enlarged lymph nodes Allergy: No hayfever  Objective:   BP 128/78  mmHg  Pulse 96  Temp(Src) 98.2 F (36.8 C) (Oral)  Ht 5' 8.66" (1.744 m)  Wt 213 lb (96.616 kg)  BMI 31.77 kg/m2 Ideal Body Weight: Weight in (lb) to have BMI = 25: 167.3  No exam data present  GEN: well developed, well nourished, no acute distress Eyes: conjunctiva and lids normal, PERRLA, EOMI ENT: TM clear, nares clear, oral exam WNL Neck: supple, no lymphadenopathy, no thyromegaly, no JVD Pulm: clear to auscultation and percussion, respiratory effort normal CV: regular rate and rhythm, S1-S2, no murmur, rub or gallop, no bruits, peripheral pulses normal and symmetric, no cyanosis, clubbing, edema or varicosities GI: soft, non-tender; no hepatosplenomegaly, masses; active bowel sounds all quadrants GU: no hernia, testicular mass, penile discharge Lymph: no cervical, axillary or inguinal adenopathy MSK: gait normal, muscle tone and strength WNL, no joint swelling, effusions, discoloration, crepitus  SKIN: clear, good turgor, color WNL, no rashes, lesions, or ulcerations Neuro: normal mental status, normal strength, sensation, and motion Psych: alert; oriented to person, place and time, normally interactive and not anxious or depressed in appearance. All labs reviewed with patient.  Lipids:    Component Value Date/Time   CHOL 233* 11/10/2014 1036   TRIG * 11/10/2014 1036    612.0 Triglyceride is over 400; calculations on Lipids are invalid.   HDL 35.20* 11/10/2014 1036   LDLDIRECT 111.0 11/10/2014 1036   VLDL 41.6* 08/14/2013 0853   CHOLHDL 7 11/10/2014 1036   CBC: CBC Latest Ref Rng 11/10/2014 05/09/2014 08/14/2013  WBC 4.0 - 10.5 K/uL 6.5 8.5 8.3  Hemoglobin 13.0 - 17.0 g/dL 14.1 14.6 15.3  Hematocrit 39.0 - 52.0 % 41.3 44.3 44.7  Platelets 150.0 - 400.0 K/uL 292.0 253 350.0    Basic Metabolic Panel:    Component Value Date/Time   NA 137 11/10/2014 1036   K 4.5 11/10/2014 1036   CL 99 11/10/2014 1036   CO2 30 11/10/2014 1036   BUN 17 11/10/2014 1036   CREATININE  1.11 11/10/2014 1036   GLUCOSE 108* 11/10/2014 1036   CALCIUM 9.8 11/10/2014 1036   Hepatic Function Latest Ref Rng 11/10/2014 08/14/2013 04/08/2012  Total Protein 6.0 - 8.3 g/dL 7.1 7.3 7.1  Albumin 3.5 - 5.2 g/dL 4.4 4.5 4.2  AST 0 - 37 U/L _0 ALT 0 - 53 U/L _1 Alk Phosphatase 39 - 117 U/L 59 49 54  Total Bilirubin 0.2 - 1.2 mg/dL 0.4 0.8 0.4  Bilirubin, Direct 0.0 - 0.3 mg/dL 0.1 0.0 0.0    Lab Results  Component Value Date   TSH 2.06 07/26/2010   Lab Results  Component Value Date   PSA 0.08* 11/10/2014   PSA 4.43* 10/17/2010   PSA 3.53 07/26/2010    Assessment and Plan:   Routine general medical examination at a health care facility  Special screening for malignant neoplasms, colon - Plan: Ambulatory referral to Gastroenterology  Health Maintenance Exam: The patient's preventative maintenance and recommended screening tests for an annual wellness exam were reviewed in full today. Brought up to date unless services  declined.  Counselled on the importance of diet, exercise, and its role in overall health and mortality. The patient's FH and SH was reviewed, including their home life, tobacco status, and drug and alcohol status.  Eating very poorly, no exercise and trigs are up a lot. Work on these, recheck   Follow-up: 6 mo - recheck trigs Unless noted, follow-up in 1 year for Health Maintenance Exam.  New Prescriptions   No medications on file   Orders Placed This Encounter  Procedures  . Ambulatory referral to Gastroenterology    Signed,  Frederico Hamman T. Verne Lanuza, MD   Patient's Medications  New Prescriptions   No medications on file  Previous Medications   ATORVASTATIN (LIPITOR) 40 MG TABLET    Take 1 tablet (40 mg total) by mouth daily at 6 PM.   CITALOPRAM (CELEXA) 40 MG TABLET    Take 40 mg by mouth daily.   HYDROCHLOROTHIAZIDE (HYDRODIURIL) 25 MG TABLET    Take 1 tablet (25 mg total) by mouth daily.   IBUPROFEN (ADVIL,MOTRIN) 200 MG TABLET     Take 800 mg by mouth every 6 (six) hours as needed for headache or moderate pain.   PANTOPRAZOLE (PROTONIX) 40 MG TABLET    Take 1 tablet (40 mg total) by mouth daily.   TAMSULOSIN HCL (FLOMAX) 0.4 MG CAPS    Take 0.4 mg by mouth Daily.    TRAMADOL (ULTRAM) 50 MG TABLET    Take 1 tablet (50 mg total) by mouth every 6 (six) hours as needed.  Modified Medications   No medications on file  Discontinued Medications   No medications on file

## 2015-01-06 ENCOUNTER — Other Ambulatory Visit: Payer: Self-pay | Admitting: *Deleted

## 2015-01-06 MED ORDER — CITALOPRAM HYDROBROMIDE 40 MG PO TABS: 40.0000 mg | ORAL_TABLET | Freq: Every day | ORAL | Status: DC

## 2015-01-12 ENCOUNTER — Ambulatory Visit (INDEPENDENT_AMBULATORY_CARE_PROVIDER_SITE_OTHER): Payer: BLUE CROSS/BLUE SHIELD | Admitting: Family Medicine

## 2015-01-12 ENCOUNTER — Encounter: Payer: Self-pay | Admitting: Family Medicine

## 2015-01-12 VITALS — BP 116/70 | HR 85 | Temp 98.4°F | Ht 68.66 in | Wt 199.8 lb

## 2015-01-12 DIAGNOSIS — G5782 Other specified mononeuropathies of left lower limb: Secondary | ICD-10-CM

## 2015-01-12 DIAGNOSIS — G5792 Unspecified mononeuropathy of left lower limb: Secondary | ICD-10-CM | POA: Diagnosis not present

## 2015-01-12 MED ORDER — PREDNISONE 20 MG PO TABS: ORAL_TABLET | ORAL | Status: DC

## 2015-01-12 NOTE — Progress Notes (Signed)
Dr. Frederico Hamman T. Zakyra Kukuk, MD, [facility] and Sports Medicine [place], [address] Phone: [phone] Fax: [phone]  01/12/2015  Patient: Logan Norris, MRN: [id number], DOB: October 27, 1960, 54 y.o.  Primary Physician:  Owens Loffler, MD  Chief Complaint: Numbness and Insect Bite  Subjective:   Logan Norris is a 54 y.o. very pleasant male patient who presents with the following:  Insect bite, abscess, healing  L lateral foot numb.  No trauma.  No radiculopathy No other recent changes at all.  No pain at all.   Past Medical History, Surgical History, Social History, Family History, Problem List, Medications, and Allergies have been reviewed and updated if relevant.  GEN: no acute illness or fever CV: No chest pain or shortness of breath MSK: detailed above Neuro: neurological signs are described above ROS O/w per HPI  Objective:   BP 116/70 mmHg  Pulse 85  Temp(Src) 98.4 F (36.9 C) (Oral)  Ht 5' 8.66" (1.744 m)  Wt 199 lb 12 oz (90.606 kg)  BMI 29.79 kg/m2   GEN: Well-developed,well-nourished,in no acute distress; alert,appropriate and cooperative throughout examination HEENT: Normocephalic and atraumatic without obvious abnormalities. Ears, externally no deformities PULM: Breathing comfortably in no respiratory distress EXT: No clubbing, cyanosis, or edema PSYCH: Normally interactive. Cooperative during the interview. Pleasant. Friendly and conversant. Not anxious or depressed appearing. Normal, full affect.   ANKLE: L Echymosis: no Edema: no ROM: Full dorsi and plantar flexion, inversion, eversion Gait: heel toe, non-antalgic Lateral Mall: NT Medial Mall: NT Talus: NT Navicular: NT Cuboid: NT Calcaneous: NT Metatarsals: NT 5th MT: NT Phalanges: NT Achilles: NT Plantar Fascia: NT Fat Pad: NT Peroneals: NT Post Tib: NT Great Toe: Nml motion Ant Drawer: neg Talar Tilt: neg ATFL: NT CFL:  NT Deltoid: NT Str: 5/5 Other Special tests: none Sensation: decreased laterally along the 5th MT, toe, lateral ankle only  Radiology: No results found.  Assessment and Plan:   Neuropathy of left sural nerve  >25 minutes spent in face to face time with patient, >50% spent in counselling or coordination of care: Additional time spent in reviewing articles, UpToDate, other review articles on this condition.  Not a very common nerve entrapment.  The patient was reassured that there is likely nothing significant in that is going to happen.  He is not a knee pain.  I'm going to try to do a pulse of steroids to see if this helps.  If this does not help, I may have him see one of my colleagues to see if a nerve hydrodissection would help.  Follow-up: No Follow-up on file.  New Prescriptions   PREDNISONE (DELTASONE) 20 MG TABLET    2 tabs po for 5 days, then 1 tab po for 5 days   Patient Instructions  Capzaicin Cream: use cream small amount to outside foot 2--3 times a day. For the next few weeks     Signed,  Camora Tremain T. Macklyn Glandon, MD   Patient's Medications  New Prescriptions   PREDNISONE (DELTASONE) 20 MG TABLET    2 tabs po for 5 days, then 1 tab po for 5 days  Previous Medications   ATORVASTATIN (LIPITOR) 40 MG TABLET    Take 1 tablet (40 mg total) by mouth daily at 6 PM.   CITALOPRAM (CELEXA) 40 MG TABLET    Take 1 tablet (40 mg total) by mouth daily.   HYDROCHLOROTHIAZIDE (HYDRODIURIL) 25 MG TABLET    Take 1 tablet (25  mg total) by mouth daily.   IBUPROFEN (ADVIL,MOTRIN) 200 MG TABLET    Take 800 mg by mouth every 6 (six) hours as needed for headache or moderate pain.   PANTOPRAZOLE (PROTONIX) 40 MG TABLET    Take 1 tablet (40 mg total) by mouth daily.   TAMSULOSIN HCL (FLOMAX) 0.4 MG CAPS    Take 0.4 mg by mouth Daily.    TRAMADOL (ULTRAM) 50 MG TABLET    Take 1 tablet (50 mg total) by mouth every 6 (six) hours as needed.  Modified Medications   No medications on file   Discontinued Medications   No medications on file

## 2015-01-12 NOTE — Patient Instructions (Signed)
Capzaicin Cream: use cream small amount to outside foot 2--3 times a day. For the next few weeks

## 2015-01-12 NOTE — Progress Notes (Signed)
Pre visit review using our clinic review tool, if applicable. No additional management support is needed unless otherwise documented below in the visit note. 

## 2015-01-17 ENCOUNTER — Other Ambulatory Visit: Payer: Self-pay | Admitting: *Deleted

## 2015-01-17 MED ORDER — PANTOPRAZOLE SODIUM 40 MG PO TBEC: 40.0000 mg | DELAYED_RELEASE_TABLET | Freq: Every day | ORAL | Status: DC

## 2015-01-19 ENCOUNTER — Other Ambulatory Visit: Payer: Self-pay | Admitting: Gastroenterology

## 2015-01-19 LAB — HM COLONOSCOPY

## 2015-01-31 ENCOUNTER — Encounter: Payer: Self-pay | Admitting: Family Medicine

## 2015-01-31 ENCOUNTER — Other Ambulatory Visit: Payer: Self-pay | Admitting: *Deleted

## 2015-01-31 MED ORDER — ATORVASTATIN CALCIUM 40 MG PO TABS: 40.0000 mg | ORAL_TABLET | Freq: Every day | ORAL | Status: DC

## 2015-02-23 ENCOUNTER — Telehealth: Payer: Self-pay

## 2015-02-23 NOTE — Telephone Encounter (Signed)
Logan Norris left v/m requesting new rx for Chantix to [facility]; pt wants to stop smoking. [name] wants to know if has any savings coupons for Chantix. [name] request cb.

## 2015-02-27 MED ORDER — VARENICLINE TARTRATE 0.5 MG X 11 & 1 MG X 42 PO MISC: ORAL | Status: DC

## 2015-02-27 MED ORDER — VARENICLINE TARTRATE 1 MG PO TABS: 1.0000 mg | ORAL_TABLET | Freq: Two times a day (BID) | ORAL | Status: DC

## 2015-03-03 NOTE — Telephone Encounter (Signed)
Vicki pts wife(DPR signed) left v/m wanting to know why did not get cb about Chantix rx. Unable to reach [name] and she said on v/m could call pt at [phone] (can leave detailed message per [facility]). Left v/m apologizing for pt not getting cb and I spoke with [place] and chantix rx is ready for pick up. Pharmacy did automated call to pt on 03/02/15 that rx ready.

## 2015-03-21 ENCOUNTER — Other Ambulatory Visit: Payer: Self-pay | Admitting: *Deleted

## 2015-03-21 MED ORDER — HYDROCHLOROTHIAZIDE 25 MG PO TABS: 25.0000 mg | ORAL_TABLET | Freq: Every day | ORAL | Status: DC

## 2015-03-21 NOTE — Telephone Encounter (Signed)
Received faxed refill request from pharmacy for HCTZ. Refill sent to pharmacy electronically.

## 2015-05-16 ENCOUNTER — Telehealth: Payer: Self-pay | Admitting: Family Medicine

## 2015-05-16 DIAGNOSIS — E785 Hyperlipidemia, unspecified: Secondary | ICD-10-CM

## 2015-05-16 NOTE — Telephone Encounter (Signed)
Pt called he has appointment wed 05/25/15 he wanted to know if he needs labs prior to his appointment he stated this was to check his triglycerides

## 2015-05-16 NOTE — Telephone Encounter (Signed)
FLP, E78.5   At lab draw prior to appt

## 2015-05-17 NOTE — Telephone Encounter (Signed)
LMOM for pt to call and schedule a lab appt

## 2015-05-18 ENCOUNTER — Ambulatory Visit: Payer: BLUE CROSS/BLUE SHIELD | Admitting: Family Medicine

## 2015-05-24 ENCOUNTER — Other Ambulatory Visit (INDEPENDENT_AMBULATORY_CARE_PROVIDER_SITE_OTHER): Payer: BLUE CROSS/BLUE SHIELD

## 2015-05-24 DIAGNOSIS — E785 Hyperlipidemia, unspecified: Secondary | ICD-10-CM

## 2015-05-24 LAB — LIPID PANEL
Cholesterol: 152 mg/dL (ref 0–200)
HDL: 37.8 mg/dL — ABNORMAL LOW (ref >39.00)
LDL CALC: 81 mg/dL (ref 0–99)
NONHDL: 113.82
Total CHOL/HDL Ratio: 4
Triglycerides: 165 mg/dL — ABNORMAL HIGH (ref 0.0–149.0)
VLDL: 33 mg/dL (ref 0.0–40.0)

## 2015-05-25 ENCOUNTER — Encounter: Payer: Self-pay | Admitting: Family Medicine

## 2015-05-25 ENCOUNTER — Ambulatory Visit (INDEPENDENT_AMBULATORY_CARE_PROVIDER_SITE_OTHER): Payer: BLUE CROSS/BLUE SHIELD | Admitting: Family Medicine

## 2015-05-25 VITALS — BP 112/70 | HR 74 | Temp 98.3°F | Ht 68.66 in | Wt 199.0 lb

## 2015-05-25 DIAGNOSIS — E781 Pure hyperglyceridemia: Secondary | ICD-10-CM

## 2015-05-25 DIAGNOSIS — E7800 Pure hypercholesterolemia, unspecified: Secondary | ICD-10-CM

## 2015-05-25 NOTE — Progress Notes (Signed)
Pre visit review using our clinic review tool, if applicable. No additional management support is needed unless otherwise documented below in the visit note. 

## 2015-05-25 NOTE — Progress Notes (Signed)
Dr. Frederico Hamman T. Erendida Wrenn, MD, [facility] [place], [address] Phone: [phone] Fax: [phone]  05/25/2015  Patient: Logan Norris, MRN: [id number], DOB: 1961-01-05, 54 y.o.  Primary Physician:  Owens Loffler, MD   Chief Complaint  Patient presents with  . Follow-up    Cholesterol   Subjective:   Logan Norris is a 54 y.o. very pleasant male patient who presents with the following:  F/u chol:  Wt Readings from Last 3 Encounters:  05/25/15 199 lb (90.266 kg)  01/12/15 199 lb 12 oz (90.606 kg)  11/15/14 213 lb (96.616 kg)    Lipids: Doing well, stable. Tolerating meds fine with no SE. Panel reviewed with patient. Doing much better, eating much better.   Lipids:    Component Value Date/Time   CHOL 152 05/24/2015 0824   TRIG 165.0* 05/24/2015 0824   HDL 37.80* 05/24/2015 0824   LDLDIRECT 111.0 11/10/2014 1036   VLDL 33.0 05/24/2015 0824   CHOLHDL 4 05/24/2015 0824    Lab Results  Component Value Date   ALT 14 11/10/2014   AST 16 11/10/2014   ALKPHOS 59 11/10/2014   BILITOT 0.4 11/10/2014     Past Medical History, Surgical History, Social History, Family History, Problem List, Medications, and Allergies have been reviewed and updated if relevant.  Patient Active Problem List   Diagnosis Date Noted  . Obesity (BMI 30-39.9) 08/17/2013  . Bleeding ulcer, hx of 09/30/2012  . Tobacco abuse   . Anxiety 09/20/2011  . Prostate cancer ([place]) 10/23/2010  . HYPERTENSION 02/09/2010  . DEPRESSION 12/21/2009  . HYPERLIPIDEMIA 01/19/2009  . GERD 01/19/2009  . Hx of diverticulitis 01/19/2009    Past Medical History  Diagnosis Date  . HLD (hyperlipidemia)   . GERD (gastroesophageal reflux disease)   . Basal cell carcinoma   . Diverticulitis of colon   . Depression   . HTN (hypertension)   . H/O: gastrointestinal hemorrhage   . Anxiety 09/20/2011  . Tobacco abuse   . Prostate cancer ([place])  10/23/2010    Qualifier: Diagnosis of  By: Lorelei Pont MD, Frederico Hamman    . Sinus tachycardia ([place]) 04/13    Holter: NSR, rare PAcs and PVCs  . Hx of diverticulitis 01/19/2009    Centricity Description: DIVERTICULITIS, HX OF Qualifier: Diagnosis of  By: Lorelei Pont MD, Mclean Moya   Centricity Description: GASTROINTESTINAL HEMORRHAGE, HX OF Qualifier: Diagnosis of  By: Lorelei Pont MD, Frederico Hamman      Past Surgical History  Procedure Laterality Date  . Insertion prostate radiation seed      Social History   Social History  . Marital Status: Married    Spouse Name: N/A  . Number of Children: N/A  . Years of Education: N/A   Occupational History  . [employer]    Social History Main Topics  . Smoking status: Current Every Day Smoker -- 1.00 packs/day for 35 years    Types: Cigarettes  . Smokeless tobacco: Never Used  . Alcohol Use: Yes     Comment: rare  . Drug Use: No  . Sexual Activity: Not on file   Other Topics Concern  . Not on file   Social History Narrative    Family History  Problem Relation Age of Onset  . Coronary artery disease Father     CABG x2  . Heart disease Father 68    CABG  . Stroke      Grandparent  .  Breast cancer      Family history -1st degree relative <50  . Heart disease Paternal Grandfather     No Known Allergies  Medication list reviewed and updated in full in [place].   GEN: No acute illnesses, no fevers, chills. GI: No n/v/d, eating normally Pulm: No SOB Interactive and getting along well at home.  Otherwise, ROS is as per the HPI.  Objective:   BP 112/70 mmHg  Pulse 74  Temp(Src) 98.3 F (36.8 C) (Oral)  Ht 5' 8.66" (1.744 m)  Wt 199 lb (90.266 kg)  BMI 29.68 kg/m2  GEN: WDWN, NAD, Non-toxic, A & O x 3 HEENT: Atraumatic, Normocephalic. Neck supple. No masses, No LAD. Ears and Nose: No external deformity. CV: RRR, No M/G/R. No JVD. No thrill. No extra heart sounds. PULM: CTA B, no wheezes,  crackles, rhonchi. No retractions. No resp. distress. No accessory muscle use. EXTR: No c/c/e NEURO Normal gait.  PSYCH: Normally interactive. Conversant. Not depressed or anxious appearing.  Calm demeanor.   Laboratory and Imaging Data:  Assessment and Plan:   High cholesterol  Hypertriglyceridemia  Great improvement  Follow-up: for CPX next year  Signed,  Gustaf Mccarter T. Haward Pope, MD   Patient's Medications  New Prescriptions   No medications on file  Previous Medications   ATORVASTATIN (LIPITOR) 40 MG TABLET    Take 1 tablet (40 mg total) by mouth daily at 6 PM.   CITALOPRAM (CELEXA) 40 MG TABLET    Take 1 tablet (40 mg total) by mouth daily.   HYDROCHLOROTHIAZIDE (HYDRODIURIL) 25 MG TABLET    Take 1 tablet (25 mg total) by mouth daily.   IBUPROFEN (ADVIL,MOTRIN) 200 MG TABLET    Take 800 mg by mouth every 6 (six) hours as needed for headache or moderate pain.   PANTOPRAZOLE (PROTONIX) 40 MG TABLET    Take 1 tablet (40 mg total) by mouth daily.   TAMSULOSIN HCL (FLOMAX) 0.4 MG CAPS    Take 0.4 mg by mouth Daily.    TRAMADOL (ULTRAM) 50 MG TABLET    Take 1 tablet (50 mg total) by mouth every 6 (six) hours as needed.  Modified Medications   No medications on file  Discontinued Medications   PREDNISONE (DELTASONE) 20 MG TABLET    2 tabs po for 5 days, then 1 tab po for 5 days   VARENICLINE (CHANTIX STARTING MONTH PAK) 0.5 MG X 11 & 1 MG X 42 TABLET    Take one 0.5mg  tab PO daily for 3 days, then increase to one 0.5mg  tab BID for 3 days, then increase to one 1mg  tablet BID   VARENICLINE (CHANTIX) 1 MG TABLET    Take 1 tablet (1 mg total) by mouth 2 (two) times daily. (Refills for starter pack)

## 2015-05-27 ENCOUNTER — Encounter: Payer: Self-pay | Admitting: Family Medicine

## 2015-05-27 DIAGNOSIS — E781 Pure hyperglyceridemia: Secondary | ICD-10-CM | POA: Insufficient documentation

## 2015-05-27 HISTORY — DX: Pure hyperglyceridemia: E78.1

## 2015-07-25 ENCOUNTER — Other Ambulatory Visit: Payer: Self-pay | Admitting: *Deleted

## 2015-07-25 MED ORDER — PANTOPRAZOLE SODIUM 40 MG PO TBEC: 40.0000 mg | DELAYED_RELEASE_TABLET | Freq: Every day | ORAL | Status: DC

## 2015-08-10 ENCOUNTER — Other Ambulatory Visit: Payer: Self-pay | Admitting: *Deleted

## 2015-08-10 MED ORDER — ATORVASTATIN CALCIUM 40 MG PO TABS: 40.0000 mg | ORAL_TABLET | Freq: Every day | ORAL | Status: DC

## 2015-09-30 ENCOUNTER — Other Ambulatory Visit: Payer: Self-pay | Admitting: *Deleted

## 2015-09-30 MED ORDER — CITALOPRAM HYDROBROMIDE 40 MG PO TABS: 40.0000 mg | ORAL_TABLET | Freq: Every day | ORAL | Status: DC

## 2016-01-19 ENCOUNTER — Telehealth: Payer: Self-pay | Admitting: *Deleted

## 2016-01-19 MED ORDER — HYDROCHLOROTHIAZIDE 25 MG PO TABS: 25.0000 mg | ORAL_TABLET | Freq: Every day | ORAL | Status: DC

## 2016-01-19 NOTE — Telephone Encounter (Signed)
Please schedule CPE with fasting labs for Dr. Copland. 

## 2016-01-19 NOTE — Telephone Encounter (Signed)
Labs 7/17 cpx 7/20 Please close

## 2016-01-27 ENCOUNTER — Other Ambulatory Visit: Payer: Self-pay | Admitting: *Deleted

## 2016-01-27 MED ORDER — CITALOPRAM HYDROBROMIDE 40 MG PO TABS: 40.0000 mg | ORAL_TABLET | Freq: Every day | ORAL | Status: DC

## 2016-02-09 ENCOUNTER — Other Ambulatory Visit: Payer: Self-pay | Admitting: *Deleted

## 2016-02-09 MED ORDER — PANTOPRAZOLE SODIUM 40 MG PO TBEC: 40.0000 mg | DELAYED_RELEASE_TABLET | Freq: Every day | ORAL | Status: DC

## 2016-02-20 ENCOUNTER — Other Ambulatory Visit (INDEPENDENT_AMBULATORY_CARE_PROVIDER_SITE_OTHER): Payer: BLUE CROSS/BLUE SHIELD

## 2016-02-20 ENCOUNTER — Other Ambulatory Visit: Payer: BLUE CROSS/BLUE SHIELD

## 2016-02-20 DIAGNOSIS — Z1159 Encounter for screening for other viral diseases: Secondary | ICD-10-CM

## 2016-02-20 DIAGNOSIS — Z0000 Encounter for general adult medical examination without abnormal findings: Secondary | ICD-10-CM

## 2016-02-20 LAB — COMPREHENSIVE METABOLIC PANEL
ALT: 11 U/L (ref 0–53)
AST: 14 U/L (ref 0–37)
Albumin: 4.5 g/dL (ref 3.5–5.2)
Alkaline Phosphatase: 57 U/L (ref 39–117)
BUN: 16 mg/dL (ref 6–23)
CHLORIDE: 101 meq/L (ref 96–112)
CO2: 30 meq/L (ref 19–32)
CREATININE: 1.27 mg/dL (ref 0.40–1.50)
Calcium: 9.7 mg/dL (ref 8.4–10.5)
GFR: 62.15 mL/min (ref >60.00)
GLUCOSE: 109 mg/dL — AB (ref 70–99)
POTASSIUM: 4.6 meq/L (ref 3.5–5.1)
SODIUM: 138 meq/L (ref 135–145)
Total Bilirubin: 0.5 mg/dL (ref 0.2–1.2)
Total Protein: 7.1 g/dL (ref 6.0–8.3)

## 2016-02-20 LAB — TSH: TSH: 2.44 u[IU]/mL (ref 0.35–4.50)

## 2016-02-20 LAB — LIPID PANEL
CHOL/HDL RATIO: 4
Cholesterol: 156 mg/dL (ref 0–200)
HDL: 35.4 mg/dL — ABNORMAL LOW (ref >39.00)
LDL CALC: 88 mg/dL (ref 0–99)
NONHDL: 120.95
Triglycerides: 164 mg/dL — ABNORMAL HIGH (ref 0.0–149.0)
VLDL: 32.8 mg/dL (ref 0.0–40.0)

## 2016-02-20 LAB — PSA: PSA: 0.02 ng/mL — ABNORMAL LOW (ref 0.10–4.00)

## 2016-02-21 LAB — CBC WITH DIFFERENTIAL/PLATELET
BASOS PCT: 0.8 % (ref 0.0–3.0)
Basophils Absolute: 0 10*3/uL (ref 0.0–0.1)
EOS PCT: 2.9 % (ref 0.0–5.0)
Eosinophils Absolute: 0.2 10*3/uL (ref 0.0–0.7)
HCT: 41.6 % (ref 39.0–52.0)
Hemoglobin: 14 g/dL (ref 13.0–17.0)
LYMPHS ABS: 1.4 10*3/uL (ref 0.7–4.0)
LYMPHS PCT: 26.5 % (ref 12.0–46.0)
MCHC: 33.7 g/dL (ref 30.0–36.0)
MCV: 88.5 fl (ref 78.0–100.0)
MONOS PCT: 12 % (ref 3.0–12.0)
Monocytes Absolute: 0.6 10*3/uL (ref 0.1–1.0)
NEUTROS ABS: 3 10*3/uL (ref 1.4–7.7)
NEUTROS PCT: 57.8 % (ref 43.0–77.0)
PLATELETS: 266 10*3/uL (ref 150.0–400.0)
RBC: 4.7 Mil/uL (ref 4.22–5.81)
RDW: 14.6 % (ref 11.5–15.5)
WBC: 5.2 10*3/uL (ref 4.0–10.5)

## 2016-02-21 LAB — HEPATITIS C ANTIBODY: HCV Ab: NEGATIVE

## 2016-02-23 ENCOUNTER — Encounter: Payer: BLUE CROSS/BLUE SHIELD | Admitting: Family Medicine

## 2016-02-29 ENCOUNTER — Encounter: Payer: Self-pay | Admitting: Family Medicine

## 2016-02-29 ENCOUNTER — Ambulatory Visit (INDEPENDENT_AMBULATORY_CARE_PROVIDER_SITE_OTHER): Payer: BLUE CROSS/BLUE SHIELD | Admitting: Family Medicine

## 2016-02-29 VITALS — BP 110/76 | HR 99 | Temp 98.3°F | Ht 68.5 in | Wt 197.5 lb

## 2016-02-29 DIAGNOSIS — Z0000 Encounter for general adult medical examination without abnormal findings: Secondary | ICD-10-CM | POA: Diagnosis not present

## 2016-02-29 MED ORDER — CITALOPRAM HYDROBROMIDE 20 MG PO TABS: 20.0000 mg | ORAL_TABLET | Freq: Every day | ORAL | 3 refills | Status: DC

## 2016-02-29 NOTE — Progress Notes (Signed)
Dr. Frederico Hamman T. Alyssah Algeo, MD, [facility] Primary [facility] and Sports Medicine [place], [address] Phone: [phone] Fax: [phone]  02/29/2016  Patient: Logan Norris, MRN: [id number], DOB: 01-12-1961, 55 y.o.  Primary Physician:  Owens Loffler, MD   Chief Complaint  Patient presents with  . Annual Exam   Subjective:   Logan Norris is a 55 y.o. pleasant patient who presents with the following:  Preventative Health Maintenance Visit:  Health Maintenance Summary Reviewed and updated, unless pt declines services.  Tobacco History Reviewed. Alcohol: No concerns, no excessive use Exercise Habits: none STD concerns: no risk or activity to increase risk Drug Use: None Encouraged self-testicular check  Health Maintenance  Topic Date Due  . HIV Screening  03/27/1974  . TETANUS/TDAP  02/28/2017 (Originally 08/07/2015)  . INFLUENZA VACCINE  03/06/2016  . COLONOSCOPY  01/18/2025  . Hepatitis C Screening  Completed   Immunization History  Administered Date(s) Administered  . Influenza Whole 06/06/2010  . Td 08/06/2005   Patient Active Problem List   Diagnosis Date Noted  . Hypertriglyceridemia 05/27/2015  . Obesity (BMI 30-39.9) 08/17/2013  . Bleeding ulcer, hx of 09/30/2012  . Tobacco abuse   . Anxiety 09/20/2011  . Prostate cancer ([place]) 10/23/2010  . HYPERTENSION 02/09/2010  . DEPRESSION 12/21/2009  . High cholesterol 01/19/2009  . GERD 01/19/2009  . Hx of diverticulitis 01/19/2009   Past Medical History:  Diagnosis Date  . Anxiety 09/20/2011  . Basal cell carcinoma   . Depression   . Diverticulitis of colon   . GERD (gastroesophageal reflux disease)   . H/O: gastrointestinal hemorrhage   . HLD (hyperlipidemia)   . HTN (hypertension)   . Hx of diverticulitis 01/19/2009   Centricity Description: DIVERTICULITIS, HX OF Qualifier: Diagnosis of  By: Lorelei Pont MD, Trevante Tennell   Centricity Description: GASTROINTESTINAL HEMORRHAGE, HX OF  Qualifier: Diagnosis of  By: Lorelei Pont MD, Frederico Hamman    . Hypertriglyceridemia 05/27/2015  . Prostate cancer ([place]) 10/23/2010   Qualifier: Diagnosis of  By: Lorelei Pont MD, Frederico Hamman    . Sinus tachycardia ([place]) 04/13   Holter: NSR, rare PAcs and PVCs  . Tobacco abuse    Past Surgical History:  Procedure Laterality Date  . INSERTION PROSTATE RADIATION SEED     Social History   Social History  . Marital status: Married    Spouse name: N/A  . Number of children: N/A  . Years of education: N/A   Occupational History  . [employer], [employer] History Main Topics  . Smoking status: Current Every Day Smoker    Packs/day: 1.00    Years: 35.00    Types: Cigarettes  . Smokeless tobacco: Never Used  . Alcohol use Yes     Comment: rare  . Drug use: No  . Sexual activity: Not on file   Other Topics Concern  . Not on file   Social History Narrative  . No narrative on file   Family History  Problem Relation Age of Onset  . Coronary artery disease Father     CABG x2  . Heart disease Father 68    CABG  . Stroke      Grandparent  . Breast cancer      Family history -1st degree relative <50  . Heart disease Paternal Grandfather    No Known Allergies  Medication list has been reviewed and updated.   General: ONGOING URI Eyes: Denies blurring,significant itching ENT: Denies  earache, sore throat, and hoarseness. Cardiovascular: Denies chest pains, palpitations, dyspnea on exertion Respiratory: Denies cough, dyspnea at rest,wheeezing Breast: no concerns about lumps GI: Denies nausea, vomiting, diarrhea, constipation, change in bowel habits, abdominal pain, melena, hematochezia GU: Denies penile discharge, ED, urinary flow / outflow problems. No STD concerns. Musculoskeletal: Denies back pain, joint pain Derm: Denies rash, itching Neuro: Denies  paresthesias, frequent falls, frequent headaches Psych: Denies depression,  anxiety Endocrine: Denies cold intolerance, heat intolerance, polydipsia Heme: Denies enlarged lymph nodes Allergy: No hayfever  Objective:   BP 110/76 (BP Location: Right Arm, Patient Position: Sitting, Cuff Size: Large)   Pulse 99   Temp 98.3 F (36.8 C) (Oral)   Ht 5' 8.5" (1.74 m)   Wt 197 lb 8 oz (89.6 kg)   BMI 29.59 kg/m  Ideal Body Weight: Weight in (lb) to have BMI = 25: 166.5  No exam data present  GEN: well developed, well nourished, no acute distress Eyes: conjunctiva and lids normal, PERRLA, EOMI ENT: TM clear, nares clear, oral exam WNL Neck: supple, no lymphadenopathy, no thyromegaly, no JVD Pulm: scattered wheezing CV: regular rate and rhythm, S1-S2, no murmur, rub or gallop, no bruits, peripheral pulses normal and symmetric, no cyanosis, clubbing, edema or varicosities GI: soft, non-tender; no hepatosplenomegaly, masses; active bowel sounds all quadrants GU: no hernia, testicular mass, penile discharge Lymph: no cervical, axillary or inguinal adenopathy MSK: gait normal, muscle tone and strength WNL, no joint swelling, effusions, discoloration, crepitus  SKIN: clear, good turgor, color WNL, no rashes, lesions, or ulcerations Neuro: normal mental status, normal strength, sensation, and motion Psych: alert; oriented to person, place and time, normally interactive and not anxious or depressed in appearance. All labs reviewed with patient.  Lipids:    Component Value Date/Time   CHOL 156 02/20/2016 1455   TRIG 164.0 (H) 02/20/2016 1455   HDL 35.40 (L) 02/20/2016 1455   LDLDIRECT 111.0 11/10/2014 1036   VLDL 32.8 02/20/2016 1455   CHOLHDL 4 02/20/2016 1455   CBC: CBC Latest Ref Rng & Units 02/20/2016 11/10/2014 05/09/2014  WBC 4.0 - 10.5 K/uL 5.2 6.5 8.5  Hemoglobin 13.0 - 17.0 g/dL 14.0 14.1 14.6  Hematocrit 39.0 - 52.0 % 41.6 41.3 44.3  Platelets 150.0 - 400.0 K/uL 266.0 292.0 176    Basic Metabolic Panel:    Component Value Date/Time   NA 138  02/20/2016 1455   K 4.6 02/20/2016 1455   CL 101 02/20/2016 1455   CO2 30 02/20/2016 1455   BUN 16 02/20/2016 1455   CREATININE 1.27 02/20/2016 1455   GLUCOSE 109 (H) 02/20/2016 1455   CALCIUM 9.7 02/20/2016 1455   Hepatic Function Latest Ref Rng & Units 02/20/2016 11/10/2014 08/14/2013  Total Protein 6.0 - 8.3 g/dL 7.1 7.1 7.3  Albumin 3.5 - 5.2 g/dL 4.5 4.4 4.5  AST 0 - 37 U/L '14 16 18  ' ALT 0 - 53 U/L '11 14 22  ' Alk Phosphatase 39 - 117 U/L 57 59 49  Total Bilirubin 0.2 - 1.2 mg/dL 0.5 0.4 0.8  Bilirubin, Direct 0.0 - 0.3 mg/dL - 0.1 0.0    Lab Results  Component Value Date   TSH 2.44 02/20/2016   Lab Results  Component Value Date   PSA 0.02 (L) 02/20/2016   PSA 0.08 (L) 11/10/2014   PSA 4.43 (H) 10/17/2010    Assessment and Plan:   Healthcare maintenance  Health Maintenance Exam: The patient's preventative maintenance and recommended screening tests for an annual wellness exam were  reviewed in full today. Brought up to date unless services declined.  Counselled on the importance of diet, exercise, and its role in overall health and mortality. The patient's FH and SH was reviewed, including their home life, tobacco status, and drug and alcohol status.  Follow-up: No Follow-up on file. Unless noted, follow-up in 1 year for Health Maintenance Exam.  Modified Medications   Modified Medication Previous Medication   CITALOPRAM (CELEXA) 20 MG TABLET citalopram (CELEXA) 40 MG tablet      Take 1 tablet (20 mg total) by mouth daily.    Take 1 tablet (40 mg total) by mouth daily.   No orders of the defined types were placed in this encounter.   Signed,  Maud Deed. Samwise Eckardt, MD   Patient's Medications  New Prescriptions   No medications on file  Previous Medications   ATORVASTATIN (LIPITOR) 40 MG TABLET    Take 1 tablet (40 mg total) by mouth daily at 6 PM.   HYDROCHLOROTHIAZIDE (HYDRODIURIL) 25 MG TABLET    Take 1 tablet (25 mg total) by mouth daily.   IBUPROFEN  (ADVIL,MOTRIN) 200 MG TABLET    Take 800 mg by mouth every 6 (six) hours as needed for headache or moderate pain.   PANTOPRAZOLE (PROTONIX) 40 MG TABLET    Take 1 tablet (40 mg total) by mouth daily.   TAMSULOSIN HCL (FLOMAX) 0.4 MG CAPS    Take 0.4 mg by mouth Daily.    TRAMADOL (ULTRAM) 50 MG TABLET    Take 1 tablet (50 mg total) by mouth every 6 (six) hours as needed.  Modified Medications   Modified Medication Previous Medication   CITALOPRAM (CELEXA) 20 MG TABLET citalopram (CELEXA) 40 MG tablet      Take 1 tablet (20 mg total) by mouth daily.    Take 1 tablet (40 mg total) by mouth daily.  Discontinued Medications   No medications on file

## 2016-02-29 NOTE — Progress Notes (Signed)
Pre visit review using our clinic review tool, if applicable. No additional management support is needed unless otherwise documented below in the visit note. 

## 2016-03-15 ENCOUNTER — Other Ambulatory Visit: Payer: Self-pay

## 2016-03-15 MED ORDER — PANTOPRAZOLE SODIUM 40 MG PO TBEC: 40.0000 mg | DELAYED_RELEASE_TABLET | Freq: Every day | ORAL | 11 refills | Status: DC

## 2016-03-15 NOTE — Telephone Encounter (Signed)
Rx sent electronically.  

## 2016-05-09 ENCOUNTER — Other Ambulatory Visit: Payer: Self-pay | Admitting: *Deleted

## 2016-05-09 MED ORDER — HYDROCHLOROTHIAZIDE 25 MG PO TABS: 25.0000 mg | ORAL_TABLET | Freq: Every day | ORAL | 1 refills | Status: DC

## 2016-06-12 ENCOUNTER — Other Ambulatory Visit: Payer: Self-pay | Admitting: *Deleted

## 2016-06-12 MED ORDER — CITALOPRAM HYDROBROMIDE 20 MG PO TABS: 20.0000 mg | ORAL_TABLET | Freq: Every day | ORAL | 5 refills | Status: DC

## 2016-08-29 ENCOUNTER — Other Ambulatory Visit: Payer: Self-pay | Admitting: Family Medicine

## 2016-11-09 ENCOUNTER — Other Ambulatory Visit: Payer: Self-pay | Admitting: *Deleted

## 2016-11-09 MED ORDER — TAMSULOSIN HCL 0.4 MG PO CAPS: 0.4000 mg | ORAL_CAPSULE | Freq: Every day | ORAL | 5 refills | Status: DC

## 2016-11-09 NOTE — Telephone Encounter (Signed)
Received fax from [facility] requesting refills.  States Rx originally written by Dr. Risa Grill but patient requested refills request be sent to Dr. Lorelei Pont.  Last office visit 02/29/2016.  Ok to refill?

## 2016-11-09 NOTE — Telephone Encounter (Signed)
Ok to refill 6 months worth, 30 or 90 day supply

## 2016-11-20 ENCOUNTER — Other Ambulatory Visit: Payer: Self-pay | Admitting: Family Medicine

## 2016-11-23 ENCOUNTER — Telehealth: Payer: Self-pay

## 2016-11-23 ENCOUNTER — Other Ambulatory Visit: Payer: Self-pay | Admitting: Family Medicine

## 2016-11-23 MED ORDER — SILDENAFIL CITRATE 20 MG PO TABS: ORAL_TABLET | ORAL | 11 refills | Status: DC

## 2016-11-23 NOTE — Telephone Encounter (Signed)
I will call him. Multifactorial issue in a male almost 5. Has been on this med for 6 years. Not unreasonable idea.

## 2016-11-23 NOTE — Telephone Encounter (Signed)
Pt left v/m requesting different med to take place of citalopram; experiencing side effects of med, problem with ED. Pt just got refill and was reading literature that came with med. Pt request cb after Dr Lorelei Pont reviews. [place]. Last annual 02/29/16.

## 2016-11-23 NOTE — Telephone Encounter (Signed)
We are going to decrease celexa dose to 10 mg x 1 month and then d/c.  Sent in some Revatio for him

## 2016-12-27 ENCOUNTER — Telehealth: Payer: Self-pay | Admitting: Family Medicine

## 2016-12-27 ENCOUNTER — Ambulatory Visit (INDEPENDENT_AMBULATORY_CARE_PROVIDER_SITE_OTHER): Payer: BLUE CROSS/BLUE SHIELD | Admitting: *Deleted

## 2016-12-27 DIAGNOSIS — Z23 Encounter for immunization: Secondary | ICD-10-CM

## 2016-12-27 NOTE — Telephone Encounter (Signed)
[facility] Patient Name: Logan Norris DOB: 1961/04/08 Initial Comment Caller states pulled a tick off of him Mon, it is red, irritated, and itchy Nurse Assessment Nurse: Markus Daft, RN, [place] Date/Time (Eastern Time): 12/27/2016 2:25:08 PM Confirm and document reason for call. If symptomatic, describe symptoms. ---Caller states that he had a tick bite on Monday on his mid abdomen, and the area is red about quarter size - 1 inch, irritated, and itchy since Monday. Sprayed with cortisone sprayed, and applied antibiotic ointment yesterday. No fever, headache, rash. He had 3 other ticks on him but were not attached. Size of a pen head. Does the patient have any new or worsening symptoms? ---Yes Will a triage be completed? ---Yes Related visit to physician within the last 2 weeks? ---No Does the PT have any chronic conditions? (i.e. diabetes, asthma, etc.) ---Unknown Is this a behavioral health or substance abuse call? ---No Guidelines Guideline Title Affirmed Question Affirmed Notes Tick Bite Tick bite with no complications Final Disposition [place], RN, Sherre Poot Comments Last Tetanus shot was in 2007. He will call office to have this set up today. Disagree/Comply: Comply

## 2016-12-27 NOTE — Telephone Encounter (Signed)
Pt has nurse visit for tetanus shot today.

## 2017-01-03 ENCOUNTER — Other Ambulatory Visit: Payer: Self-pay | Admitting: Family Medicine

## 2017-02-01 ENCOUNTER — Encounter: Payer: Self-pay | Admitting: Family Medicine

## 2017-02-01 ENCOUNTER — Ambulatory Visit (INDEPENDENT_AMBULATORY_CARE_PROVIDER_SITE_OTHER): Payer: BLUE CROSS/BLUE SHIELD | Admitting: Family Medicine

## 2017-02-01 VITALS — BP 126/78 | HR 86 | Temp 98.6°F | Ht 68.5 in | Wt 205.6 lb

## 2017-02-01 DIAGNOSIS — R195 Other fecal abnormalities: Secondary | ICD-10-CM | POA: Diagnosis not present

## 2017-02-01 LAB — CBC WITH DIFFERENTIAL/PLATELET
BASOS PCT: 1 % (ref 0.0–3.0)
Basophils Absolute: 0.1 10*3/uL (ref 0.0–0.1)
EOS ABS: 0.2 10*3/uL (ref 0.0–0.7)
Eosinophils Relative: 2.3 % (ref 0.0–5.0)
HEMATOCRIT: 43.8 % (ref 39.0–52.0)
HEMOGLOBIN: 15 g/dL (ref 13.0–17.0)
LYMPHS PCT: 27.7 % (ref 12.0–46.0)
Lymphs Abs: 2.4 10*3/uL (ref 0.7–4.0)
MCHC: 34.3 g/dL (ref 30.0–36.0)
MCV: 88.6 fl (ref 78.0–100.0)
Monocytes Absolute: 0.6 10*3/uL (ref 0.1–1.0)
Monocytes Relative: 7.4 % (ref 3.0–12.0)
Neutro Abs: 5.3 10*3/uL (ref 1.4–7.7)
Neutrophils Relative %: 61.6 % (ref 43.0–77.0)
Platelets: 308 10*3/uL (ref 150.0–400.0)
RBC: 4.94 Mil/uL (ref 4.22–5.81)
RDW: 14.1 % (ref 11.5–15.5)
WBC: 8.6 10*3/uL (ref 4.0–10.5)

## 2017-02-01 NOTE — Patient Instructions (Signed)
Give us 2-3 business days to get the results of your labs back.  Let us know if you need anything.  

## 2017-02-01 NOTE — Progress Notes (Signed)
Chief Complaint  Patient presents with  . Dark stool    noticed x 3 days-hx of diverticulsis    Subjective: Patient is a 56 y.o. male here for dark stool.  He has noticed this over the past 3 days. He does have a history of diverticulosis but states this is not similar. He is not having any pain. He did eat red meat twice this week prior to this started. No pain, fevers, N/V/D/C, areas of easy bruising/bleeding, light headedness, recent Fe supplementation, Peptobismol use, or injury. He would like labs to make sure he is not losing blood.  ROS: Const: No fevers GI: As noted in HPI  Family History  Problem Relation Age of Onset  . Coronary artery disease Father        CABG x2  . Heart disease Father 72       CABG  . Stroke Unknown        Grandparent  . Breast cancer Unknown        Family history -1st degree relative <50  . Heart disease Paternal Grandfather    Past Medical History:  Diagnosis Date  . Anxiety 09/20/2011  . Basal cell carcinoma   . Depression   . Diverticulitis of colon   . GERD (gastroesophageal reflux disease)   . H/O: gastrointestinal hemorrhage   . HLD (hyperlipidemia)   . HTN (hypertension)   . Hx of diverticulitis 01/19/2009   Centricity Description: DIVERTICULITIS, HX OF Qualifier: Diagnosis of  By: Lorelei Pont MD, [place]   Centricity Description: GASTROINTESTINAL HEMORRHAGE, HX OF Qualifier: Diagnosis of  By: Lorelei Pont MD, Frederico Hamman    . Hypertriglyceridemia 05/27/2015  . Prostate cancer ([place]) 10/23/2010   Qualifier: Diagnosis of  By: Lorelei Pont MD, Frederico Hamman    . Sinus tachycardia 04/13   Holter: NSR, rare PAcs and PVCs  . Tobacco abuse    No Known Allergies  Current Outpatient Prescriptions:  .  atorvastatin (LIPITOR) 40 MG tablet, TAKE ONE TABLET BY MOUTH DAILY AT 6PM, Disp: 30 tablet, Rfl: 5 .  citalopram (CELEXA) 20 MG tablet, Take 1 tablet (20 mg total) by mouth daily., Disp: 30 tablet, Rfl: 5 .  hydrochlorothiazide (HYDRODIURIL) 25 MG tablet, Take 1  tablet (25 mg total) by mouth daily., Disp: 90 tablet, Rfl: 0 .  ibuprofen (ADVIL,MOTRIN) 200 MG tablet, Take 800 mg by mouth every 6 (six) hours as needed for headache or moderate pain., Disp: , Rfl:  .  pantoprazole (PROTONIX) 40 MG tablet, Take 1 tablet (40 mg total) by mouth daily., Disp: 30 tablet, Rfl: 11 .  sildenafil (REVATIO) 20 MG tablet, 2 to 5 tabs 30 mins prior to intercourse. Make sure he understands this is cash not to be run through insurance., Disp: 50 tablet, Rfl: 11 .  tamsulosin (FLOMAX) 0.4 MG CAPS capsule, Take 1 capsule (0.4 mg total) by mouth daily., Disp: 30 capsule, Rfl: 5  Objective: BP 126/78 (BP Location: Left Arm, Patient Position: Sitting, Cuff Size: Normal)   Pulse 86   Temp 98.6 F (37 C) (Oral)   Ht 5' 8.5" (1.74 m)   Wt 205 lb 9.6 oz (93.3 kg)   SpO2 96%   BMI 30.81 kg/m  General: Awake, appears stated age HEENT: MMM, EOMi Heart: RRR, no murmurs Lungs: CTAB, no rales, wheezes or rhonchi. No accessory muscle use Abd: BS+, soft, NT, ND, no masses or organomegaly Psych: Age appropriate judgment and insight, normal affect and mood  Assessment and Plan: Dark stools - Plan: CBC w/Diff, Fecal occult  blood, imunochemical  Orders as above. Hopefully it was something he ate. Let us know if anything changes.  F/u w reg PCP prn. The patient voiced understanding and agreement to the plan.  [place], DO 02/01/17  10:26 AM

## 2017-02-04 ENCOUNTER — Encounter: Payer: Self-pay | Admitting: Family Medicine

## 2017-02-04 ENCOUNTER — Ambulatory Visit (INDEPENDENT_AMBULATORY_CARE_PROVIDER_SITE_OTHER): Payer: BLUE CROSS/BLUE SHIELD | Admitting: Family Medicine

## 2017-02-04 DIAGNOSIS — T63451A Toxic effect of venom of hornets, accidental (unintentional), initial encounter: Secondary | ICD-10-CM | POA: Diagnosis not present

## 2017-02-04 NOTE — Patient Instructions (Signed)
Nice to see you. You can take over-the-counter zyrtec once daily to help with the itching. If this does not help I would switch to Benadryl 25 mg every 8 hours. You should use cold compresses and ice to help with the swelling as well. If you develop spreading redness, increased swelling, or any new or change in symptoms we seek medical attention immediately.

## 2017-02-04 NOTE — Assessment & Plan Note (Signed)
Patient with evidence of local reaction to a hornet sting. No signs of cellulitis or infection. There is swelling to some degree in the hand. Initially discussed prednisone though given his history of a bleeding ulcer in the past we decided to defer this. He will try Zyrtec. If this is not beneficial he will switch to Benadryl. He'll use ice and cold compresses. Given return precautions.

## 2017-02-04 NOTE — Progress Notes (Signed)
  Tommi Rumps, MD Phone: [phone]  Logan Norris is a 56 y.o. male who presents today for same-day visit.  Patient reports he was stung by a hornet on his right dorsum thumb on 3 days ago. Noted some swelling starting on Saturday into his hand. Some pain at the site of the sting. He's had no fevers. No spreading redness. No history of prior reaction. No throat swelling or trouble breathing. He notes the swelling is about the same as it has been since Saturday.  ROS see history of present illness  Objective  Physical Exam Vitals:   02/04/17 1032  BP: 140/88  Pulse: 85  Temp: 98.5 F (36.9 C)    BP Readings from Last 3 Encounters:  02/04/17 140/88  02/01/17 126/78  02/29/16 110/76   Wt Readings from Last 3 Encounters:  02/04/17 205 lb 9.6 oz (93.3 kg)  02/01/17 205 lb 9.6 oz (93.3 kg)  02/29/16 197 lb 8 oz (89.6 kg)    Physical Exam  Constitutional: No distress.  Cardiovascular: Normal rate, regular rhythm and normal heart sounds.   Neurological: He is alert. Gait normal.  Skin: Skin is warm and dry. He is not diaphoretic.  Right dorsum thumb distal phalanx with area of swelling, no purulence, no fluctuance, there is slight erythema surrounding the area of swelling, no erythema of the thumb or in his hand, there is swelling in his hand, 5 out of 5 grip strength in the right hand, good capillary refill in the right thumb, hand is warm and well-perfused     Assessment/Plan: Please see individual problem list.  Hornet sting Patient with evidence of local reaction to a hornet sting. No signs of cellulitis or infection. There is swelling to some degree in the hand. Initially discussed prednisone though given his history of a bleeding ulcer in the past we decided to defer this. He will try Zyrtec. If this is not beneficial he will switch to Benadryl. He'll use ice and cold compresses. Given return precautions.  Tommi Rumps, MD [place]

## 2017-02-28 ENCOUNTER — Other Ambulatory Visit: Payer: Self-pay | Admitting: Family Medicine

## 2017-04-10 ENCOUNTER — Other Ambulatory Visit: Payer: Self-pay | Admitting: Family Medicine

## 2017-04-26 ENCOUNTER — Other Ambulatory Visit: Payer: Self-pay | Admitting: Family Medicine

## 2017-05-08 ENCOUNTER — Ambulatory Visit: Payer: BLUE CROSS/BLUE SHIELD | Admitting: Family Medicine

## 2017-05-13 ENCOUNTER — Ambulatory Visit (INDEPENDENT_AMBULATORY_CARE_PROVIDER_SITE_OTHER): Payer: Managed Care, Other (non HMO) | Admitting: Family Medicine

## 2017-05-13 ENCOUNTER — Encounter: Payer: Self-pay | Admitting: Family Medicine

## 2017-05-13 VITALS — BP 110/70 | HR 92 | Temp 98.2°F | Ht 68.5 in | Wt 198.5 lb

## 2017-05-13 DIAGNOSIS — F411 Generalized anxiety disorder: Secondary | ICD-10-CM | POA: Insufficient documentation

## 2017-05-13 DIAGNOSIS — F331 Major depressive disorder, recurrent, moderate: Secondary | ICD-10-CM | POA: Insufficient documentation

## 2017-05-13 MED ORDER — CLONAZEPAM 0.5 MG PO TABS: 0.5000 mg | ORAL_TABLET | Freq: Two times a day (BID) | ORAL | 1 refills | Status: DC | PRN

## 2017-05-13 MED ORDER — CITALOPRAM HYDROBROMIDE 40 MG PO TABS: 40.0000 mg | ORAL_TABLET | Freq: Every day | ORAL | 1 refills | Status: DC

## 2017-05-13 NOTE — Progress Notes (Signed)
   Dr. Frederico Hamman T. Pavan Bring, MD, [facility] and Sports Medicine [place], [address] Phone: [phone] Fax: [phone]  05/13/2017  Patient: Logan Norris, MRN: [id number], DOB: Dec 13, 1960, 56 y.o.  Primary Physician:  Owens Loffler, MD   Chief Complaint  Patient presents with  . Anxiety    >25 minutes spent in face to face time with patient, >50% spent in counselling or coordination of care   Anxiety, mind is flushed and mind ? Racing. Work is stressing him out. A couple of months. Depression is also worse. Son moved to [place]. Not himself.. Waking up a few times a night. Work. Anhedonia. Some fleeting thoughts of SI - no intentional goal. No HI.  Definite anxiety, attacks     Depression, major, recurrent, moderate (HCC)  GAD (generalized anxiety disorder)  Acute bid klonopin, increase celexa dose  Follow-up: Return in about 6 weeks (around 06/24/2017).  Future Appointments Date Time Provider [place]  06/24/2017 12:00 PM Hiroko Tregre, Frederico Hamman, MD [facility]    Meds ordered this encounter  Medications  . citalopram (CELEXA) 40 MG tablet    Sig: Take 1 tablet (40 mg total) by mouth daily.    Dispense:  90 tablet    Refill:  1  . clonazePAM (KLONOPIN) 0.5 MG tablet    Sig: Take 1 tablet (0.5 mg total) by mouth 2 (two) times daily as needed for anxiety.    Dispense:  60 tablet    Refill:  1   Medications Discontinued During This Encounter  Medication Reason  . citalopram (CELEXA) 20 MG tablet Reorder   Signed,  Frederico Hamman T. Olla Delancey, MD   Patient's Medications  New Prescriptions   CLONAZEPAM (KLONOPIN) 0.5 MG TABLET    Take 1 tablet (0.5 mg total) by mouth 2 (two) times daily as needed for anxiety.  Previous Medications   ATORVASTATIN (LIPITOR) 40 MG TABLET    TAKE ONE TABLET BY MOUTH DAILY AT 6PM   HYDROCHLOROTHIAZIDE (HYDRODIURIL) 25 MG TABLET    TAKE ONE TABLET BY MOUTH EVERY DAY. NEEDOFFICE  VISIT!   HYDROCHLOROTHIAZIDE (HYDRODIURIL) 25 MG TABLET    TAKE ONE TABLET BY MOUTH EVERY DAY. NEEDOFFICE VISIT!   IBUPROFEN (ADVIL,MOTRIN) 200 MG TABLET    Take 800 mg by mouth every 6 (six) hours as needed for headache or moderate pain.   PANTOPRAZOLE (PROTONIX) 40 MG TABLET    TAKE ONE TABLET BY MOUTH EVERY DAY   SILDENAFIL (REVATIO) 20 MG TABLET    2 to 5 tabs 30 mins prior to intercourse. Make sure he understands this is cash not to be run through insurance.   TAMSULOSIN (FLOMAX) 0.4 MG CAPS CAPSULE    Take 1 capsule (0.4 mg total) by mouth daily.  Modified Medications   Modified Medication Previous Medication   CITALOPRAM (CELEXA) 40 MG TABLET citalopram (CELEXA) 20 MG tablet      Take 1 tablet (40 mg total) by mouth daily.    Take 1 tablet (20 mg total) by mouth daily.  Discontinued Medications   No medications on file

## 2017-05-15 ENCOUNTER — Other Ambulatory Visit: Payer: Self-pay | Admitting: Family Medicine

## 2017-05-20 ENCOUNTER — Other Ambulatory Visit: Payer: Self-pay | Admitting: Family Medicine

## 2017-05-20 NOTE — Telephone Encounter (Signed)
Last office visit 05/13/2017.  Last refilled 08/29/2016 for #30 with 5 refills.  Last Lipid 02/20/2016.  Ok to refill?

## 2017-06-24 ENCOUNTER — Other Ambulatory Visit: Payer: Self-pay

## 2017-06-24 ENCOUNTER — Encounter: Payer: Self-pay | Admitting: Family Medicine

## 2017-06-24 ENCOUNTER — Ambulatory Visit: Payer: Managed Care, Other (non HMO) | Admitting: Family Medicine

## 2017-06-24 VITALS — BP 102/68 | HR 101 | Temp 98.4°F | Ht 68.5 in | Wt 198.0 lb

## 2017-06-24 DIAGNOSIS — F331 Major depressive disorder, recurrent, moderate: Secondary | ICD-10-CM

## 2017-06-24 DIAGNOSIS — F411 Generalized anxiety disorder: Secondary | ICD-10-CM

## 2017-06-24 MED ORDER — CLONAZEPAM 0.5 MG PO TABS: 0.5000 mg | ORAL_TABLET | Freq: Two times a day (BID) | ORAL | 1 refills | Status: DC | PRN

## 2017-06-24 NOTE — Progress Notes (Signed)
Dr. Frederico Hamman T. Chyenne Sobczak, MD, [facility] Primary [facility] and Sports Medicine [place], [address] Phone: [phone] Fax: [phone]  06/24/2017  Patient: Logan Norris, MRN: [id number], DOB: [birth year], 56 y.o.  Primary Physician:  Owens Loffler, MD   Chief Complaint  Patient presents with  . Follow-up    Depression/Anxiety   Subjective:   Logan Norris is a 56 y.o. very pleasant male patient who presents with the following:  F/u dep / anxiety.   Past Medical History, Surgical History, Social History, Family History, Problem List, Medications, and Allergies have been reviewed and updated if relevant.  Patient Active Problem List   Diagnosis Date Noted  . Depression, major, recurrent, moderate ([place]) 05/13/2017  . GAD (generalized anxiety disorder) 05/13/2017  . Hornet sting 02/04/2017  . Hypertriglyceridemia 05/27/2015  . Obesity (BMI 30-39.9) 08/17/2013  . Bleeding ulcer, hx of 09/30/2012  . Tobacco abuse   . Anxiety 09/20/2011  . Prostate cancer ([place]) 10/23/2010  . HYPERTENSION 02/09/2010  . DEPRESSION 12/21/2009  . High cholesterol 01/19/2009  . GERD 01/19/2009  . Hx of diverticulitis 01/19/2009    Past Medical History:  Diagnosis Date  . Anxiety 09/20/2011  . Basal cell carcinoma   . Depression   . Diverticulitis of colon   . GERD (gastroesophageal reflux disease)   . H/O: gastrointestinal hemorrhage   . HLD (hyperlipidemia)   . HTN (hypertension)   . Hx of diverticulitis 01/19/2009   Centricity Description: DIVERTICULITIS, HX OF Qualifier: Diagnosis of  By: Lorelei Pont MD, Bianca Vester   Centricity Description: GASTROINTESTINAL HEMORRHAGE, HX OF Qualifier: Diagnosis of  By: Lorelei Pont MD, Frederico Hamman    . Hypertriglyceridemia 05/27/2015  . Prostate cancer ([place]) 10/23/2010   Qualifier: Diagnosis of  By: Lorelei Pont MD, Frederico Hamman    . Sinus tachycardia 04/13   Holter: NSR, rare PAcs and PVCs  . Tobacco abuse     Past Surgical History:    Procedure Laterality Date  . INSERTION PROSTATE RADIATION SEED      Social History   Socioeconomic History  . Marital status: Married    Spouse name: Not on file  . Number of children: Not on file  . Years of education: Not on file  . Highest education level: Not on file  Social Needs  . Financial resource strain: Not on file  . Food insecurity - worry: Not on file  . Food insecurity - inability: Not on file  . Transportation needs - medical: Not on file  . Transportation needs - non-medical: Not on file  Occupational History  . Occupation: Copy, [place]: [employer]  Tobacco Use  . Smoking status: Former Smoker    Packs/day: 1.00    Years: 35.00    Pack years: 35.00    Types: Cigarettes  . Smokeless tobacco: Never Used  Substance and Sexual Activity  . Alcohol use: Yes    Comment: rare  . Drug use: No  . Sexual activity: Not on file  Other Topics Concern  . Not on file  Social History Narrative  . Not on file    Family History  Problem Relation Age of Onset  . Coronary artery disease Father        CABG x2  . Heart disease Father 38       CABG  . Stroke Unknown        Grandparent  . Breast cancer Unknown        Family  history -1st degree relative <50  . Heart disease Paternal Grandfather     No Known Allergies  Medication list reviewed and updated in full in [facility].   GEN: No acute illnesses, no fevers, chills. GI: No n/v/d, eating normally Pulm: No SOB Interactive and getting along well at home.  Otherwise, ROS is as per the HPI.  Objective:   BP 102/68   Pulse (!) 101   Temp 98.4 F (36.9 C) (Oral)   Ht 5' 8.5" (1.74 m)   Wt 198 lb (89.8 kg)   BMI 29.67 kg/m   GEN: WDWN, NAD, Non-toxic, A & O x 3 HEENT: Atraumatic, Normocephalic. Neck supple. No masses, No LAD. Ears and Nose: No external deformity. CV: RRR, No M/G/R. No JVD. No thrill. No extra heart sounds. PULM: CTA B, no  wheezes, crackles, rhonchi. No retractions. No resp. distress. No accessory muscle use. EXTR: No c/c/e NEURO Normal gait.  PSYCH: Normally interactive. Conversant. Not depressed or anxious appearing.  Calm demeanor.   Laboratory and Imaging Data:  Assessment and Plan:   Depression, major, recurrent, moderate (HCC)  GAD (generalized anxiety disorder)  Doing better on celexa 40 - prob get more benefit in a few weeks  Follow-up: No Follow-up on file.  Meds ordered this encounter  Medications  . clonazePAM (KLONOPIN) 0.5 MG tablet    Sig: Take 1 tablet (0.5 mg total) 2 (two) times daily as needed by mouth for anxiety.    Dispense:  60 tablet    Refill:  1   Medications Discontinued During This Encounter  Medication Reason  . clonazePAM (KLONOPIN) 0.5 MG tablet Reorder   Signed,  Frederico Hamman T. Deeanna Beightol, MD   Allergies as of 06/24/2017   No Known Allergies     Medication List        Accurate as of 06/24/17  1:42 PM. Always use your most recent med list.          atorvastatin 40 MG tablet Commonly known as:  LIPITOR TAKE ONE TABLET BY MOUTH EVERY DAY AT 6PM   citalopram 40 MG tablet Commonly known as:  CELEXA Take 1 tablet (40 mg total) by mouth daily.   clonazePAM 0.5 MG tablet Commonly known as:  KLONOPIN Take 1 tablet (0.5 mg total) 2 (two) times daily as needed by mouth for anxiety.   hydrochlorothiazide 25 MG tablet Commonly known as:  HYDRODIURIL TAKE ONE TABLET BY MOUTH EVERY DAY. NEEDOFFICE VISIT!   hydrochlorothiazide 25 MG tablet Commonly known as:  HYDRODIURIL TAKE ONE TABLET BY MOUTH EVERY DAY. NEEDOFFICE VISIT!   ibuprofen 200 MG tablet Commonly known as:  ADVIL,MOTRIN Take 800 mg by mouth every 6 (six) hours as needed for headache or moderate pain.   pantoprazole 40 MG tablet Commonly known as:  PROTONIX TAKE ONE TABLET BY MOUTH EVERY DAY   sildenafil 20 MG tablet Commonly known as:  REVATIO 2 to 5 tabs 30 mins prior to intercourse. Make  sure he understands this is cash not to be run through insurance.   tamsulosin 0.4 MG Caps capsule Commonly known as:  FLOMAX Take 1 capsule (0.4 mg total) by mouth daily.

## 2017-06-29 ENCOUNTER — Other Ambulatory Visit: Payer: Self-pay | Admitting: Family Medicine

## 2017-09-02 ENCOUNTER — Encounter: Payer: Self-pay | Admitting: Family Medicine

## 2017-09-02 ENCOUNTER — Other Ambulatory Visit: Payer: Self-pay

## 2017-09-02 ENCOUNTER — Ambulatory Visit: Payer: Managed Care, Other (non HMO) | Admitting: Family Medicine

## 2017-09-02 VITALS — BP 112/68 | HR 108 | Temp 99.0°F | Ht 68.5 in | Wt 193.8 lb

## 2017-09-02 DIAGNOSIS — F331 Major depressive disorder, recurrent, moderate: Secondary | ICD-10-CM

## 2017-09-02 DIAGNOSIS — K4090 Unilateral inguinal hernia, without obstruction or gangrene, not specified as recurrent: Secondary | ICD-10-CM | POA: Insufficient documentation

## 2017-09-02 MED ORDER — BUPROPION HCL ER (XL) 150 MG PO TB24: 150.0000 mg | ORAL_TABLET | Freq: Every day | ORAL | 2 refills | Status: DC

## 2017-09-02 NOTE — Progress Notes (Signed)
Dr. Frederico Hamman T. Sherol Sabas, MD, [facility] Primary [facility] and Sports Medicine [place], [address] Phone: [phone] Fax: [phone]  09/02/2017  Patient: Logan Norris, Logan Norris, Logan Norris, 57 y.o.  Primary Physician:  Owens Loffler, MD   Chief Complaint  Patient presents with  . Swelling in Groin  . Depression   Subjective:   Logan Norris is a 57 y.o. very pleasant male patient who presents with the following:  Depression is doing worse and does not feel like doing anything. Driving him nuts. Has some swelling above his penis.   L inguinal hernia.   Past Medical History, Surgical History, Social History, Family History, Problem List, Medications, and Allergies have been reviewed and updated if relevant.  Patient Active Problem List   Diagnosis Date Noted  . Depression, major, recurrent, moderate ([place]) 05/13/2017  . GAD (generalized anxiety disorder) 05/13/2017  . Hornet sting 02/04/2017  . Hypertriglyceridemia 05/27/2015  . Obesity (BMI 30-39.9) 08/17/2013  . Bleeding ulcer, hx of 09/30/2012  . Tobacco abuse   . Anxiety 09/20/2011  . Prostate cancer ([place]) 10/23/2010  . HYPERTENSION 02/09/2010  . DEPRESSION 12/21/2009  . High cholesterol 01/19/2009  . GERD 01/19/2009  . Hx of diverticulitis 01/19/2009    Past Medical History:  Diagnosis Date  . Anxiety 09/20/2011  . Basal cell carcinoma   . Depression   . Diverticulitis of colon   . GERD (gastroesophageal reflux disease)   . H/O: gastrointestinal hemorrhage   . HLD (hyperlipidemia)   . HTN (hypertension)   . Hx of diverticulitis 01/19/2009   Centricity Description: DIVERTICULITIS, HX OF Qualifier: Diagnosis of  By: Lorelei Pont MD, Nethra Mehlberg   Centricity Description: GASTROINTESTINAL HEMORRHAGE, HX OF Qualifier: Diagnosis of  By: Lorelei Pont MD, Frederico Hamman    . Hypertriglyceridemia 05/27/2015  . Prostate cancer ([place]) 10/23/2010   Qualifier: Diagnosis of  By: Lorelei Pont MD,  Frederico Hamman    . Sinus tachycardia 04/13   Holter: NSR, rare PAcs and PVCs  . Tobacco abuse     Past Surgical History:  Procedure Laterality Date  . INSERTION PROSTATE RADIATION SEED      Social History   Socioeconomic History  . Marital status: Married    Spouse name: Not on file  . Number of children: Not on file  . Years of education: Not on file  . Highest education level: Not on file  Social Needs  . Financial resource strain: Not on file  . Food insecurity - worry: Not on file  . Food insecurity - inability: Not on file  . Transportation needs - medical: Not on file  . Transportation needs - non-medical: Not on file  Occupational History  . Occupation: Copy, [place]: [employer]  Tobacco Use  . Smoking status: Former Smoker    Packs/day: 1.00    Years: 35.00    Pack years: 35.00    Types: Cigarettes  . Smokeless tobacco: Never Used  Substance and Sexual Activity  . Alcohol use: Yes    Comment: rare  . Drug use: No  . Sexual activity: Not on file  Other Topics Concern  . Not on file  Social History Narrative  . Not on file    Family History  Problem Relation Age of Onset  . Coronary artery disease Father        CABG x2  . Heart disease Father 27       CABG  . Stroke Unknown  Grandparent  . Breast cancer Unknown        Family history -1st degree relative <50  . Heart disease Paternal Grandfather     No Known Allergies  Medication list reviewed and updated in full in [facility].   GEN: No acute illnesses, no fevers, chills. GI: No n/v/d, eating normally Pulm: No SOB Interactive and getting along well at home.  Otherwise, ROS is as per the HPI.  Objective:   BP 112/68   Pulse (!) 108   Temp 99 F (37.2 C) (Oral)   Ht 5' 8.5" (1.74 m)   Wt 193 lb 12 oz (87.9 kg)   BMI 29.03 kg/m   GEN: WDWN, NAD, Non-toxic, A & O x 3 HEENT: Atraumatic, Normocephalic. Neck supple. No masses, No  LAD. Ears and Nose: No external deformity. CV: RRR, No M/G/R. No JVD. No thrill. No extra heart sounds. PULM: CTA B, no wheezes, crackles, rhonchi. No retractions. No resp. distress. No accessory muscle use. GU: prominent L inguinal hernia, nt EXTR: No c/c/e NEURO Normal gait.  PSYCH: Normally interactive. Flat affect  Laboratory and Imaging Data:  Assessment and Plan:   Inguinal hernia of left side without obstruction or gangrene  Depression, major, recurrent, moderate (HCC)  >25 minutes spent in face to face time with patient, >50% spent in counselling or coordination of care: reassured about hernia - follow for now. GS if desired.  MDD is not doing well, recurrence again. On Celexa 40 mg. Still depressed most of the time. Decreased energy and interest. Decreased sleep. Having a hard time getting up. Never been to psych - has d/w wife. Open to this idea. For now, I am going to have him start wellbutrin, too, with wife to help arrange psych f/u based on his insurance. He has had some occ thoughts of hurting himself, but swears he has no intention.  Follow-up: Return for f/u 3-4 weeks.  Meds ordered this encounter  Medications  . buPROPion (WELLBUTRIN XL) 150 MG 24 hr tablet    Sig: Take 1 tablet (150 mg total) by mouth daily.    Dispense:  30 tablet    Refill:  2   Signed,  Macil Crady T. Alyne Martinson, MD   Allergies as of 09/02/2017   No Known Allergies     Medication List        Accurate as of 09/02/17  1:34 PM. Always use your most recent med list.          atorvastatin 40 MG tablet Commonly known as:  LIPITOR TAKE ONE TABLET BY MOUTH EVERY DAY AT 6PM   buPROPion 150 MG 24 hr tablet Commonly known as:  WELLBUTRIN XL Take 1 tablet (150 mg total) by mouth daily.   citalopram 40 MG tablet Commonly known as:  CELEXA Take 1 tablet (40 mg total) by mouth daily.   clonazePAM 0.5 MG tablet Commonly known as:  KLONOPIN Take 1 tablet (0.5 mg total) 2 (two) times daily as  needed by mouth for anxiety.   hydrochlorothiazide 25 MG tablet Commonly known as:  HYDRODIURIL TAKE ONE TABLET BY MOUTH EVERY DAY. NEEDOFFICE VISIT!   hydrochlorothiazide 25 MG tablet Commonly known as:  HYDRODIURIL TAKE ONE TABLET BY MOUTH EVERY DAY. NEEDOFFICE VISIT!   ibuprofen 200 MG tablet Commonly known as:  ADVIL,MOTRIN Take 800 mg by mouth every 6 (six) hours as needed for headache or moderate pain.   pantoprazole 40 MG tablet Commonly known as:  PROTONIX TAKE ONE TABLET BY MOUTH  EVERY DAY   sildenafil 20 MG tablet Commonly known as:  REVATIO 2 to 5 tabs 30 mins prior to intercourse. Make sure he understands this is cash not to be run through insurance.   tamsulosin 0.4 MG Caps capsule Commonly known as:  FLOMAX TAKE 1 CAPSULE BY MOUTH EVERY DAY

## 2017-10-07 ENCOUNTER — Ambulatory Visit: Payer: Managed Care, Other (non HMO) | Admitting: Family Medicine

## 2017-10-07 ENCOUNTER — Other Ambulatory Visit: Payer: Self-pay

## 2017-10-07 ENCOUNTER — Encounter: Payer: Self-pay | Admitting: Family Medicine

## 2017-10-07 VITALS — BP 118/62 | HR 91 | Temp 98.6°F | Ht 68.5 in | Wt 192.2 lb

## 2017-10-07 DIAGNOSIS — F331 Major depressive disorder, recurrent, moderate: Secondary | ICD-10-CM

## 2017-10-07 NOTE — Patient Instructions (Signed)
Get a password for the [facility]. Send me a message in mid-May to let me know how you are doing.

## 2017-10-07 NOTE — Progress Notes (Signed)
   Dr. Frederico Hamman T. Seaver Machia, MD, [facility] Primary [facility] and Sports Medicine [place], [address] Phone: [phone] Fax: [phone]  10/07/2017  Patient: Logan Norris, MRN: [id number], DOB: 08/03/1961, 57 y.o.  Primary Physician:  Owens Loffler, MD   Chief Complaint  Patient presents with  . Depression   Subjective:   [name] is a 57 y.o. very pleasant male patient who presents with the following:  >15 minutes spent in face to face time with patient, >50% spent in counselling or coordination of care   He is doing much better, now with functionally no dep after wellbutrin 150 mg added. Rare anxiety. Sleeping 5-6 hours a night. No problems at work or home, but some stress.   Overall feels much better.   Electronically Signed  By: Owens Loffler, MD On: 10/07/2017 8:42 AM   Patient Instructions  Get a password for the [facility]. Send me a message in mid-May to let me know how you are doing.

## 2017-11-06 ENCOUNTER — Other Ambulatory Visit: Payer: Self-pay | Admitting: Family Medicine

## 2017-11-06 NOTE — Telephone Encounter (Signed)
Last office visit 10/07/2017. Last refilled 06/24/2017 for #60 with 1 refill.  Ok to refill?

## 2017-11-27 ENCOUNTER — Other Ambulatory Visit: Payer: Self-pay | Admitting: Family Medicine

## 2017-12-05 ENCOUNTER — Other Ambulatory Visit: Payer: Self-pay | Admitting: Family Medicine

## 2017-12-25 ENCOUNTER — Other Ambulatory Visit: Payer: Self-pay | Admitting: Family Medicine

## 2018-03-13 ENCOUNTER — Telehealth: Payer: Self-pay | Admitting: Family Medicine

## 2018-03-13 DIAGNOSIS — K4090 Unilateral inguinal hernia, without obstruction or gangrene, not specified as recurrent: Secondary | ICD-10-CM

## 2018-03-13 NOTE — Telephone Encounter (Signed)
done

## 2018-03-13 NOTE — Telephone Encounter (Signed)
Pt was seen 09/02/17 with lt inguinal hernia.Please advise.

## 2018-03-13 NOTE — Telephone Encounter (Signed)
Copied from [place] [id number]. Topic: Referral - Request >> Mar 13, 2018  8:41 AM Gardiner Ramus wrote: Reason for CRM: pt called and stated that he would like a referral put in for a hernia that he was diagnosed with in march. Please advise. Cb#

## 2018-04-01 ENCOUNTER — Other Ambulatory Visit: Payer: Self-pay | Admitting: Surgery

## 2018-04-02 ENCOUNTER — Other Ambulatory Visit: Payer: Self-pay | Admitting: *Deleted

## 2018-04-02 MED ORDER — CITALOPRAM HYDROBROMIDE 40 MG PO TABS: 40.0000 mg | ORAL_TABLET | Freq: Every day | ORAL | 1 refills | Status: DC

## 2018-05-01 ENCOUNTER — Telehealth: Payer: Self-pay | Admitting: Family Medicine

## 2018-05-01 NOTE — Telephone Encounter (Signed)
Please schedule CPE with fasting labs with Dr. Copland. 

## 2018-05-01 NOTE — Telephone Encounter (Signed)
Left message asking pt to call office  °

## 2018-05-02 ENCOUNTER — Telehealth: Payer: Self-pay | Admitting: Family Medicine

## 2018-05-02 NOTE — Telephone Encounter (Signed)
Left message for Logan Norris to call back and verify pharmacy.  Refills for the two medications requested was refilled 08.28.2019 for [name] but now we are getting refill request for same medications but from a [name].  OK to [facility] to find out if refills need to be sent to [name].

## 2018-05-05 NOTE — Telephone Encounter (Signed)
Left message asking pt to call office Mailed letter 

## 2018-05-06 MED ORDER — HYDROCHLOROTHIAZIDE 25 MG PO TABS: 25.0000 mg | ORAL_TABLET | Freq: Every day | ORAL | 0 refills | Status: DC

## 2018-05-06 MED ORDER — CITALOPRAM HYDROBROMIDE 40 MG PO TABS: 40.0000 mg | ORAL_TABLET | Freq: Every day | ORAL | 0 refills | Status: DC

## 2018-05-06 NOTE — Addendum Note (Signed)
Addended by: [name] on: 05/06/2018 04:26 PM   Modules accepted: Orders

## 2018-05-06 NOTE — Telephone Encounter (Signed)
[name] called and said the patient would like these 2 refills to go to them. Thanks!

## 2018-05-06 NOTE — Telephone Encounter (Signed)
Refills sent to [place].

## 2018-05-27 ENCOUNTER — Other Ambulatory Visit: Payer: Self-pay | Admitting: Family Medicine

## 2018-05-28 ENCOUNTER — Other Ambulatory Visit: Payer: Self-pay | Admitting: Family Medicine

## 2018-06-16 ENCOUNTER — Telehealth: Payer: Self-pay | Admitting: Family Medicine

## 2018-06-16 NOTE — Telephone Encounter (Signed)
Left message asking pt to call office.  Pt has appointment with dr copland 11/20.  See if pt would like to see dr Diona Browner same day.  If not and prefers to wait on dr copland r/s appointment to [facility]

## 2018-06-19 ENCOUNTER — Encounter: Payer: Self-pay | Admitting: Family Medicine

## 2018-06-25 ENCOUNTER — Encounter: Payer: Managed Care, Other (non HMO) | Admitting: Family Medicine

## 2018-06-25 ENCOUNTER — Telehealth: Payer: Self-pay | Admitting: Family Medicine

## 2018-06-25 MED ORDER — TAMSULOSIN HCL 0.4 MG PO CAPS: 0.4000 mg | ORAL_CAPSULE | Freq: Every day | ORAL | 2 refills | Status: DC

## 2018-06-25 NOTE — Telephone Encounter (Signed)
Pt is requesting a refill on flomax .4mg  30#. He contacted his pharmacy and was told to call pcp. Was scheduled for 11/20 cpe - r/s for 09/01/18. Pharmacy- [facility] [phone].

## 2018-06-25 NOTE — Telephone Encounter (Signed)
Refill sent as requested. 

## 2018-07-10 ENCOUNTER — Other Ambulatory Visit: Payer: Self-pay | Admitting: Family Medicine

## 2018-07-13 ENCOUNTER — Other Ambulatory Visit: Payer: Self-pay | Admitting: Family Medicine

## 2018-08-07 ENCOUNTER — Other Ambulatory Visit: Payer: Self-pay | Admitting: Family Medicine

## 2018-08-18 ENCOUNTER — Other Ambulatory Visit: Payer: Self-pay | Admitting: Family Medicine

## 2018-08-18 DIAGNOSIS — Z0000 Encounter for general adult medical examination without abnormal findings: Secondary | ICD-10-CM

## 2018-08-20 ENCOUNTER — Other Ambulatory Visit: Payer: Self-pay | Admitting: Family Medicine

## 2018-08-21 ENCOUNTER — Other Ambulatory Visit: Payer: Self-pay | Admitting: Family Medicine

## 2018-08-21 DIAGNOSIS — Z114 Encounter for screening for human immunodeficiency virus [HIV]: Secondary | ICD-10-CM

## 2018-08-21 DIAGNOSIS — Z0000 Encounter for general adult medical examination without abnormal findings: Secondary | ICD-10-CM

## 2018-08-21 NOTE — Progress Notes (Signed)
Opened in error

## 2018-08-22 ENCOUNTER — Other Ambulatory Visit: Payer: Self-pay | Admitting: Family Medicine

## 2018-08-27 ENCOUNTER — Other Ambulatory Visit (INDEPENDENT_AMBULATORY_CARE_PROVIDER_SITE_OTHER): Payer: Managed Care, Other (non HMO)

## 2018-08-27 DIAGNOSIS — Z0000 Encounter for general adult medical examination without abnormal findings: Secondary | ICD-10-CM

## 2018-08-27 DIAGNOSIS — Z125 Encounter for screening for malignant neoplasm of prostate: Secondary | ICD-10-CM

## 2018-08-27 LAB — HEPATIC FUNCTION PANEL
ALT: 13 U/L (ref 0–53)
AST: 15 U/L (ref 0–37)
Albumin: 4.8 g/dL (ref 3.5–5.2)
Alkaline Phosphatase: 54 U/L (ref 39–117)
Bilirubin, Direct: 0.1 mg/dL (ref 0.0–0.3)
TOTAL PROTEIN: 7.3 g/dL (ref 6.0–8.3)
Total Bilirubin: 0.7 mg/dL (ref 0.2–1.2)

## 2018-08-27 LAB — CBC WITH DIFFERENTIAL/PLATELET
BASOS ABS: 0.1 10*3/uL (ref 0.0–0.1)
Basophils Relative: 1 % (ref 0.0–3.0)
Eosinophils Absolute: 0.2 10*3/uL (ref 0.0–0.7)
Eosinophils Relative: 2.1 % (ref 0.0–5.0)
HCT: 46.2 % (ref 39.0–52.0)
Hemoglobin: 15.6 g/dL (ref 13.0–17.0)
Lymphocytes Relative: 25.1 % (ref 12.0–46.0)
Lymphs Abs: 1.9 10*3/uL (ref 0.7–4.0)
MCHC: 33.9 g/dL (ref 30.0–36.0)
MCV: 91 fl (ref 78.0–100.0)
MONOS PCT: 9.4 % (ref 3.0–12.0)
Monocytes Absolute: 0.7 10*3/uL (ref 0.1–1.0)
Neutro Abs: 4.7 10*3/uL (ref 1.4–7.7)
Neutrophils Relative %: 62.4 % (ref 43.0–77.0)
Platelets: 298 10*3/uL (ref 150.0–400.0)
RBC: 5.07 Mil/uL (ref 4.22–5.81)
RDW: 13.9 % (ref 11.5–15.5)
WBC: 7.5 10*3/uL (ref 4.0–10.5)

## 2018-08-27 LAB — LIPID PANEL
Cholesterol: 206 mg/dL — ABNORMAL HIGH (ref 0–200)
HDL: 37.5 mg/dL — ABNORMAL LOW (ref >39.00)
NonHDL: 168.63
Total CHOL/HDL Ratio: 5
Triglycerides: 277 mg/dL — ABNORMAL HIGH (ref 0.0–149.0)
VLDL: 55.4 mg/dL — ABNORMAL HIGH (ref 0.0–40.0)

## 2018-08-27 LAB — BASIC METABOLIC PANEL
BUN: 16 mg/dL (ref 6–23)
CHLORIDE: 98 meq/L (ref 96–112)
CO2: 32 meq/L (ref 19–32)
Calcium: 10.1 mg/dL (ref 8.4–10.5)
Creatinine, Ser: 1.31 mg/dL (ref 0.40–1.50)
GFR: 55.92 mL/min — ABNORMAL LOW (ref >60.00)
Glucose, Bld: 111 mg/dL — ABNORMAL HIGH (ref 70–99)
Potassium: 4.4 mEq/L (ref 3.5–5.1)
Sodium: 138 mEq/L (ref 135–145)

## 2018-08-27 LAB — HEMOGLOBIN A1C: HEMOGLOBIN A1C: 6.1 % (ref 4.6–6.5)

## 2018-08-27 LAB — PSA: PSA: 0.02 ng/mL — ABNORMAL LOW (ref 0.10–4.00)

## 2018-08-27 LAB — LDL CHOLESTEROL, DIRECT: Direct LDL: 115 mg/dL

## 2018-08-28 LAB — HIV ANTIBODY (ROUTINE TESTING W REFLEX): HIV 1&2 Ab, 4th Generation: NONREACTIVE

## 2018-08-31 NOTE — Progress Notes (Signed)
Dr. Frederico Hamman T. Krishav Mamone, MD, [facility] Primary [facility] and Sports Medicine [place], [address] Phone: [phone] Fax: [phone]  09/01/2018  Patient: Logan Norris, MRN: [id number], DOB: [birth year], 58 y.o.  Primary Physician:  Owens Loffler, MD   Chief Complaint  Patient presents with  . Annual Exam   Subjective:   Logan Norris is a 58 y.o. pleasant patient who presents with the following:  Preventative Health Maintenance Visit:  Health Maintenance Summary Reviewed and updated, unless pt declines services.  Tobacco History Reviewed. Still smoking 1 PPD Alcohol: No concerns, no excessive use Exercise Habits: Some activity, rec at least 30 mins 5 times a week STD concerns: no risk or activity to increase risk Drug Use: None Encouraged self-testicular check  Wt Readings from Last 3 Encounters:  09/01/18 209 lb 8 oz (95 kg)  10/07/17 192 lb 4 oz (87.2 kg)  09/02/17 193 lb 12 oz (87.9 kg)    Gained 17 pounds  R LE and on both sides. Some hair loss.     Health Maintenance  Topic Date Due  . COLONOSCOPY  01/18/2025  . TETANUS/TDAP  12/28/2026  . INFLUENZA VACCINE  Completed  . Hepatitis C Screening  Completed  . HIV Screening  Completed   Immunization History  Administered Date(s) Administered  . Influenza Whole 06/06/2010  . Influenza-Unspecified 05/07/2017  . Td 08/06/2005  . Tdap 12/27/2016   Patient Active Problem List   Diagnosis Date Noted  . Inguinal hernia of left side without obstruction or gangrene 09/02/2017  . Depression, major, recurrent, moderate ([place]) 05/13/2017  . GAD (generalized anxiety disorder) 05/13/2017  . Hypertriglyceridemia 05/27/2015  . Obesity (BMI 30-39.9) 08/17/2013  . Bleeding ulcer, hx of 09/30/2012  . Tobacco abuse   . Prostate cancer ([place]) 10/23/2010  . HYPERTENSION 02/09/2010  . High cholesterol 01/19/2009  . GERD 01/19/2009  . Hx of diverticulitis 01/19/2009   Past Medical  History:  Diagnosis Date  . Anxiety 09/20/2011  . Basal cell carcinoma   . Depression   . Diverticulitis of colon   . GERD (gastroesophageal reflux disease)   . H/O: gastrointestinal hemorrhage   . HLD (hyperlipidemia)   . HTN (hypertension)   . Hx of diverticulitis 01/19/2009   Centricity Description: DIVERTICULITIS, HX OF Qualifier: Diagnosis of  By: Lorelei Pont MD, Usman Millett   Centricity Description: GASTROINTESTINAL HEMORRHAGE, HX OF Qualifier: Diagnosis of  By: Lorelei Pont MD, Frederico Hamman    . Hypertriglyceridemia 05/27/2015  . Prostate cancer ([place]) 10/23/2010   Qualifier: Diagnosis of  By: Lorelei Pont MD, Frederico Hamman    . Sinus tachycardia 04/13   Holter: NSR, rare PAcs and PVCs  . Tobacco abuse    Past Surgical History:  Procedure Laterality Date  . INSERTION PROSTATE RADIATION SEED     Social History   Socioeconomic History  . Marital status: Married    Spouse name: Not on file  . Number of children: Not on file  . Years of education: Not on file  . Highest education level: Not on file  Occupational History  . Occupation: [place], [place]: [employer]  Social Needs  . Financial resource strain: Not on file  . Food insecurity:    Worry: Not on file    Inability: Not on file  . Transportation needs:    Medical: Not on file    Non-medical: Not on file  Tobacco Use  . Smoking status: Current Every Day Smoker  Packs/day: 1.00    Years: 35.00    Pack years: 35.00    Types: Cigarettes  . Smokeless tobacco: Never Used  Substance and Sexual Activity  . Alcohol use: Yes    Comment: rare  . Drug use: No  . Sexual activity: Not on file  Lifestyle  . Physical activity:    Days per week: Not on file    Minutes per session: Not on file  . Stress: Not on file  Relationships  . Social connections:    Talks on phone: Not on file    Gets together: Not on file    Attends religious service: Not on file    Active member of club or organization:  Not on file    Attends meetings of clubs or organizations: Not on file    Relationship status: Not on file  . Intimate partner violence:    Fear of current or ex partner: Not on file    Emotionally abused: Not on file    Physically abused: Not on file    Forced sexual activity: Not on file  Other Topics Concern  . Not on file  Social History Narrative  . Not on file   Family History  Problem Relation Age of Onset  . Coronary artery disease Father        CABG x2  . Heart disease Father 52       CABG  . Stroke Unknown        Grandparent  . Breast cancer Unknown        Family history -1st degree relative <50  . Heart disease Paternal Grandfather    No Known Allergies  Medication list has been reviewed and updated.   General: Denies fever, chills, sweats. No significant weight loss. Eyes: Denies blurring,significant itching ENT: Denies earache, sore throat, and hoarseness. Cardiovascular: Denies chest pains, palpitations, dyspnea on exertion Respiratory: Denies cough, dyspnea at rest,wheeezing Breast: no concerns about lumps GI: Denies nausea, vomiting, diarrhea, constipation, change in bowel habits, abdominal pain, melena, hematochezia GU: Denies penile discharge, ED, urinary flow / outflow problems. No STD concerns. Musculoskeletal: Denies back pain, joint pain Derm: multiple skin lesions Neuro: Denies  paresthesias, frequent falls, frequent headaches Psych: Denies depression, anxiety Endocrine: Denies cold intolerance, heat intolerance, polydipsia Heme: Denies enlarged lymph nodes Allergy: No hayfever  Objective:   BP 130/70   Pulse (!) 112   Temp 97.9 F (36.6 C) (Oral)   Ht 5' 8.5" (1.74 m)   Wt 209 lb 8 oz (95 kg)   BMI 31.39 kg/m  Ideal Body Weight: Weight in (lb) to have BMI = 25: 166.5  No exam data present  GEN: well developed, well nourished, no acute distress Eyes: conjunctiva and lids normal, PERRLA, EOMI ENT: TM clear, nares clear, oral exam  WNL Neck: supple, no lymphadenopathy, no thyromegaly, no JVD Pulm: clear to auscultation and percussion, respiratory effort normal CV: regular rate and rhythm, S1-S2, no murmur, rub or gallop, no bruits, peripheral pulses normal and symmetric, no cyanosis, clubbing, edema or varicosities GI: soft, non-tender; no hepatosplenomegaly, masses; active bowel sounds all quadrants GU: no hernia, testicular mass, penile discharge Lymph: no cervical, axillary or inguinal adenopathy MSK: gait normal, muscle tone and strength WNL, no joint swelling, effusions, discoloration, crepitus  SKIN: MULTIPLE SEB K'S, L ANTERIOR CHECK WALL WITH PEARLY ELEVATED LESION WITH SOME ULCERATION Neuro: normal mental status, normal strength, sensation, and motion Psych: alert; oriented to person, place and time, normally interactive and not anxious  or depressed in appearance.  All labs reviewed with patient.  Lipids: Lab Results  Component Value Date   CHOL 206 (H) 08/27/2018   Lab Results  Component Value Date   HDL 37.50 (L) 08/27/2018   Lab Results  Component Value Date   LDLCALC 88 02/20/2016   Lab Results  Component Value Date   TRIG 277.0 (H) 08/27/2018   Lab Results  Component Value Date   CHOLHDL 5 08/27/2018   CBC: CBC Latest Ref Rng & Units 08/27/2018 02/01/2017 02/20/2016  WBC 4.0 - 10.5 K/uL 7.5 8.6 5.2  Hemoglobin 13.0 - 17.0 g/dL 15.6 15.0 14.0  Hematocrit 39.0 - 52.0 % 46.2 43.8 41.6  Platelets 150.0 - 400.0 K/uL 298.0 308.0 287.6    Basic Metabolic Panel:    Component Value Date/Time   NA 138 08/27/2018 0801   K 4.4 08/27/2018 0801   CL 98 08/27/2018 0801   CO2 32 08/27/2018 0801   BUN 16 08/27/2018 0801   CREATININE 1.31 08/27/2018 0801   GLUCOSE 111 (H) 08/27/2018 0801   CALCIUM 10.1 08/27/2018 0801   Hepatic Function Latest Ref Rng & Units 08/27/2018 02/20/2016 11/10/2014  Total Protein 6.0 - 8.3 g/dL 7.3 7.1 7.1  Albumin 3.5 - 5.2 g/dL 4.8 4.5 4.4  AST 0 - 37 U/L '15 14 16   ' ALT 0 - 53 U/L '13 11 14  ' Alk Phosphatase 39 - 117 U/L 54 57 59  Total Bilirubin 0.2 - 1.2 mg/dL 0.7 0.5 0.4  Bilirubin, Direct 0.0 - 0.3 mg/dL 0.1 - 0.1    Lab Results  Component Value Date   HGBA1C 6.1 08/27/2018   Lab Results  Component Value Date   TSH 2.44 02/20/2016   Lab Results  Component Value Date   PSA 0.02 (L) 08/27/2018   PSA 0.02 (L) 02/20/2016   PSA 0.08 (L) 11/10/2014    Assessment and Plan:   Routine general medical examination at a health care facility  Work on weight and smoking.  Concern for [facility] - he is going to f/u with his dermatologist.  Health Maintenance Exam: The patient's preventative maintenance and recommended screening tests for an annual wellness exam were reviewed in full today. Brought up to date unless services declined.  Counselled on the importance of diet, exercise, and its role in overall health and mortality. The patient's FH and SH was reviewed, including their home life, tobacco status, and drug and alcohol status.  Follow-up in 1 year for physical exam or additional follow-up below.  Follow-up: No follow-ups on file. Or follow-up in 1 year if not noted.  Signed,  Maud Deed. Joyous Gleghorn, MD   Allergies as of 09/01/2018   No Known Allergies     Medication List       Accurate as of September 01, 2018  8:34 AM. Always use your most recent med list.        atorvastatin 40 MG tablet Commonly known as:  LIPITOR Take 1 tablet by mouth daily at 6pm   buPROPion 150 MG 24 hr tablet Commonly known as:  WELLBUTRIN XL TAKE ONE TABLET BY MOUTH EVERY DAY   citalopram 40 MG tablet Commonly known as:  CELEXA Take 1 tablet by mouth daily.   clonazePAM 0.5 MG tablet Commonly known as:  KLONOPIN TAKE ONE TABLET BY MOUTH TWICE DAILY AS NEEDED   hydrochlorothiazide 25 MG tablet Commonly known as:  HYDRODIURIL Take 1 tablet  by mouth daily.   ibuprofen 200 MG tablet Commonly known as:  ADVIL,MOTRIN Take 800 mg by mouth every 6  (six) hours as needed for headache or moderate pain.   pantoprazole 40 MG tablet Commonly known as:  PROTONIX Take 1 tablet by mouth daily   sildenafil 20 MG tablet Commonly known as:  REVATIO 2 to 5 tabs 30 mins prior to intercourse. Make sure he understands this is cash not to be run through insurance.   tamsulosin 0.4 MG Caps capsule Commonly known as:  FLOMAX Take 1 capsule (0.4 mg total) by mouth daily.

## 2018-09-01 ENCOUNTER — Encounter: Payer: Self-pay | Admitting: Family Medicine

## 2018-09-01 ENCOUNTER — Ambulatory Visit (INDEPENDENT_AMBULATORY_CARE_PROVIDER_SITE_OTHER): Payer: Managed Care, Other (non HMO) | Admitting: Family Medicine

## 2018-09-01 VITALS — BP 130/70 | HR 112 | Temp 97.9°F | Ht 68.5 in | Wt 209.5 lb

## 2018-09-01 DIAGNOSIS — Z0000 Encounter for general adult medical examination without abnormal findings: Secondary | ICD-10-CM | POA: Diagnosis not present

## 2018-09-03 ENCOUNTER — Other Ambulatory Visit: Payer: Self-pay | Admitting: Family Medicine

## 2018-09-15 ENCOUNTER — Other Ambulatory Visit: Payer: Self-pay | Admitting: Surgery

## 2018-09-16 ENCOUNTER — Other Ambulatory Visit: Payer: Self-pay | Admitting: Family Medicine

## 2018-09-19 ENCOUNTER — Other Ambulatory Visit: Payer: Self-pay | Admitting: Family Medicine

## 2018-09-27 ENCOUNTER — Other Ambulatory Visit: Payer: Self-pay | Admitting: Family Medicine

## 2018-10-06 ENCOUNTER — Other Ambulatory Visit: Payer: Self-pay | Admitting: Family Medicine

## 2018-10-07 ENCOUNTER — Telehealth (HOSPITAL_COMMUNITY): Payer: Self-pay | Admitting: Licensed Clinical Social Worker

## 2018-10-07 NOTE — Telephone Encounter (Signed)
Called to inquire about PT beginning CD-IOP. Left VM asking for return call.

## 2018-11-03 ENCOUNTER — Other Ambulatory Visit: Payer: Self-pay | Admitting: Family Medicine

## 2018-11-05 ENCOUNTER — Other Ambulatory Visit: Payer: Self-pay | Admitting: Family Medicine

## 2018-11-26 ENCOUNTER — Other Ambulatory Visit: Payer: Self-pay | Admitting: Family Medicine

## 2018-11-27 ENCOUNTER — Ambulatory Visit (INDEPENDENT_AMBULATORY_CARE_PROVIDER_SITE_OTHER): Payer: Managed Care, Other (non HMO)

## 2018-11-27 ENCOUNTER — Other Ambulatory Visit: Payer: Self-pay

## 2018-11-27 ENCOUNTER — Encounter: Payer: Self-pay | Admitting: Podiatry

## 2018-11-27 ENCOUNTER — Ambulatory Visit: Payer: Managed Care, Other (non HMO) | Admitting: Podiatry

## 2018-11-27 VITALS — Temp 97.4°F

## 2018-11-27 DIAGNOSIS — M722 Plantar fascial fibromatosis: Secondary | ICD-10-CM

## 2018-11-27 MED ORDER — METHYLPREDNISOLONE 4 MG PO TBPK: ORAL_TABLET | ORAL | 0 refills | Status: DC

## 2018-11-27 MED ORDER — MELOXICAM 15 MG PO TABS: 15.0000 mg | ORAL_TABLET | Freq: Every day | ORAL | 3 refills | Status: DC

## 2018-11-27 NOTE — Progress Notes (Signed)
Subjective:  Patient ID: Logan Norris, male    DOB: 07/30/1961,  MRN: [id number] HPI Chief Complaint  Patient presents with  . Foot Pain    Plantar heel right - aching x 6-8 months, AM pain, noticing knot in arch, no treatment  . New Patient (Initial Visit)    58 y.o. male presents with the above complaint.   ROS: Denies fever chills nausea vomiting muscle aches pains calf pain back pain chest pain shortness of breath.  Past Medical History:  Diagnosis Date  . Anxiety 09/20/2011  . Basal cell carcinoma   . Depression   . Diverticulitis of colon   . GERD (gastroesophageal reflux disease)   . H/O: gastrointestinal hemorrhage   . HLD (hyperlipidemia)   . HTN (hypertension)   . Hx of diverticulitis 01/19/2009   Centricity Description: DIVERTICULITIS, HX OF Qualifier: Diagnosis of  By: Lorelei Pont MD, [place]   Centricity Description: GASTROINTESTINAL HEMORRHAGE, HX OF Qualifier: Diagnosis of  By: Lorelei Pont MD, Frederico Hamman    . Hypertriglyceridemia 05/27/2015  . Prostate cancer ([place]) 10/23/2010   Qualifier: Diagnosis of  By: Lorelei Pont MD, Frederico Hamman    . Sinus tachycardia 04/13   Holter: NSR, rare PAcs and PVCs  . Tobacco abuse    Past Surgical History:  Procedure Laterality Date  . INSERTION PROSTATE RADIATION SEED      Current Outpatient Medications:  .  atorvastatin (LIPITOR) 40 MG tablet, Take 1 tablet by mouth daily at 6pm, Disp: 30 tablet, Rfl: 10 .  buPROPion (WELLBUTRIN XL) 150 MG 24 hr tablet, TAKE ONE TABLET BY MOUTH DAILY, Disp: 90 tablet, Rfl: 1 .  citalopram (CELEXA) 40 MG tablet, Take 1 tablet by mouth daily., Disp: 90 tablet, Rfl: 1 .  clonazePAM (KLONOPIN) 0.5 MG tablet, TAKE ONE TABLET BY MOUTH TWICE DAILY AS NEEDED, Disp: 60 tablet, Rfl: 1 .  hydrochlorothiazide (HYDRODIURIL) 25 MG tablet, Take 1 tablet by mouth daily, Disp: 90 tablet, Rfl: 1 .  ibuprofen (ADVIL,MOTRIN) 200 MG tablet, Take 800 mg by mouth every 6 (six) hours as needed for headache or moderate pain.,  Disp: , Rfl:  .  meloxicam (MOBIC) 15 MG tablet, Take 1 tablet (15 mg total) by mouth daily., Disp: 30 tablet, Rfl: 3 .  methylPREDNISolone (MEDROL DOSEPAK) 4 MG TBPK tablet, 6 day dose pack - take as directed, Disp: 21 tablet, Rfl: 0 .  pantoprazole (PROTONIX) 40 MG tablet, Take 1 tablet by mouth daily, Disp: 90 tablet, Rfl: 3 .  sildenafil (REVATIO) 20 MG tablet, 2 to 5 tabs 30 mins prior to intercourse. Make sure he understands this is cash not to be run through insurance., Disp: 50 tablet, Rfl: 11 .  tamsulosin (FLOMAX) 0.4 MG CAPS capsule, Take one capsule by mouth daily, Disp: 90 capsule, Rfl: 0  No Known Allergies Review of Systems Objective:   Vitals:   11/27/18 0835  Temp: (!) 97.4 F (36.3 C)    General: Well developed, nourished, in no acute distress, alert and oriented x3   Dermatological: Skin is warm, dry and supple bilateral. Nails x 10 are well maintained; remaining integument appears unremarkable at this time. There are no open sores, no preulcerative lesions, no rash or signs of infection present.  Vascular: Dorsalis Pedis artery and Posterior Tibial artery pedal pulses are 2/4 bilateral with immedate capillary fill time. Pedal hair growth present. No varicosities and no lower extremity edema present bilateral.   Neruologic: Grossly intact via light touch bilateral. Vibratory intact via tuning fork bilateral. Protective threshold  with Semmes Wienstein monofilament intact to all pedal sites bilateral. Patellar and Achilles deep tendon reflexes 2+ bilateral. No Babinski or clonus noted bilateral.   Musculoskeletal: No gross boney pedal deformities bilateral. No pain, crepitus, or limitation noted with foot and ankle range of motion bilateral. Muscular strength 5/5 in all groups tested bilateral.  Nonpulsatile nodule medial band of the plantar fascia distal to the arch measures about a centimeter or little greater in diameter does not appear to be connected to the skin and is  nontender on palpation.  He does have pain on palpation same foot medial border of the plantar fascia at the calcaneal insertion site.  Gait: Unassisted, Nonantalgic.    Radiographs:  Soft tissue increase in density plantar fashion calcaneal insertion site and no calcification within the fascia.  No acute findings.  Assessment & Plan:   Assessment: Plantar fasciitis right foot plantar fibroma right foot  Plan: Discussed etiology pathology conservative versus surgical therapies.  I looked at his shoes today and expressed to him my concerns about the possibility and the wear of the shoe.  He is going get a new pair shoes.  I also injected the right heel today after sterile Betadine skin prep with 20 mg of Kenalog 5 mg Marcaine to the point of maximal tenderness of the right heel.  He tolerated the procedure well.  I will start him on methylprednisolone to be followed by meloxicam.  Discussed appropriate shoe gear stretching exercise ice therapy and shoe gear modifications.  We placed him in a plantar fascial brace followed by a night splint and I will follow-up with him in 1 month.  Should he have questions or concerns regarding this treatment plan he will notify us immediately.     Renea Schoonmaker T. [place], [place]

## 2018-11-27 NOTE — Patient Instructions (Signed)

## 2018-12-26 ENCOUNTER — Other Ambulatory Visit: Payer: Self-pay | Admitting: Family Medicine

## 2018-12-31 ENCOUNTER — Ambulatory Visit: Payer: Managed Care, Other (non HMO) | Admitting: Podiatry

## 2019-01-19 ENCOUNTER — Telehealth: Payer: Self-pay | Admitting: General Practice

## 2019-01-19 ENCOUNTER — Encounter (INDEPENDENT_AMBULATORY_CARE_PROVIDER_SITE_OTHER): Payer: Self-pay

## 2019-01-19 ENCOUNTER — Other Ambulatory Visit: Payer: Managed Care, Other (non HMO)

## 2019-01-19 ENCOUNTER — Ambulatory Visit (INDEPENDENT_AMBULATORY_CARE_PROVIDER_SITE_OTHER): Payer: Managed Care, Other (non HMO) | Admitting: Family Medicine

## 2019-01-19 ENCOUNTER — Encounter: Payer: Self-pay | Admitting: Family Medicine

## 2019-01-19 DIAGNOSIS — Z20822 Contact with and (suspected) exposure to covid-19: Secondary | ICD-10-CM

## 2019-01-19 DIAGNOSIS — Z20828 Contact with and (suspected) exposure to other viral communicable diseases: Secondary | ICD-10-CM | POA: Diagnosis not present

## 2019-01-19 NOTE — Progress Notes (Signed)
     Logan Gilliam T. Aurelius Gildersleeve, MD Primary Care and [place] at [facility] [place], [address] Phone: [phone]  FAX: [phone]  Logan Norris - 58 y.o. male  MRN [id number]  Date of Birth: [birth year]  Visit Date: 01/19/2019  PCP: Owens Loffler, MD  Referred by: Owens Loffler, MD Chief Complaint  Patient presents with  . Covid Testing   Virtual Visit via Video Note:  I connected with  Logan Norris on 01/19/2019  8:40 AM EDT by a video enabled telemedicine application and verified that I am speaking with the correct person using two identifiers.   Location patient: home computer, tablet, or smartphone Location provider: work or home office Consent: Verbal consent directly obtained from [name]. Persons participating in the virtual visit: patient, provider  I discussed the limitations of evaluation and management by telemedicine and the availability of in person appointments. The patient expressed understanding and agreed to proceed.  History of Present Illness:  We have been trying to connect with this patient with great difficulty and multiple phone calls. 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  He is completely asymptomatic, he does not have any cough, shortness of breath, he does not feel bad, and he is afebrile.  No GI symptoms and no neurological symptoms.  He is traveling at the end of the week to see his grandchildren.  Covid-19 orders done  Review of Systems as above: See pertinent positives and pertinent negatives per HPI No acute distress verbally  Past Medical History, Surgical History, Social History, Family History, Problem List, Medications, and Allergies have been reviewed and updated if relevant.   Observations/Objective/Exam:  An attempt was made to discern vital signs over the phone and per  patient if applicable and possible.   General:    Alert, Oriented, appears well and in no acute distress HEENT:     Atraumatic, conjunctiva clear, no obvious abnormalities on inspection of external nose and ears.  Neck:    Normal movements of the head and neck Pulmonary:     On inspection no signs of respiratory distress, breathing rate appears normal, no obvious gross SOB, gasping or wheezing Cardiovascular:    No obvious cyanosis Musculoskeletal:    Moves all visible extremities without noticeable abnormality Psych / Neurological:     Pleasant and cooperative, no obvious depression or anxiety, speech and thought processing grossly intact  Assessment and Plan:  Close Exposure to Covid-19 Virus - Plan: [employer], given protocols, known exposure x3, confirmed cases, initiate COVID-19 testing and following.     I discussed the assessment and treatment plan with the patient. The patient was provided an opportunity to ask questions and all were answered. The patient agreed with the plan and demonstrated an understanding of the instructions.   The patient was advised to call back or seek an in-person evaluation if the symptoms worsen or if the condition fails to improve as anticipated.  Follow-up: prn unless noted otherwise below No follow-ups on file.  No orders of the defined types were placed in this encounter.  No orders of the defined types were placed in this encounter.   Signed,  Maud Deed. Crystalle Popwell, MD

## 2019-01-19 NOTE — Telephone Encounter (Signed)
Called pt, lvm to return call to scheduled covid testing.

## 2019-01-19 NOTE — Telephone Encounter (Signed)
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

## 2019-01-19 NOTE — Telephone Encounter (Signed)
Pt called and states that he already had COVID testing on today.

## 2019-01-19 NOTE — Addendum Note (Signed)
Addended by: Dimple Nanas on: 01/19/2019 05:06 PM   Modules accepted: Orders

## 2019-01-20 LAB — NOVEL CORONAVIRUS, NAA: SARS-CoV-2, NAA: NOT DETECTED

## 2019-01-21 ENCOUNTER — Encounter (INDEPENDENT_AMBULATORY_CARE_PROVIDER_SITE_OTHER): Payer: Self-pay

## 2019-01-22 ENCOUNTER — Encounter (INDEPENDENT_AMBULATORY_CARE_PROVIDER_SITE_OTHER): Payer: Self-pay

## 2019-01-22 ENCOUNTER — Telehealth: Payer: Self-pay | Admitting: Family Medicine

## 2019-01-22 NOTE — Telephone Encounter (Signed)
Left message asking pt to call office regarding my chart message  Yes    [name]  ----- Message -----  From: Darl Householder  Sent: 01/22/2019  2:37 PM EDT  To: [name], CMA  Subject: FW: Appointment Request               Does pt need virtual appointment?  ----- Message -----  From: Logan Norris  Sent: 01/21/2019  7:25 AM EDT  To: [place] Support Pool  Subject: Appointment Request                 Appointment Request From: Logan Norris    With Provider: Owens Loffler, MD [facility] [place]    Preferred Date Range: 01/26/2019 - 01/26/2019    Preferred Times: Any Time    Reason for visit: Office Visit    Comments:  I was tested on 6/15 with negative results for Covid19. I'm traveling to [place] 6/26 to visit daughter and grand children. Would like to get re-tested, please advise.

## 2019-01-24 ENCOUNTER — Encounter (INDEPENDENT_AMBULATORY_CARE_PROVIDER_SITE_OTHER): Payer: Self-pay

## 2019-01-26 ENCOUNTER — Encounter (INDEPENDENT_AMBULATORY_CARE_PROVIDER_SITE_OTHER): Payer: Self-pay

## 2019-02-06 ENCOUNTER — Other Ambulatory Visit: Payer: Self-pay | Admitting: Family Medicine

## 2019-05-05 ENCOUNTER — Other Ambulatory Visit: Payer: Self-pay | Admitting: Family Medicine

## 2019-05-08 ENCOUNTER — Ambulatory Visit: Payer: Managed Care, Other (non HMO)

## 2019-05-08 ENCOUNTER — Other Ambulatory Visit: Payer: Self-pay

## 2019-05-14 ENCOUNTER — Ambulatory Visit: Payer: Managed Care, Other (non HMO)

## 2019-05-28 ENCOUNTER — Telehealth: Payer: Self-pay | Admitting: Family Medicine

## 2019-05-28 MED ORDER — VARENICLINE TARTRATE 1 MG PO TABS: 1.0000 mg | ORAL_TABLET | Freq: Two times a day (BID) | ORAL | 3 refills | Status: DC

## 2019-05-28 MED ORDER — CHANTIX STARTING MONTH PAK 0.5 MG X 11 & 1 MG X 42 PO TABS: ORAL_TABLET | ORAL | 0 refills | Status: DC

## 2019-05-28 NOTE — Telephone Encounter (Signed)
Patient called in today asking if he could be prescribed chantix to help him quit smoking. Patient asked if this could be done with out him coming in for an appointment .,   Please advise

## 2019-05-28 NOTE — Telephone Encounter (Signed)
Left message for Logan Norris that Dr. Lorelei Pont has sent in prescription that he requested to [facility].

## 2019-05-28 NOTE — Telephone Encounter (Signed)
No problem, can you let him know?

## 2019-06-02 ENCOUNTER — Ambulatory Visit (INDEPENDENT_AMBULATORY_CARE_PROVIDER_SITE_OTHER): Payer: Managed Care, Other (non HMO)

## 2019-06-02 DIAGNOSIS — Z23 Encounter for immunization: Secondary | ICD-10-CM

## 2019-07-01 ENCOUNTER — Other Ambulatory Visit: Payer: Self-pay | Admitting: Family Medicine

## 2019-07-15 ENCOUNTER — Other Ambulatory Visit: Payer: Self-pay | Admitting: Family Medicine

## 2019-07-29 ENCOUNTER — Telehealth: Payer: Self-pay | Admitting: Family Medicine

## 2019-07-29 NOTE — Telephone Encounter (Signed)
Left message asking pt to call office  [name], CMA  Ramond Craver B  Will you see if you can go ahead and get Mr. Logan Norris scheduled for his CPE with fasting labs for Dr. Lorelei Pont end of January.   Thanks,  [name]

## 2019-08-05 NOTE — Telephone Encounter (Signed)
Left message asking pt to call office  °

## 2019-08-10 ENCOUNTER — Other Ambulatory Visit: Payer: Self-pay | Admitting: Family Medicine

## 2019-08-14 ENCOUNTER — Encounter: Payer: Self-pay | Admitting: Family Medicine

## 2019-08-14 NOTE — Telephone Encounter (Signed)
Left message asking pt to call office Mailed letter 

## 2019-09-27 ENCOUNTER — Telehealth: Payer: Self-pay | Admitting: Family Medicine

## 2019-09-28 NOTE — Telephone Encounter (Signed)
Please schedule CPE with fasting labs prior with Dr. Copland.  

## 2019-09-30 NOTE — Telephone Encounter (Signed)
Left message asking pt to call office  °

## 2019-10-05 ENCOUNTER — Encounter: Payer: Self-pay | Admitting: Family Medicine

## 2019-10-05 NOTE — Telephone Encounter (Signed)
Left message asking pt to call office Mailed letter 

## 2019-10-07 ENCOUNTER — Telehealth: Payer: Self-pay | Admitting: Family Medicine

## 2019-10-07 MED ORDER — ROSUVASTATIN CALCIUM 10 MG PO TABS: 10.0000 mg | ORAL_TABLET | Freq: Every day | ORAL | 3 refills | Status: DC

## 2019-10-07 NOTE — Telephone Encounter (Signed)
Noted.  Rx sent to [facility].

## 2019-10-07 NOTE — Telephone Encounter (Signed)
Left message for [name] that we are going to change Logan Norris to Crestor 10 mg daily.  I ask that she call me back with which pharmacy they would like this Rx sent to.

## 2019-10-07 NOTE — Telephone Encounter (Signed)
Logan Norris returned Logan Norris's call. She said to send rx to [facility]. She requested the generic for Crestor be called in to pharmacy.

## 2019-10-07 NOTE — Telephone Encounter (Signed)
Pt has cpx schedule 3/15 and vicki (spouse) wanted to see if pt couuld get a different statin (chol med) she states he is having bad body aches and joint pain this has been going on for a while

## 2019-10-07 NOTE — Telephone Encounter (Signed)
D/c lipitor  Crestor 10 mg, 1 po daily, #90, 1 ref

## 2019-10-09 ENCOUNTER — Other Ambulatory Visit: Payer: Self-pay | Admitting: Family Medicine

## 2019-10-09 DIAGNOSIS — C61 Malignant neoplasm of prostate: Secondary | ICD-10-CM

## 2019-10-09 DIAGNOSIS — Z0000 Encounter for general adult medical examination without abnormal findings: Secondary | ICD-10-CM

## 2019-10-12 ENCOUNTER — Other Ambulatory Visit: Payer: Self-pay

## 2019-10-12 ENCOUNTER — Other Ambulatory Visit (INDEPENDENT_AMBULATORY_CARE_PROVIDER_SITE_OTHER): Payer: Managed Care, Other (non HMO)

## 2019-10-12 DIAGNOSIS — C61 Malignant neoplasm of prostate: Secondary | ICD-10-CM

## 2019-10-12 DIAGNOSIS — Z0000 Encounter for general adult medical examination without abnormal findings: Secondary | ICD-10-CM

## 2019-10-12 LAB — BASIC METABOLIC PANEL
BUN: 12 mg/dL (ref 6–23)
CO2: 32 mEq/L (ref 19–32)
Calcium: 9.5 mg/dL (ref 8.4–10.5)
Chloride: 98 mEq/L (ref 96–112)
Creatinine, Ser: 1.39 mg/dL (ref 0.40–1.50)
GFR: 52.03 mL/min — ABNORMAL LOW (ref >60.00)
Glucose, Bld: 114 mg/dL — ABNORMAL HIGH (ref 70–99)
Potassium: 4.1 mEq/L (ref 3.5–5.1)
Sodium: 137 mEq/L (ref 135–145)

## 2019-10-12 LAB — LIPID PANEL
Cholesterol: 153 mg/dL (ref 0–200)
HDL: 41.6 mg/dL (ref >39.00)
NonHDL: 111.48
Total CHOL/HDL Ratio: 4
Triglycerides: 256 mg/dL — ABNORMAL HIGH (ref 0.0–149.0)
VLDL: 51.2 mg/dL — ABNORMAL HIGH (ref 0.0–40.0)

## 2019-10-12 LAB — CBC WITH DIFFERENTIAL/PLATELET
Basophils Absolute: 0.1 10*3/uL (ref 0.0–0.1)
Basophils Relative: 1 % (ref 0.0–3.0)
Eosinophils Absolute: 0.2 10*3/uL (ref 0.0–0.7)
Eosinophils Relative: 2.8 % (ref 0.0–5.0)
HCT: 42.8 % (ref 39.0–52.0)
Hemoglobin: 14.5 g/dL (ref 13.0–17.0)
Lymphocytes Relative: 21.8 % (ref 12.0–46.0)
Lymphs Abs: 1.8 10*3/uL (ref 0.7–4.0)
MCHC: 33.9 g/dL (ref 30.0–36.0)
MCV: 89.8 fl (ref 78.0–100.0)
Monocytes Absolute: 0.7 10*3/uL (ref 0.1–1.0)
Monocytes Relative: 7.9 % (ref 3.0–12.0)
Neutro Abs: 5.6 10*3/uL (ref 1.4–7.7)
Neutrophils Relative %: 66.5 % (ref 43.0–77.0)
Platelets: 288 10*3/uL (ref 150.0–400.0)
RBC: 4.77 Mil/uL (ref 4.22–5.81)
RDW: 13.5 % (ref 11.5–15.5)
WBC: 8.4 10*3/uL (ref 4.0–10.5)

## 2019-10-12 LAB — HEPATIC FUNCTION PANEL
ALT: 19 U/L (ref 0–53)
AST: 18 U/L (ref 0–37)
Albumin: 4.5 g/dL (ref 3.5–5.2)
Alkaline Phosphatase: 55 U/L (ref 39–117)
Bilirubin, Direct: 0.1 mg/dL (ref 0.0–0.3)
Total Bilirubin: 0.7 mg/dL (ref 0.2–1.2)
Total Protein: 7.3 g/dL (ref 6.0–8.3)

## 2019-10-12 LAB — LDL CHOLESTEROL, DIRECT: Direct LDL: 77 mg/dL

## 2019-10-12 LAB — HEMOGLOBIN A1C: Hgb A1c MFr Bld: 5.9 % (ref 4.6–6.5)

## 2019-10-13 LAB — PSA, TOTAL WITH REFLEX TO PSA, FREE: PSA, Total: <0.1 ng/mL (ref <=4.0)

## 2019-10-19 ENCOUNTER — Other Ambulatory Visit: Payer: Self-pay

## 2019-10-19 ENCOUNTER — Encounter: Payer: Self-pay | Admitting: Family Medicine

## 2019-10-19 ENCOUNTER — Ambulatory Visit (INDEPENDENT_AMBULATORY_CARE_PROVIDER_SITE_OTHER): Payer: Managed Care, Other (non HMO) | Admitting: Family Medicine

## 2019-10-19 VITALS — BP 126/68 | HR 77 | Temp 98.5°F | Ht 68.5 in | Wt 207.2 lb

## 2019-10-19 DIAGNOSIS — Z0000 Encounter for general adult medical examination without abnormal findings: Secondary | ICD-10-CM

## 2019-10-19 MED ORDER — TADALAFIL 5 MG PO TABS: 5.0000 mg | ORAL_TABLET | Freq: Every day | ORAL | 1 refills | Status: DC

## 2019-10-19 NOTE — Progress Notes (Signed)
Logan Schoen T. Avon Mergenthaler, MD Primary Care and [place] at [facility] [place], [address] Phone: [phone]  FAX: [phone]  Logan Norris - 59 y.o. male  MRN [id number]  Date of Birth: 09/19/1960  Visit Date: 10/19/2019  PCP: Owens Loffler, MD  Referred by: Owens Loffler, MD  Chief Complaint  Patient presents with  . Annual Exam    This visit occurred during the SARS-CoV-2 public health emergency.  Safety protocols were in place, including screening questions prior to the visit, additional usage of staff PPE, and extensive cleaning of exam room while observing appropriate contact time as indicated for disinfecting solutions.   Patient Care Team: Owens Loffler, MD as PCP - General Subjective:   Logan Norris is a 59 y.o. pleasant patient who presents with the following:  Preventative Health Maintenance Visit:  Health Maintenance Summary Reviewed and updated, unless pt declines services.  Tobacco History Reviewed. - none since 06/2019 Alcohol: No concerns, no excessive use Exercise Habits: Some activity, rec at least 30 mins 5 times a week - golf, camping and hiking STD concerns: no risk or activity to increase risk Drug Use: None  Covid-19  Health Maintenance  Topic Date Due  . COLONOSCOPY  01/18/2025  . TETANUS/TDAP  12/28/2026  . INFLUENZA VACCINE  Completed  . Hepatitis C Screening  Completed  . HIV Screening  Completed   Immunization History  Administered Date(s) Administered  . Influenza Whole 06/06/2010  . Influenza, Quadrivalent, Recombinant, Inj, Pf 05/18/2018  . Influenza,inj,Quad PF,6+ Mos 06/02/2019  . Influenza-Unspecified 05/07/2017  . Td 08/06/2005  . Tdap 12/27/2016   Patient Active Problem List   Diagnosis Date Noted  . Inguinal hernia of left side without obstruction or gangrene 09/02/2017  . Depression, major, recurrent, moderate ([place]) 05/13/2017  . GAD  (generalized anxiety disorder) 05/13/2017  . Hypertriglyceridemia 05/27/2015  . Obesity (BMI 30-39.9) 08/17/2013  . Bleeding ulcer, hx of 09/30/2012  . Tobacco abuse   . Prostate cancer ([place]) 10/23/2010  . HYPERTENSION 02/09/2010  . High cholesterol 01/19/2009  . GERD 01/19/2009  . Hx of diverticulitis 01/19/2009    Past Medical History:  Diagnosis Date  . Anxiety 09/20/2011  . Basal cell carcinoma   . Depression   . Diverticulitis of colon   . GERD (gastroesophageal reflux disease)   . H/O: gastrointestinal hemorrhage   . HLD (hyperlipidemia)   . HTN (hypertension)   . Hx of diverticulitis 01/19/2009   Centricity Description: DIVERTICULITIS, HX OF Qualifier: Diagnosis of  By: Lorelei Pont MD, Krystalle Pilkington   Centricity Description: GASTROINTESTINAL HEMORRHAGE, HX OF Qualifier: Diagnosis of  By: Lorelei Pont MD, Frederico Hamman    . Hypertriglyceridemia 05/27/2015  . Prostate cancer ([place]) 10/23/2010   Qualifier: Diagnosis of  By: Lorelei Pont MD, Frederico Hamman    . Sinus tachycardia 04/13   Holter: NSR, rare PAcs and PVCs  . Tobacco abuse     Past Surgical History:  Procedure Laterality Date  . INSERTION PROSTATE RADIATION SEED      Family History  Problem Relation Age of Onset  . Coronary artery disease Father        CABG x2  . Heart disease Father 59       CABG  . Stroke Unknown        Grandparent  . Breast cancer Unknown        Family history -1st degree relative <50  . Heart disease Paternal Grandfather     Past Medical  History, Surgical History, Social History, Family History, Problem List, Medications, and Allergies have been reviewed and updated if relevant.  Review of Systems: Pertinent positives are listed above.  Otherwise, a full 14 point review of systems has been done in full and it is negative except where it is noted positive.  Objective:   BP 126/68   Pulse 77   Temp 98.5 F (36.9 C) (Temporal)   Ht 5' 8.5" (1.74 m)   Wt 207 lb 4 oz (94 kg)   SpO2 98%   BMI 31.05 kg/m    Ideal Body Weight: Weight in (lb) to have BMI = 25: 166.5  Ideal Body Weight: Weight in (lb) to have BMI = 25: 166.5 No exam data present Depression screen [facility] 2/9 10/19/2019 09/01/2018 06/24/2017 05/13/2017  Decreased Interest 0 0 1 3  Down, Depressed, Hopeless 0 0 0 3  PHQ - 2 Score 0 0 1 6  Altered sleeping - - 3 3  Tired, decreased energy - - 0 2  Change in appetite - - 0 2  Feeling bad or failure about yourself  - - 0 2  Trouble concentrating - - 0 1  Moving slowly or fidgety/restless - - 0 0  Suicidal thoughts - - 0 1  PHQ-9 Score - - 4 17  Difficult doing work/chores - - Somewhat difficult Very difficult     GEN: well developed, well nourished, no acute distress Eyes: conjunctiva and lids normal, PERRLA, EOMI ENT: TM clear, nares clear, oral exam WNL Neck: supple, no lymphadenopathy, no thyromegaly, no JVD Pulm: clear to auscultation and percussion, respiratory effort normal CV: regular rate and rhythm, S1-S2, no murmur, rub or gallop, no bruits, peripheral pulses normal and symmetric, no cyanosis, clubbing, edema or varicosities GI: soft, non-tender; no hepatosplenomegaly, masses; active bowel sounds all quadrants GU: no hernia, testicular mass, penile discharge Lymph: no cervical, axillary or inguinal adenopathy MSK: gait normal, muscle tone and strength WNL, no joint swelling, effusions, discoloration, crepitus  SKIN: clear, good turgor, color WNL, no rashes, lesions, or ulcerations Neuro: normal mental status, normal strength, sensation, and motion Psych: alert; oriented to person, place and time, normally interactive and not anxious or depressed in appearance.  All labs reviewed with patient. Results for orders placed or performed in visit on 10/12/19  Hemoglobin A1c  Result Value Ref Range   Hgb A1c MFr Bld 5.9 4.6 - 6.5 %  PSA, Total with Reflex to PSA, Free  Result Value Ref Range   PSA, Total <0.1 < OR = 4.0 ng/mL  CBC with Differential/Platelet  Result Value  Ref Range   WBC 8.4 4.0 - 10.5 K/uL   RBC 4.77 4.22 - 5.81 Mil/uL   Hemoglobin 14.5 13.0 - 17.0 g/dL   HCT 42.8 39.0 - 52.0 %   MCV 89.8 78.0 - 100.0 fl   MCHC 33.9 30.0 - 36.0 g/dL   RDW 13.5 11.5 - 15.5 %   Platelets 288.0 150.0 - 400.0 K/uL   Neutrophils Relative % 66.5 43.0 - 77.0 %   Lymphocytes Relative 21.8 12.0 - 46.0 %   Monocytes Relative 7.9 3.0 - 12.0 %   Eosinophils Relative 2.8 0.0 - 5.0 %   Basophils Relative 1.0 0.0 - 3.0 %   Neutro Abs 5.6 1.4 - 7.7 K/uL   Lymphs Abs 1.8 0.7 - 4.0 K/uL   Monocytes Absolute 0.7 0.1 - 1.0 K/uL   Eosinophils Absolute 0.2 0.0 - 0.7 K/uL   Basophils Absolute 0.1 0.0 -  0.1 K/uL  Basic metabolic panel  Result Value Ref Range   Sodium 137 135 - 145 mEq/L   Potassium 4.1 3.5 - 5.1 mEq/L   Chloride 98 96 - 112 mEq/L   CO2 32 19 - 32 mEq/L   Glucose, Bld 114 (H) 70 - 99 mg/dL   BUN 12 6 - 23 mg/dL   Creatinine, Ser 1.39 0.40 - 1.50 mg/dL   GFR 52.03 (L) >60.00 mL/min   Calcium 9.5 8.4 - 10.5 mg/dL  Hepatic function panel  Result Value Ref Range   Total Bilirubin 0.7 0.2 - 1.2 mg/dL   Bilirubin, Direct 0.1 0.0 - 0.3 mg/dL   Alkaline Phosphatase 55 39 - 117 U/L   AST 18 0 - 37 U/L   ALT 19 0 - 53 U/L   Total Protein 7.3 6.0 - 8.3 g/dL   Albumin 4.5 3.5 - 5.2 g/dL  Lipid panel  Result Value Ref Range   Cholesterol 153 0 - 200 mg/dL   Triglycerides 256.0 (H) 0.0 - 149.0 mg/dL   HDL 41.60 >39.00 mg/dL   VLDL 51.2 (H) 0.0 - 40.0 mg/dL   Total CHOL/HDL Ratio 4    NonHDL 111.48   LDL cholesterol, direct  Result Value Ref Range   Direct LDL 77.0 mg/dL    Assessment and Plan:     ICD-10-CM   1. Healthcare maintenance  Z00.00     Health Maintenance Exam: The patient's preventative maintenance and recommended screening tests for an annual wellness exam were reviewed in full today. Brought up to date unless services declined.  Counselled on the importance of diet, exercise, and its role in overall health and mortality. The  patient's FH and SH was reviewed, including their home life, tobacco status, and drug and alcohol status.  Follow-up in 1 year for physical exam or additional follow-up below.  Follow-up: No follow-ups on file. Or follow-up in 1 year if not noted.  No orders of the defined types were placed in this encounter.  Medications Discontinued During This Encounter  Medication Reason  . meloxicam (MOBIC) 15 MG tablet Completed Course  . methylPREDNISolone (MEDROL DOSEPAK) 4 MG TBPK tablet Completed Course  . varenicline (CHANTIX STARTING MONTH PAK) 0.5 MG X 11 & 1 MG X 42 tablet Completed Course  . varenicline (CHANTIX) 1 MG tablet Completed Course   No orders of the defined types were placed in this encounter.   Signed,  Maud Deed. Antionette Luster, MD   Allergies as of 10/19/2019   No Known Allergies     Medication List       Accurate as of October 19, 2019  8:31 AM. If you have any questions, ask your nurse or doctor.        STOP taking these medications   Chantix Starting Month Pak 0.5 MG X 11 & 1 MG X 42 tablet Generic drug: varenicline Stopped by: Owens Loffler, MD   meloxicam 15 MG tablet Commonly known as: MOBIC Stopped by: Owens Loffler, MD   methylPREDNISolone 4 MG Tbpk tablet Commonly known as: MEDROL DOSEPAK Stopped by: Owens Loffler, MD   varenicline 1 MG tablet Commonly known as: Chantix Stopped by: Owens Loffler, MD     TAKE these medications   buPROPion 150 MG 24 hr tablet Commonly known as: WELLBUTRIN XL TAKE ONE TABLET BY MOUTH DAILY   citalopram 40 MG tablet Commonly known as: CELEXA Take 1 tablet by mouth daily.   clonazePAM 0.5 MG tablet Commonly known as: KLONOPIN TAKE ONE  TABLET BY MOUTH TWICE DAILY AS NEEDED   hydrochlorothiazide 25 MG tablet Commonly known as: HYDRODIURIL Take 1 tablet by mouth daily   ibuprofen 200 MG tablet Commonly known as: ADVIL Take 800 mg by mouth every 6 (six) hours as needed for headache or moderate pain.     pantoprazole 40 MG tablet Commonly known as: PROTONIX Take 1 tablet by mouth daily   rosuvastatin 10 MG tablet Commonly known as: Crestor Take 1 tablet (10 mg total) by mouth daily.   sildenafil 20 MG tablet Commonly known as: Revatio 2 to 5 tabs 30 mins prior to intercourse. Make sure he understands this is cash not to be run through insurance.   tamsulosin 0.4 MG Caps capsule Commonly known as: FLOMAX Take one capsule by mouth daily

## 2019-11-02 ENCOUNTER — Encounter: Payer: Self-pay | Admitting: Family Medicine

## 2019-11-02 ENCOUNTER — Ambulatory Visit: Payer: Managed Care, Other (non HMO) | Admitting: Family Medicine

## 2019-11-02 ENCOUNTER — Ambulatory Visit (INDEPENDENT_AMBULATORY_CARE_PROVIDER_SITE_OTHER)
Admission: RE | Admit: 2019-11-02 | Discharge: 2019-11-02 | Disposition: A | Discharge status: Home / Self Care | Payer: Managed Care, Other (non HMO) | Source: Ambulatory Visit | Attending: Family Medicine | Admitting: Family Medicine

## 2019-11-02 ENCOUNTER — Other Ambulatory Visit: Payer: Self-pay

## 2019-11-02 VITALS — BP 124/74 | HR 94 | Temp 98.1°F | Ht 68.5 in | Wt 205.0 lb

## 2019-11-02 DIAGNOSIS — M25532 Pain in left wrist: Secondary | ICD-10-CM

## 2019-11-02 DIAGNOSIS — M25432 Effusion, left wrist: Secondary | ICD-10-CM

## 2019-11-02 NOTE — Patient Instructions (Signed)
Good to see you today  I will notify you of xray results  Can take ibuprofen 400 mg (two tabs) every 8 to 12 hours as needed, heat, ice- whichever feels good  Brace at night, during day for comfort, remove and do range of motion every 4-6 hours

## 2019-11-02 NOTE — Progress Notes (Signed)
   Subjective:    Patient ID: Logan Norris, male    DOB: 02-07-1961, 59 y.o.   MRN: [id number]  HPI  Chief Complaint  Patient presents with  . Wrist Pain    x3 month, left wrist, no injury that he is aware of - hurts when lifting items, bending wrist side to side    This is a 59 yo male who presents today for above cc. Has been occurring over several months, has been getting worse. Pain with moving wrist and lateral movements. Has noticed some swelling. 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 Pain worse this am, has gotten better as day progressed. Has not taken anything for pain. Feels that grasp is weak. No numbness or tingling. No arm or neck pain.  No known injury or overuse. Did play golf 2 days ago, not sure if that exacerbated pain.  Had CPE with his PCP about 2 weeks ago, did not mention this as it was not bothering him as much.   Review of Systems Per HPI    Objective:   Physical Exam Vitals reviewed.  Constitutional:      General: He is not in acute distress.    Appearance: Normal appearance. He is obese. He is not ill-appearing, toxic-appearing or diaphoretic.  HENT:     Head: Normocephalic and atraumatic.  Eyes:     Conjunctiva/sclera: Conjunctivae normal.  Cardiovascular:     Rate and Rhythm: Normal rate.  Pulmonary:     Effort: Pulmonary effort is normal.  Musculoskeletal:     Right wrist: Normal.     Left wrist: Swelling (mild lateral and medial) and tenderness (lateral edge) present. No deformity, effusion or crepitus. Normal range of motion. Normal pulse.     Comments: Decreased grip strength. Appears mildly swollen compared to right wrist. No erythema.   Skin:    General: Skin is warm and dry.  Neurological:     Mental Status: He is alert and oriented to person, place, and time.  Psychiatric:        Mood and Affect: Mood normal.        Behavior: Behavior normal.        Thought Content: Thought content normal.        Judgment: Judgment normal.       BP 124/74  (BP Location: Left Arm, Patient Position: Sitting, Cuff Size: Normal)   Pulse 94   Temp 98.1 F (36.7 C) (Temporal)   Ht 5' 8.5" (1.74 m)   Wt 205 lb (93 kg)   SpO2 96%   BMI 30.72 kg/m  Wt Readings from Last 3 Encounters:  11/02/19 205 lb (93 kg)  10/19/19 207 lb 4 oz (94 kg)  09/01/18 209 lb 8 oz (95 kg)       Assessment & Plan:  1. Left wrist pain - will check xray since slightly swollen, pain x several months - if negative, will attempt conservative measures- ice/heat, splinting, otc NSAIDs, follow up with PCP; if abnormal, will refer to ortho - DG Wrist Complete Left; Future - patient was fitted with wrist brace   2. Pain and swelling of left wrist - per #1 - DG Wrist Complete Left; Future   Clarene Reamer, FNP-BC  [place] [facility] at [facility]  11/02/2019 5:10 PM

## 2019-11-30 ENCOUNTER — Ambulatory Visit: Payer: Managed Care, Other (non HMO) | Admitting: Dermatology

## 2019-12-04 ENCOUNTER — Other Ambulatory Visit: Payer: Self-pay | Admitting: Family Medicine

## 2019-12-14 ENCOUNTER — Encounter: Payer: Self-pay | Admitting: Family Medicine

## 2019-12-14 ENCOUNTER — Ambulatory Visit (INDEPENDENT_AMBULATORY_CARE_PROVIDER_SITE_OTHER)
Admission: RE | Admit: 2019-12-14 | Discharge: 2019-12-14 | Disposition: A | Discharge status: Home / Self Care | Payer: Managed Care, Other (non HMO) | Source: Ambulatory Visit | Attending: Family Medicine | Admitting: Family Medicine

## 2019-12-14 ENCOUNTER — Ambulatory Visit (INDEPENDENT_AMBULATORY_CARE_PROVIDER_SITE_OTHER): Payer: Managed Care, Other (non HMO) | Admitting: Family Medicine

## 2019-12-14 ENCOUNTER — Other Ambulatory Visit: Payer: Self-pay

## 2019-12-14 VITALS — BP 114/70 | HR 86 | Temp 97.2°F | Ht 68.5 in | Wt 206.8 lb

## 2019-12-14 DIAGNOSIS — M7502 Adhesive capsulitis of left shoulder: Secondary | ICD-10-CM | POA: Diagnosis not present

## 2019-12-14 DIAGNOSIS — M25512 Pain in left shoulder: Secondary | ICD-10-CM | POA: Diagnosis not present

## 2019-12-14 DIAGNOSIS — G8929 Other chronic pain: Secondary | ICD-10-CM | POA: Diagnosis not present

## 2019-12-14 DIAGNOSIS — M778 Other enthesopathies, not elsewhere classified: Secondary | ICD-10-CM | POA: Diagnosis not present

## 2019-12-14 MED ORDER — PREDNISONE 20 MG PO TABS: ORAL_TABLET | ORAL | 0 refills | Status: DC

## 2019-12-14 NOTE — Progress Notes (Signed)
Logan Veldman T. Traye Bates, MD, [place]  Primary Care and [place] at [facility] [place], [address]  Phone: [phone]  FAX: [phone]  Logan Norris - 59 y.o. male  MRN [id number]  Date of Birth: 01-09-1961  Date: 12/14/2019  PCP: Owens Loffler, MD  Referral: Owens Loffler, MD  Chief Complaint  Patient presents with  . wrist and shoulder pain    shoulder for years, wrist for months    This visit occurred during the SARS-CoV-2 public health emergency.  Safety protocols were in place, including screening questions prior to the visit, additional usage of staff PPE, and extensive cleaning of exam room while observing appropriate contact time as indicated for disinfecting solutions.   Subjective:   Logan Norris is a 58 y.o. very pleasant male patient with Body mass index is 30.98 kg/m. who presents with the following:  Left wrist in the ulnar aspect.  Only certain ways it will hurt and no accident.  A couple of months.  L 4th and 5th dorsal compartments.  This has been ongoing for couple of months.  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  He denies any kind of specific injury at all.  He did see Mrs. Arelia Longest on November 02, 2019.  At that point he was placed in a cock-up wrist splint and given some oral NSAIDs.  Does feel somewhat better, but he is still having quite a bit of symptoms with ulnar deviation with extension at four and five on the left side.  Left shoulder, cannot abuct it. Restricted abd for years.  He notes that he is still able to play golf, and he is not having any difficulty nor has he ever had any difficulty doing lower activities.  He is never had any kind of trauma, dislocation, or shoulder surgery.  He has no numbness or tingling at all.  He does not have any shoulder blade pain pain in the neck.  Terminal ROM, abd and IROM neer  negative    Review of Systems is noted in the HPI, as appropriate   Objective:   BP 114/70   Pulse 86   Temp (!) 97.2 F (36.2 C) (Skin)   Ht 5' 8.5" (1.74 m)   Wt 206 lb 12 oz (93.8 kg)   SpO2 97%   BMI 30.98 kg/m    GEN: No acute distress; alert,appropriate. PULM: Breathing comfortably in no respiratory distress PSYCH: Normally interactive.   Shoulder: R and L Inspection: No muscle wasting or winging Ecchymosis/edema: neg  AC joint, scapula, clavicle: NT Cervical spine: NT, full ROM Spurling's: neg ABNORMAL SIDE TESTED: Left UNLESS OTHERWISE NOTED, THE CONTRALATERAL SIDE HAS FULL RANGE OF MOTION. Abduction: 5/5, LIMITED TO lacks 30 degrees of motion compared to the contralateral side DEGREES Flexion: 5/5, LIMITED TO LACKS 15 DEGREES OF MOTION COMPARED TO THE CONTRALATERAL SIDE DEGNO ROM  IR, lift-off: 5/5. TESTED AT 90 DEGREES OF ABDUCTION, LIMITED TO lacks 40 degrees of motion compared to the contralateral side DEGREES ER at neutral:  5/5, TESTED AT 90 DEGREES OF ABDUCTION, LIMITED TO lacks 5 degrees of external rotation compared to the contralateral side DEGREES AC crossover and compression: PAIN Drop Test: neg Empty Can: neg Supraspinatus insertion: NT Bicipital groove: NT Neer testing and Michel Bickers test are both negative. C5-T1 intact Sensation intact Grip 5/5    At the wrist on the left  the patient has some pain with extension at the fourth and fifth digit as well has some pain with ulnar deviation.  The entirety of the left wrist and hand exam is entirely nontender and nonprovokable   He does have 5/5 grip strength in all directions.  Radiology: No results found.  Assessment and Plan:     ICD-10-CM   1. Adhesive capsulitis of left shoulder  M75.02   2. Chronic left shoulder pain  M25.512 DG Shoulder Left   G89.29   3. Tendonitis of wrist, left  M77.8    Extensor tendinitis if digits four and five on the left hand.  I think that this will do  quite well with continued conservative care.  Frozen shoulder, he is not really having any significant glenohumeral arthritis.  Films were reviewed by myself independently.  14 days of steroids.  [facility] range of motion protocol.  Follow-up: Return in about 2 months (around 02/13/2020) for f/u shoulder.  Meds ordered this encounter  Medications  . predniSONE (DELTASONE) 20 MG tablet    Sig: 2 tabs po for 7 days, then 1 tab po for 7 days    Dispense:  21 tablet    Refill:  0   Medications Discontinued During This Encounter  Medication Reason  . sildenafil (REVATIO) 20 MG tablet Change in therapy   Orders Placed This Encounter  Procedures  . DG Shoulder Left    Signed,  Frederico Hamman T. Jacquise Rarick, MD   Outpatient Encounter Medications as of 12/14/2019  Medication Sig  . buPROPion (WELLBUTRIN XL) 150 MG 24 hr tablet TAKE ONE TABLET BY MOUTH EVERY DAY  . citalopram (CELEXA) 40 MG tablet Take 1 tablet by mouth daily.  . clonazePAM (KLONOPIN) 0.5 MG tablet TAKE ONE TABLET BY MOUTH TWICE DAILY AS NEEDED  . hydrochlorothiazide (HYDRODIURIL) 25 MG tablet Take 1 tablet by mouth daily  . ibuprofen (ADVIL,MOTRIN) 200 MG tablet Take 800 mg by mouth every 6 (six) hours as needed for headache or moderate pain.  . pantoprazole (PROTONIX) 40 MG tablet Take 1 tablet by mouth daily  . rosuvastatin (CRESTOR) 10 MG tablet Take 1 tablet (10 mg total) by mouth daily.  . tadalafil (CIALIS) 5 MG tablet Take 1 tablet (5 mg total) by mouth daily.  . tamsulosin (FLOMAX) 0.4 MG CAPS capsule Take one capsule by mouth daily  . predniSONE (DELTASONE) 20 MG tablet 2 tabs po for 7 days, then 1 tab po for 7 days  . [DISCONTINUED] sildenafil (REVATIO) 20 MG tablet 2 to 5 tabs 30 mins prior to intercourse. Make sure he understands this is cash not to be run through insurance. (Patient not taking: Reported on 12/14/2019)   No facility-administered encounter medications on file as of 12/14/2019.

## 2019-12-16 ENCOUNTER — Ambulatory Visit: Payer: Managed Care, Other (non HMO) | Admitting: Family Medicine

## 2020-01-08 ENCOUNTER — Other Ambulatory Visit: Payer: Self-pay | Admitting: Family Medicine

## 2020-02-09 ENCOUNTER — Other Ambulatory Visit: Payer: Self-pay | Admitting: Family Medicine

## 2020-02-10 ENCOUNTER — Other Ambulatory Visit: Payer: Self-pay | Admitting: Family Medicine

## 2020-02-15 ENCOUNTER — Ambulatory Visit: Payer: Managed Care, Other (non HMO) | Admitting: Family Medicine

## 2020-03-05 ENCOUNTER — Other Ambulatory Visit: Payer: Self-pay | Admitting: Family Medicine

## 2020-03-28 ENCOUNTER — Other Ambulatory Visit: Payer: Self-pay | Admitting: Family Medicine

## 2020-03-28 MED ORDER — VARENICLINE TARTRATE 1 MG PO TABS
1.0000 mg | ORAL_TABLET | Freq: Two times a day (BID) | ORAL | 3 refills | Status: DC
Start: 2020-03-28 — End: 2020-12-19

## 2020-03-28 MED ORDER — CHANTIX STARTING MONTH PAK 0.5 MG X 11 & 1 MG X 42 PO TABS
ORAL_TABLET | ORAL | 0 refills | Status: DC
Start: 2020-03-28 — End: 2020-12-19

## 2020-03-28 NOTE — Telephone Encounter (Signed)
[facility] is in [place]?   Did they mean to send it to [facility]?  If so, ok to send as written

## 2020-03-28 NOTE — Telephone Encounter (Signed)
Left message for Keith to return my call.    

## 2020-03-28 NOTE — Telephone Encounter (Signed)
Prescriptions sent to [facility] in [facility] if patient calls back.

## 2020-03-28 NOTE — Telephone Encounter (Signed)
Patient called. Patient's requesting a rx for Chantix be sent to [facility]. Patient said it has been called in for him before.

## 2020-03-28 NOTE — Telephone Encounter (Signed)
Not on current medication list.   

## 2020-04-24 ENCOUNTER — Other Ambulatory Visit: Payer: Self-pay | Admitting: Family Medicine

## 2020-05-16 ENCOUNTER — Other Ambulatory Visit: Payer: Self-pay | Admitting: Family Medicine

## 2020-05-17 ENCOUNTER — Other Ambulatory Visit: Payer: Self-pay | Admitting: Family Medicine

## 2020-06-16 ENCOUNTER — Other Ambulatory Visit: Payer: Self-pay | Admitting: Family Medicine

## 2020-07-10 ENCOUNTER — Other Ambulatory Visit: Payer: Self-pay | Admitting: Family Medicine

## 2020-08-05 ENCOUNTER — Other Ambulatory Visit: Payer: Self-pay | Admitting: Family Medicine

## 2020-09-09 DIAGNOSIS — M5416 Radiculopathy, lumbar region: Secondary | ICD-10-CM | POA: Diagnosis not present

## 2020-09-17 ENCOUNTER — Other Ambulatory Visit: Payer: Self-pay | Admitting: Family Medicine

## 2020-09-19 NOTE — Telephone Encounter (Signed)
Please schedule CPE with fasting labs for sometime after March 15, for Dr. Lorelei Pont.

## 2020-09-19 NOTE — Telephone Encounter (Signed)
Left voice message for pt to call the office

## 2020-09-26 NOTE — Telephone Encounter (Signed)
Left voice message to call the office  

## 2020-09-27 DIAGNOSIS — M5416 Radiculopathy, lumbar region: Secondary | ICD-10-CM | POA: Diagnosis not present

## 2020-09-27 DIAGNOSIS — M4726 Other spondylosis with radiculopathy, lumbar region: Secondary | ICD-10-CM | POA: Diagnosis not present

## 2020-09-27 DIAGNOSIS — Z135 Encounter for screening for eye and ear disorders: Secondary | ICD-10-CM | POA: Diagnosis not present

## 2020-09-27 DIAGNOSIS — M5116 Intervertebral disc disorders with radiculopathy, lumbar region: Secondary | ICD-10-CM | POA: Diagnosis not present

## 2020-09-28 DIAGNOSIS — Z6830 Body mass index (BMI) 30.0-30.9, adult: Secondary | ICD-10-CM | POA: Diagnosis not present

## 2020-09-28 DIAGNOSIS — I10 Essential (primary) hypertension: Secondary | ICD-10-CM | POA: Diagnosis not present

## 2020-09-28 DIAGNOSIS — M25559 Pain in unspecified hip: Secondary | ICD-10-CM | POA: Diagnosis not present

## 2020-10-05 NOTE — Telephone Encounter (Signed)
Left voice message to call the office  

## 2020-10-07 NOTE — Telephone Encounter (Signed)
Sent letter

## 2020-10-11 DIAGNOSIS — M25559 Pain in unspecified hip: Secondary | ICD-10-CM | POA: Diagnosis not present

## 2020-10-11 DIAGNOSIS — M5136 Other intervertebral disc degeneration, lumbar region: Secondary | ICD-10-CM | POA: Diagnosis not present

## 2020-10-11 DIAGNOSIS — M5416 Radiculopathy, lumbar region: Secondary | ICD-10-CM | POA: Diagnosis not present

## 2020-11-14 ENCOUNTER — Other Ambulatory Visit: Payer: Self-pay

## 2020-11-14 ENCOUNTER — Other Ambulatory Visit (INDEPENDENT_AMBULATORY_CARE_PROVIDER_SITE_OTHER): Payer: BC Managed Care – PPO

## 2020-11-14 DIAGNOSIS — Z79899 Other long term (current) drug therapy: Secondary | ICD-10-CM

## 2020-11-14 DIAGNOSIS — Z131 Encounter for screening for diabetes mellitus: Secondary | ICD-10-CM

## 2020-11-14 DIAGNOSIS — Z125 Encounter for screening for malignant neoplasm of prostate: Secondary | ICD-10-CM

## 2020-11-14 DIAGNOSIS — E785 Hyperlipidemia, unspecified: Secondary | ICD-10-CM

## 2020-11-14 LAB — CBC WITH DIFFERENTIAL/PLATELET
Basophils Absolute: 0.1 10*3/uL (ref 0.0–0.1)
Basophils Relative: 0.8 % (ref 0.0–3.0)
Eosinophils Absolute: 0.1 10*3/uL (ref 0.0–0.7)
Eosinophils Relative: 1.1 % (ref 0.0–5.0)
HCT: 44.4 % (ref 39.0–52.0)
Hemoglobin: 14.8 g/dL (ref 13.0–17.0)
Lymphocytes Relative: 23.2 % (ref 12.0–46.0)
Lymphs Abs: 1.7 10*3/uL (ref 0.7–4.0)
MCHC: 33.4 g/dL (ref 30.0–36.0)
MCV: 89.7 fl (ref 78.0–100.0)
Monocytes Absolute: 0.5 10*3/uL (ref 0.1–1.0)
Monocytes Relative: 7.1 % (ref 3.0–12.0)
Neutro Abs: 4.9 10*3/uL (ref 1.4–7.7)
Neutrophils Relative %: 67.8 % (ref 43.0–77.0)
Platelets: 314 10*3/uL (ref 150.0–400.0)
RBC: 4.95 Mil/uL (ref 4.22–5.81)
RDW: 14.2 % (ref 11.5–15.5)
WBC: 7.2 10*3/uL (ref 4.0–10.5)

## 2020-11-14 LAB — HEPATIC FUNCTION PANEL
ALT: 12 U/L (ref 0–53)
AST: 13 U/L (ref 0–37)
Albumin: 4.6 g/dL (ref 3.5–5.2)
Alkaline Phosphatase: 55 U/L (ref 39–117)
Bilirubin, Direct: 0.1 mg/dL (ref 0.0–0.3)
Total Bilirubin: 0.5 mg/dL (ref 0.2–1.2)
Total Protein: 7 g/dL (ref 6.0–8.3)

## 2020-11-14 LAB — BASIC METABOLIC PANEL
BUN: 15 mg/dL (ref 6–23)
CO2: 33 mEq/L — ABNORMAL HIGH (ref 19–32)
Calcium: 9.9 mg/dL (ref 8.4–10.5)
Chloride: 98 mEq/L (ref 96–112)
Creatinine, Ser: 1.29 mg/dL (ref 0.40–1.50)
GFR: 59.79 mL/min — ABNORMAL LOW (ref >60.00)
Glucose, Bld: 116 mg/dL — ABNORMAL HIGH (ref 70–99)
Potassium: 4.2 mEq/L (ref 3.5–5.1)
Sodium: 138 mEq/L (ref 135–145)

## 2020-11-14 LAB — LIPID PANEL
Cholesterol: 164 mg/dL (ref 0–200)
HDL: 45.3 mg/dL (ref >39.00)
LDL Cholesterol: 86 mg/dL (ref 0–99)
NonHDL: 119.13
Total CHOL/HDL Ratio: 4
Triglycerides: 165 mg/dL — ABNORMAL HIGH (ref 0.0–149.0)
VLDL: 33 mg/dL (ref 0.0–40.0)

## 2020-11-14 LAB — HEMOGLOBIN A1C: Hgb A1c MFr Bld: 6.1 % (ref 4.6–6.5)

## 2020-11-15 LAB — PSA, TOTAL WITH REFLEX TO PSA, FREE: PSA, Total: <0.1 ng/mL (ref <=4.0)

## 2020-11-24 ENCOUNTER — Encounter: Payer: Managed Care, Other (non HMO) | Admitting: Family Medicine

## 2020-12-03 ENCOUNTER — Other Ambulatory Visit: Payer: Self-pay | Admitting: Family Medicine

## 2020-12-19 ENCOUNTER — Encounter: Payer: Self-pay | Admitting: Family Medicine

## 2020-12-19 ENCOUNTER — Other Ambulatory Visit: Payer: Self-pay

## 2020-12-19 ENCOUNTER — Ambulatory Visit (INDEPENDENT_AMBULATORY_CARE_PROVIDER_SITE_OTHER): Payer: BC Managed Care – PPO | Admitting: Family Medicine

## 2020-12-19 VITALS — BP 110/78 | HR 104 | Temp 98.1°F | Ht 68.0 in | Wt 193.0 lb

## 2020-12-19 DIAGNOSIS — Z0000 Encounter for general adult medical examination without abnormal findings: Secondary | ICD-10-CM

## 2020-12-19 DIAGNOSIS — L989 Disorder of the skin and subcutaneous tissue, unspecified: Secondary | ICD-10-CM | POA: Diagnosis not present

## 2020-12-19 NOTE — Progress Notes (Signed)
Logan Sebring T. Kearney Evitt, MD, [place]  Primary Care and [place] at [facility] [place], [address]  Phone: [phone]  FAX: [place] - 60 y.o. male  MRN [id number]  Date of Birth: 1961-01-19  Date: 12/19/2020  PCP: Owens Loffler, MD  Referral: Owens Loffler, MD  Chief Complaint  Patient presents with  . Annual Exam    This visit occurred during the SARS-CoV-2 public health emergency.  Safety protocols were in place, including screening questions prior to the visit, additional usage of staff PPE, and extensive cleaning of exam room while observing appropriate contact time as indicated for disinfecting solutions.   Patient Care Team: Owens Loffler, MD as PCP - General Subjective:   Logan Norris is a 60 y.o. pleasant patient who presents with the following:  Preventative Health Maintenance Visit:  Health Maintenance Summary Reviewed and updated, unless pt declines services.  Tobacco History Reviewed. Still smoke.  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  Works a lot, Engineer, manufacturing systems, yard work. STD concerns: no risk or activity to increase risk. Desire is low. Drug Use: None  Covid booster.  He needs #3  Decreased desire to have sex. Status post prostate cancer He does take Cialis 5 mg daily   Health Maintenance  Topic Date Due  . COVID-19 Vaccine (3 - Pfizer risk 4-dose series) 06/09/2020  . INFLUENZA VACCINE  03/06/2021  . COLONOSCOPY (Pts 45-63yrs Insurance coverage will need to be confirmed)  01/18/2025  . TETANUS/TDAP  12/28/2026  . Hepatitis C Screening  Completed  . HIV Screening  Completed  . HPV VACCINES  Aged Out   Immunization History  Administered Date(s) Administered  . Influenza Whole 06/06/2010  . Influenza, Quadrivalent, Recombinant, Inj, Pf  05/18/2018  . Influenza,inj,Quad PF,6+ Mos 06/02/2019  . Influenza-Unspecified 05/07/2017  . PFIZER(Purple Top)SARS-COV-2 Vaccination 04/03/2020, 05/12/2020  . Td 08/06/2005  . Tdap 12/27/2016   Patient Active Problem List   Diagnosis Date Noted  . Depression, major, recurrent, moderate ([place]) 05/13/2017  . GAD (generalized anxiety disorder) 05/13/2017  . Hypertriglyceridemia 05/27/2015  . Bleeding ulcer, hx of 09/30/2012  . Tobacco abuse   . Prostate cancer ([place]) 10/23/2010  . HYPERTENSION 02/09/2010  . High cholesterol 01/19/2009  . GERD 01/19/2009  . Hx of diverticulitis 01/19/2009    Past Medical History:  Diagnosis Date  . Anxiety 09/20/2011  . Basal cell carcinoma 09/17/2013   Left forehead. Excised: 10/14/2013, margins free  . Basal cell carcinoma 09/17/2013   Right upper eyelid  . Basal cell carcinoma 05/06/2019   Right mid ear helix. Nodular pattern. Tx: [facility] 06/05/2019  . Depression   . Diverticulitis of colon   . GERD (gastroesophageal reflux disease)   . H/O: gastrointestinal hemorrhage   . HLD (hyperlipidemia)   . HTN (hypertension)   . Hx of diverticulitis 01/19/2009   Centricity Description: DIVERTICULITIS, HX OF Qualifier: Diagnosis of  By: Lorelei Pont MD, Jossette Zirbel   Centricity Description: GASTROINTESTINAL HEMORRHAGE, HX OF Qualifier: Diagnosis of  By: Lorelei Pont MD, Frederico Hamman    . Hypertriglyceridemia 05/27/2015  . Prostate cancer ([place]) 10/23/2010   Qualifier: Diagnosis of  By: Lorelei Pont MD, Frederico Hamman    . Tobacco abuse     Past Surgical History:  Procedure Laterality Date  . INSERTION PROSTATE RADIATION SEED      Family History  Problem  Relation Age of Onset  . Coronary artery disease Father        CABG x2  . Heart disease Father 56       CABG  . Stroke Other        Grandparent  . Breast cancer Other        Family history -1st degree relative <50  . Heart disease Paternal Grandfather     Past Medical History, Surgical History, Social History, Family  History, Problem List, Medications, and Allergies have been reviewed and updated if relevant.  Review of Systems: Pertinent positives are listed above.  Otherwise, a full 14 point review of systems has been done in full and it is negative except where it is noted positive.  He does have some skin lesions behind his ears and also on his right elbow he is concerned about  Objective:   BP 110/78   Pulse (!) 104   Temp 98.1 F (36.7 C) (Temporal)   Ht 5\' 8"  (1.727 m)   Wt 193 lb (87.5 kg)   SpO2 96%   BMI 29.35 kg/m  Ideal Body Weight: Weight in (lb) to have BMI = 25: 164.1  Ideal Body Weight: Weight in (lb) to have BMI = 25: 164.1 No exam data present Depression screen [facility] 2/9 10/19/2019 09/01/2018 06/24/2017 05/13/2017  Decreased Interest 0 0 1 3  Down, Depressed, Hopeless 0 0 0 3  PHQ - 2 Score 0 0 1 6  Altered sleeping - - 3 3  Tired, decreased energy - - 0 2  Change in appetite - - 0 2  Feeling bad or failure about yourself  - - 0 2  Trouble concentrating - - 0 1  Moving slowly or fidgety/restless - - 0 0  Suicidal thoughts - - 0 1  PHQ-9 Score - - 4 17  Difficult doing work/chores - - Somewhat difficult Very difficult     GEN: well developed, well nourished, no acute distress Eyes: conjunctiva and lids normal, PERRLA, EOMI ENT: TM clear, nares clear, oral exam WNL Neck: supple, no lymphadenopathy, no thyromegaly, no JVD Pulm: clear to auscultation and percussion, respiratory effort normal CV: regular rate and rhythm, S1-S2, no murmur, rub or gallop, no bruits, peripheral pulses normal and symmetric, no cyanosis, clubbing, edema or varicosities GI: soft, non-tender; no hepatosplenomegaly, masses; active bowel sounds all quadrants GU: deferred Lymph: no cervical, axillary or inguinal adenopathy MSK: gait normal, muscle tone and strength WNL, no joint swelling, effusions, discoloration, crepitus  SKIN: clear, good turgor, color WNL, see lesions as below, he also has a  brownish appearance lesion behind his right ear  neuro: normal mental status, normal strength, sensation, and motion Psych: alert; oriented to person, place and time, normally interactive and not anxious or depressed in appearance.        All labs reviewed with patient. Results for orders placed or performed in visit on 11/14/20  Lipid panel  Result Value Ref Range   Cholesterol 164 0 - 200 mg/dL   Triglycerides 165.0 (H) 0.0 - 149.0 mg/dL   HDL 45.30 >39.00 mg/dL   VLDL 33.0 0.0 - 40.0 mg/dL   LDL Cholesterol 86 0 - 99 mg/dL   Total CHOL/HDL Ratio 4    NonHDL 119.13   Hepatic function panel  Result Value Ref Range   Total Bilirubin 0.5 0.2 - 1.2 mg/dL   Bilirubin, Direct 0.1 0.0 - 0.3 mg/dL   Alkaline Phosphatase 55 39 - 117 U/L   AST 13  0 - 37 U/L   ALT 12 0 - 53 U/L   Total Protein 7.0 6.0 - 8.3 g/dL   Albumin 4.6 3.5 - 5.2 g/dL  Basic metabolic panel  Result Value Ref Range   Sodium 138 135 - 145 mEq/L   Potassium 4.2 3.5 - 5.1 mEq/L   Chloride 98 96 - 112 mEq/L   CO2 33 (H) 19 - 32 mEq/L   Glucose, Bld 116 (H) 70 - 99 mg/dL   BUN 15 6 - 23 mg/dL   Creatinine, Ser 1.29 0.40 - 1.50 mg/dL   GFR 59.79 (L) >60.00 mL/min   Calcium 9.9 8.4 - 10.5 mg/dL  CBC with Differential/Platelet  Result Value Ref Range   WBC 7.2 4.0 - 10.5 K/uL   RBC 4.95 4.22 - 5.81 Mil/uL   Hemoglobin 14.8 13.0 - 17.0 g/dL   HCT 44.4 39.0 - 52.0 %   MCV 89.7 78.0 - 100.0 fl   MCHC 33.4 30.0 - 36.0 g/dL   RDW 14.2 11.5 - 15.5 %   Platelets 314.0 150.0 - 400.0 K/uL   Neutrophils Relative % 67.8 43.0 - 77.0 %   Lymphocytes Relative 23.2 12.0 - 46.0 %   Monocytes Relative 7.1 3.0 - 12.0 %   Eosinophils Relative 1.1 0.0 - 5.0 %   Basophils Relative 0.8 0.0 - 3.0 %   Neutro Abs 4.9 1.4 - 7.7 K/uL   Lymphs Abs 1.7 0.7 - 4.0 K/uL   Monocytes Absolute 0.5 0.1 - 1.0 K/uL   Eosinophils Absolute 0.1 0.0 - 0.7 K/uL   Basophils Absolute 0.1 0.0 - 0.1 K/uL  Hemoglobin A1c  Result Value Ref Range    Hgb A1c MFr Bld 6.1 4.6 - 6.5 %  PSA, Total with Reflex to PSA, Free  Result Value Ref Range   PSA, Total <0.1 < OR = 4.0 ng/mL    Assessment and Plan:     ICD-10-CM   1. Healthcare maintenance  Z00.00   2. Skin lesions  L98.9 Ambulatory referral to Dermatology   Work on smoking. COVID #3. Cut back on sugary drinks.  He does drink a big gulp Coca-Cola every day, and he is going to stop this and possibly change to diet Coke.  Does have at least 2 skin lesions which I think are AK versus BCC versus SCC.  I think that these need to be evaluated by dermatology.  I appreciate their help.  Health Maintenance Exam: The patient's preventative maintenance and recommended screening tests for an annual wellness exam were reviewed in full today. Brought up to date unless services declined.  Counselled on the importance of diet, exercise, and its role in overall health and mortality. The patient's FH and SH was reviewed, including their home life, tobacco status, and drug and alcohol status.  Follow-up in 1 year for physical exam or additional follow-up below.  Follow-up: Return in about 1 year (around 12/19/2021) for Health Maintenance. Or follow-up in 1 year if not noted.  No orders of the defined types were placed in this encounter.  Medications Discontinued During This Encounter  Medication Reason  . varenicline (CHANTIX STARTING MONTH PAK) 0.5 MG X 11 & 1 MG X 42 tablet Error  . varenicline (CHANTIX) 1 MG tablet Error   Orders Placed This Encounter  Procedures  . Ambulatory referral to Dermatology    Signed,  Frederico Hamman T. Kendrell Lottman, MD   Allergies as of 12/19/2020   No Known Allergies     Medication List  Accurate as of Dec 19, 2020 10:03 AM. If you have any questions, ask your nurse or doctor.        STOP taking these medications   Chantix Starting Month Pak 0.5 MG X 11 & 1 MG X 42 tablet Generic drug: varenicline Stopped by: Owens Loffler, MD   varenicline 1  MG tablet Commonly known as: Chantix Stopped by: Owens Loffler, MD     TAKE these medications   buPROPion 150 MG 24 hr tablet Commonly known as: WELLBUTRIN XL TAKE ONE TABLET BY MOUTH EVERY DAY   citalopram 40 MG tablet Commonly known as: CELEXA Take 1 tablet by mouth daily.   clonazePAM 0.5 MG tablet Commonly known as: KLONOPIN TAKE ONE TABLET BY MOUTH TWICE DAILY AS NEEDED   hydrochlorothiazide 25 MG tablet Commonly known as: HYDRODIURIL Take 1 tablet by mouth daily   ibuprofen 200 MG tablet Commonly known as: ADVIL Take 800 mg by mouth every 6 (six) hours as needed for headache or moderate pain.   pantoprazole 40 MG tablet Commonly known as: PROTONIX Take 1 tablet by mouth daily   predniSONE 20 MG tablet Commonly known as: DELTASONE 2 tabs po for 7 days, then 1 tab po for 7 days   rosuvastatin 10 MG tablet Commonly known as: Crestor Take 1 tablet (10 mg total) by mouth daily.   tadalafil 5 MG tablet Commonly known as: CIALIS Take 1 tablet (5 mg total) by mouth daily.   tamsulosin 0.4 MG Caps capsule Commonly known as: FLOMAX Take one capsule by mouth daily

## 2020-12-25 ENCOUNTER — Other Ambulatory Visit: Payer: Self-pay | Admitting: Family Medicine

## 2020-12-27 ENCOUNTER — Other Ambulatory Visit: Payer: Self-pay | Admitting: Family Medicine

## 2021-01-03 ENCOUNTER — Other Ambulatory Visit: Payer: Self-pay | Admitting: Family Medicine

## 2021-01-04 NOTE — Telephone Encounter (Signed)
Pharmacy requests refill on: Rosuvastatin 10 mg   LAST REFILL: 10/07/2019 (Q-90, R-3) LAST OV: 12/19/2020 NEXT OV: Not Scheduled  PHARMACY: [facility] requests refill on: Tadalafil 5 mg   LAST REFILL: 01/08/2020 (Q-30, R-11) LAST OV: 12/19/2020 NEXT OV: Not Scheduled  PHARMACY: [facility]

## 2021-01-05 ENCOUNTER — Telehealth: Payer: Self-pay

## 2021-01-05 ENCOUNTER — Telehealth: Payer: Self-pay | Admitting: Family Medicine

## 2021-01-05 MED ORDER — HYDROCHLOROTHIAZIDE 25 MG PO TABS: 1.0000 | ORAL_TABLET | Freq: Every day | ORAL | 0 refills | Status: DC

## 2021-01-05 NOTE — Telephone Encounter (Signed)
Pharmacy requests refill on: Hydrochlorothiazide 25 mg   LAST REFILL: 04/25/2020 (Q-30, R-5) LAST OV: 12/19/2020 NEXT OV: Not Scheduled  PHARMACY: [facility]

## 2021-01-05 NOTE — Telephone Encounter (Signed)
Mrs sargent called in wanted to know if she can get it done today he completely out.

## 2021-01-05 NOTE — Telephone Encounter (Signed)
Mrs. Logan Norris called in and stated that they are having a problem with getting his prescription in the mail its been 3 weeks.    LAST APPOINTMENT DATE: 01/03/2021   NEXT APPOINTMENT DATE:@Visit  date not found  MEDICATION: hydrochlorothiazide  PHARMACY: [facility] [name] [place], [place] [address] [phone]  Let patient know to contact pharmacy at the end of the day to make sure medication is ready.  Please notify patient to allow 48-72 hours to process  Encourage patient to contact the pharmacy for refills or they can request refills through [place]:   LAST REFILL:  QTY:  REFILL DATE:    OTHER COMMENTS:    Okay for refill?  Please advise

## 2021-01-06 NOTE — Telephone Encounter (Signed)
Called and left a VM for the patient stating that there was a refill that was done yesterday for him to pick it up at the pharmacy.

## 2021-01-06 NOTE — Telephone Encounter (Signed)
This was refilled yesterday.

## 2021-01-09 NOTE — Telephone Encounter (Signed)
Left voice message to call the office  

## 2021-01-24 NOTE — Telephone Encounter (Signed)
Left voice message to call the office  

## 2021-02-10 ENCOUNTER — Other Ambulatory Visit: Payer: Self-pay | Admitting: Family Medicine

## 2021-02-14 DIAGNOSIS — L738 Other specified follicular disorders: Secondary | ICD-10-CM | POA: Diagnosis not present

## 2021-02-14 DIAGNOSIS — L711 Rhinophyma: Secondary | ICD-10-CM | POA: Diagnosis not present

## 2021-02-14 DIAGNOSIS — L820 Inflamed seborrheic keratosis: Secondary | ICD-10-CM | POA: Diagnosis not present

## 2021-02-14 DIAGNOSIS — B078 Other viral warts: Secondary | ICD-10-CM | POA: Diagnosis not present

## 2021-02-14 DIAGNOSIS — L718 Other rosacea: Secondary | ICD-10-CM | POA: Diagnosis not present

## 2021-04-17 ENCOUNTER — Other Ambulatory Visit: Payer: Self-pay | Admitting: Family Medicine

## 2021-04-20 ENCOUNTER — Ambulatory Visit: Payer: BC Managed Care – PPO | Admitting: Dermatology

## 2021-05-03 ENCOUNTER — Other Ambulatory Visit: Payer: Self-pay | Admitting: Family Medicine

## 2021-05-04 ENCOUNTER — Other Ambulatory Visit: Payer: Self-pay | Admitting: Family Medicine

## 2021-05-25 ENCOUNTER — Encounter: Payer: Self-pay | Admitting: Family Medicine

## 2021-05-25 ENCOUNTER — Ambulatory Visit (INDEPENDENT_AMBULATORY_CARE_PROVIDER_SITE_OTHER)
Admission: RE | Admit: 2021-05-25 | Discharge: 2021-05-25 | Disposition: A | Discharge status: Home / Self Care | Payer: BC Managed Care – PPO | Source: Ambulatory Visit | Attending: Family Medicine | Admitting: Family Medicine

## 2021-05-25 ENCOUNTER — Other Ambulatory Visit: Payer: Self-pay

## 2021-05-25 ENCOUNTER — Ambulatory Visit: Payer: BC Managed Care – PPO | Admitting: Family Medicine

## 2021-05-25 VITALS — BP 114/64 | HR 88 | Temp 97.9°F | Ht 68.0 in | Wt 189.1 lb

## 2021-05-25 DIAGNOSIS — R9389 Abnormal findings on diagnostic imaging of other specified body structures: Secondary | ICD-10-CM | POA: Diagnosis not present

## 2021-05-25 DIAGNOSIS — R918 Other nonspecific abnormal finding of lung field: Secondary | ICD-10-CM | POA: Diagnosis not present

## 2021-05-25 DIAGNOSIS — F17210 Nicotine dependence, cigarettes, uncomplicated: Secondary | ICD-10-CM

## 2021-05-25 DIAGNOSIS — R634 Abnormal weight loss: Secondary | ICD-10-CM | POA: Diagnosis not present

## 2021-05-25 DIAGNOSIS — Z23 Encounter for immunization: Secondary | ICD-10-CM | POA: Diagnosis not present

## 2021-05-25 DIAGNOSIS — I2510 Atherosclerotic heart disease of native coronary artery without angina pectoris: Secondary | ICD-10-CM

## 2021-05-25 DIAGNOSIS — R079 Chest pain, unspecified: Secondary | ICD-10-CM

## 2021-05-25 LAB — CBC WITH DIFFERENTIAL/PLATELET
Basophils Absolute: 0.1 10*3/uL (ref 0.0–0.1)
Basophils Relative: 1 % (ref 0.0–3.0)
Eosinophils Absolute: 0.1 10*3/uL (ref 0.0–0.7)
Eosinophils Relative: 1 % (ref 0.0–5.0)
HCT: 44.1 % (ref 39.0–52.0)
Hemoglobin: 14.9 g/dL (ref 13.0–17.0)
Lymphocytes Relative: 27.4 % (ref 12.0–46.0)
Lymphs Abs: 2.2 10*3/uL (ref 0.7–4.0)
MCHC: 33.9 g/dL (ref 30.0–36.0)
MCV: 89.5 fl (ref 78.0–100.0)
Monocytes Absolute: 0.6 10*3/uL (ref 0.1–1.0)
Monocytes Relative: 7.4 % (ref 3.0–12.0)
Neutro Abs: 5.2 10*3/uL (ref 1.4–7.7)
Neutrophils Relative %: 63.2 % (ref 43.0–77.0)
Platelets: 301 10*3/uL (ref 150.0–400.0)
RBC: 4.93 Mil/uL (ref 4.22–5.81)
RDW: 14.5 % (ref 11.5–15.5)
WBC: 8.2 10*3/uL (ref 4.0–10.5)

## 2021-05-25 LAB — BASIC METABOLIC PANEL
BUN: 14 mg/dL (ref 6–23)
CO2: 33 mEq/L — ABNORMAL HIGH (ref 19–32)
Calcium: 10.2 mg/dL (ref 8.4–10.5)
Chloride: 97 mEq/L (ref 96–112)
Creatinine, Ser: 1.15 mg/dL (ref 0.40–1.50)
GFR: 68.38 mL/min (ref >60.00)
Glucose, Bld: 92 mg/dL (ref 70–99)
Potassium: 4.3 mEq/L (ref 3.5–5.1)
Sodium: 137 mEq/L (ref 135–145)

## 2021-05-25 LAB — HEPATIC FUNCTION PANEL
ALT: 11 U/L (ref 0–53)
AST: 15 U/L (ref 0–37)
Albumin: 4.9 g/dL (ref 3.5–5.2)
Alkaline Phosphatase: 51 U/L (ref 39–117)
Bilirubin, Direct: 0.1 mg/dL (ref 0.0–0.3)
Total Bilirubin: 0.8 mg/dL (ref 0.2–1.2)
Total Protein: 7.4 g/dL (ref 6.0–8.3)

## 2021-05-25 LAB — T3, FREE: T3, Free: 3.3 pg/mL (ref 2.3–4.2)

## 2021-05-25 LAB — T4, FREE: Free T4: 0.84 ng/dL (ref 0.60–1.60)

## 2021-05-25 LAB — SEDIMENTATION RATE: Sed Rate: 8 mm/hr (ref 0–20)

## 2021-05-25 LAB — TSH: TSH: 2.79 u[IU]/mL (ref 0.35–5.50)

## 2021-05-25 NOTE — Progress Notes (Signed)
[id number]

## 2021-05-25 NOTE — Progress Notes (Addendum)
Logan Stamper T. Shandel Busic, MD, [facility] [place], [address]  Phone: [phone]  FAX: [phone]  Logan Norris - 60 y.o. male  MRN [id number]  Date of Birth: [birth year]  Date: 05/25/2021  PCP: Owens Loffler, MD  Referral: Owens Loffler, MD  Chief Complaint  Patient presents with   Weight Loss   Arm Pain    Right-Dropped refrigerator on it 3 to 4 weeks ago    This visit occurred during the SARS-CoV-2 public health emergency.  Safety protocols were in place, including screening questions prior to the visit, additional usage of staff PPE, and extensive cleaning of exam room while observing appropriate contact time as indicated for disinfecting solutions.   Subjective:   Logan Norris is a 60 y.o. very pleasant male patient with Body mass index is 28.76 kg/m. who presents with the following:  Wt Readings from Last 3 Encounters:  05/25/21 189 lb 2 oz (85.8 kg)  12/19/20 193 lb (87.5 kg)  12/14/19 206 lb 12 oz (93.8 kg)    17 pound weight loss in 18 months This is all unintentional weight loss.  He is up-to-date on all of his general health maintenance including colonoscopy.  R arm pain:  3 weeks ago, caught refrig.  This is feeling better now.  Smoker. Screening lung cancer -has not done this before 60 pack year smoking history  Colon up to date.  Not trying to lose weight at all.  182 this morning - 206 about 2 years.  24 on his scale.   No GI, pulm no coughing.  No neurological changes  No ETOH in 2 1/2 years No drugs. No health conditions.  He does have a history of well-controlled prostate cancer  Health Maintenance  Topic Date Due   Pneumococcal Vaccine 75-30 Years old (1 - PCV) Never done   Zoster Vaccines- Shingrix (1 of 2) Never done   COVID-19 Vaccine (3 - Pfizer risk series) 06/09/2020   COLONOSCOPY (Pts 45-7yrs Insurance coverage will need to be confirmed)   01/18/2025   TETANUS/TDAP  12/28/2026   INFLUENZA VACCINE  Completed   Hepatitis C Screening  Completed   HIV Screening  Completed   HPV VACCINES  Aged Out     Review of Systems is noted in the HPI, as appropriate  Patient Active Problem List   Diagnosis Date Noted   Depression, major, recurrent, moderate ([place]) 05/13/2017   GAD (generalized anxiety disorder) 05/13/2017   Hypertriglyceridemia 05/27/2015   Bleeding ulcer, hx of 09/30/2012   Tobacco abuse    Prostate cancer ([place]) 10/23/2010   HYPERTENSION 02/09/2010   High cholesterol 01/19/2009   GERD 01/19/2009   Hx of diverticulitis 01/19/2009    Past Medical History:  Diagnosis Date   Anxiety 09/20/2011   Basal cell carcinoma 09/17/2013   Left forehead. Excised: 10/14/2013, margins free   Basal cell carcinoma 09/17/2013   Right upper eyelid   Basal cell carcinoma 05/06/2019   Right mid ear helix. Nodular pattern. Tx: [facility] 06/05/2019   Depression    GERD (gastroesophageal reflux disease)    H/O: gastrointestinal hemorrhage    HLD (hyperlipidemia)    HTN (hypertension)    Hx of diverticulitis 01/19/2009   Centricity Description: DIVERTICULITIS, HX OF Qualifier: Diagnosis of  By: Lorelei Pont MD, Aurie Harroun   Centricity Description: GASTROINTESTINAL HEMORRHAGE, HX OF Qualifier: Diagnosis of  By: Sonjia Wilcoxson MD, Nashay Brickley     Hypertriglyceridemia 05/27/2015   Prostate  cancer ([place]) 10/23/2010   Qualifier: Diagnosis of  By: Lorelei Pont MD, Frederico Hamman     Tobacco abuse     Past Surgical History:  Procedure Laterality Date   INSERTION PROSTATE RADIATION SEED      Family History  Problem Relation Age of Onset   Coronary artery disease Father        CABG x2   Heart disease Father 15       CABG   Stroke Other        Grandparent   Breast cancer Other        Family history -1st degree relative <50   Heart disease Paternal Grandfather      Objective:   BP 114/64   Pulse 88   Temp 97.9 F (36.6 C) (Temporal)   Ht 5\' 8"  (1.727 m)    Wt 189 lb 2 oz (85.8 kg)   SpO2 96%   BMI 28.76 kg/m   GEN: No acute distress; alert,appropriate. PULM: Breathing comfortably in no respiratory distress PSYCH: Normally interactive.  CV: RRR, no m/g/r  PULM: Normal respiratory rate, no accessory muscle use. No wheezes, crackles or rhonchi   Laboratory and Imaging Data: Results for orders placed or performed in visit on 05/25/21  T4, free  Result Value Ref Range   Free T4 0.84 0.60 - 1.60 ng/dL  T3, free  Result Value Ref Range   T3, Free 3.3 2.3 - 4.2 pg/mL  TSH  Result Value Ref Range   TSH 2.79 0.35 - 5.50 uIU/mL  CBC with Differential/Platelet  Result Value Ref Range   WBC 8.2 4.0 - 10.5 K/uL   RBC 4.93 4.22 - 5.81 Mil/uL   Hemoglobin 14.9 13.0 - 17.0 g/dL   HCT 44.1 39.0 - 52.0 %   MCV 89.5 78.0 - 100.0 fl   MCHC 33.9 30.0 - 36.0 g/dL   RDW 14.5 11.5 - 15.5 %   Platelets 301.0 150.0 - 400.0 K/uL   Neutrophils Relative % 63.2 43.0 - 77.0 %   Lymphocytes Relative 27.4 12.0 - 46.0 %   Monocytes Relative 7.4 3.0 - 12.0 %   Eosinophils Relative 1.0 0.0 - 5.0 %   Basophils Relative 1.0 0.0 - 3.0 %   Neutro Abs 5.2 1.4 - 7.7 K/uL   Lymphs Abs 2.2 0.7 - 4.0 K/uL   Monocytes Absolute 0.6 0.1 - 1.0 K/uL   Eosinophils Absolute 0.1 0.0 - 0.7 K/uL   Basophils Absolute 0.1 0.0 - 0.1 K/uL  Basic metabolic panel  Result Value Ref Range   Sodium 137 135 - 145 mEq/L   Potassium 4.3 3.5 - 5.1 mEq/L   Chloride 97 96 - 112 mEq/L   CO2 33 (H) 19 - 32 mEq/L   Glucose, Bld 92 70 - 99 mg/dL   BUN 14 6 - 23 mg/dL   Creatinine, Ser 1.15 0.40 - 1.50 mg/dL   GFR 68.38 >60.00 mL/min   Calcium 10.2 8.4 - 10.5 mg/dL  Hepatic function panel  Result Value Ref Range   Total Bilirubin 0.8 0.2 - 1.2 mg/dL   Bilirubin, Direct 0.1 0.0 - 0.3 mg/dL   Alkaline Phosphatase 51 39 - 117 U/L   AST 15 0 - 37 U/L   ALT 11 0 - 53 U/L   Total Protein 7.4 6.0 - 8.3 g/dL   Albumin 4.9 3.5 - 5.2 g/dL  Sedimentation rate  Result Value Ref Range   Sed  Rate 8 0 - 20 mm/hr     Assessment  and Plan:     ICD-10-CM   1. Weight loss  R63.4 DG Chest 2 View    T4, free    T3, free    TSH    CBC with Differential/Platelet    Basic metabolic panel    Hepatic function panel    Ambulatory Referral Lung Cancer Screening [facility]    Sedimentation rate    CT Chest W Contrast    2. Need for influenza vaccination  Z23 Flu Vaccine QUAD 6+ mos PF IM (Fluarix Quad PF)    3. Heavy cigarette smoker (20-39 per day)  F17.210 DG Chest 2 View    T4, free    T3, free    TSH    CBC with Differential/Platelet    Basic metabolic panel    Hepatic function panel    Ambulatory Referral Lung Cancer Screening [facility]    Sedimentation rate    CT Chest W Contrast    4. Abnormal chest x-ray  R93.89 CT Chest W Contrast    5. Pulmonary nodules  R91.8 CT Chest W Contrast    6. Multiple vessel coronary artery disease  I25.10 Ambulatory referral to Cardiology    7. Chest pain, exertional  R07.9 Ambulatory referral to Cardiology     Total encounter time: 30 minutes. This includes total time spent on the day of encounter.  His labs have returned, and they are grossly unremarkable.  I am going to check a chest x-ray, and I am waiting for the formal report.  Depending on this outcome, I may need to get a CT of the chest with contrast.  If this is normal, then routine lung cancer screening is appropriate in this case as well.  Addendum: 06/05/21 4:41 PM  The patient's laboratories are normal, and he does not have any form of acute or subacute infection.  With reticulonodular opacities at the right middle lung along with 20 pound weight loss without diet change, very cigarette smoker for years, obtained a chest CT with contrast to evaluate for lung neoplasm.  DG Chest 2 View  Result Date: 05/26/2021 CLINICAL DATA:  60 year old male with 20 pound weight loss EXAM: CHEST - 2 VIEW COMPARISON:  05/09/2014 FINDINGS: Cardiomediastinal silhouette  unchanged in size and contour. No evidence of central vascular congestion. No interlobular septal thickening. Reticulonodular opacities involving the right middle lobe. Low lung volumes persist. No pneumothorax or pleural effusion. Coarsened interstitial markings, with no confluent airspace disease. No acute displaced fracture. Degenerative changes of the spine. IMPRESSION: Reticulonodular opacities of the right middle lobe, potentially infection. Followup PA and lateral chest X-ray is recommended in 3-4 weeks following trial of therapy to assure resolution. If there is no current concern for infection, chest CT would be indicated. Electronically Signed   By: Corrie Mckusick D.O.   On: 05/26/2021 13:21     Electronically Signed  By: Owens Loffler, MD On: 06/05/2021 4:41 PM   Addendum: 06/05/21 4:41 PM  Logan Norris's CT of the chest has returned and shows extensive multi-vessel coronary disease.  On further questioning, he does sometimes have some chest pain with exertion that resolves quickly when he rests.  Consult cariology.  No signs of lung cancer.   CT Chest W Contrast  Result Date: 06/04/2021 CLINICAL DATA:  Unintentional weight loss, pulmonary nodule, prostate cancer. Tobacco abuse. EXAM: CT CHEST WITH CONTRAST TECHNIQUE: Multidetector CT imaging of the chest was performed during intravenous contrast administration. CONTRAST:  29mL ISOVUE-300 IOPAMIDOL (ISOVUE-300) INJECTION 61% COMPARISON:  None.  FINDINGS: Cardiovascular: Extensive multi-vessel coronary artery calcification. Global cardiac size within normal limits. No pericardial effusion. Central pulmonary arteries are of normal caliber. The thoracic aorta is of normal caliber. Mild atherosclerotic calcification noted within the descending thoracic aorta. Mediastinum/Nodes: No enlarged mediastinal, hilar, or axillary lymph nodes. Thyroid gland, trachea, and esophagus demonstrate no significant findings. Lungs/Pleura: Mild centrilobular emphysema. No  focal pulmonary nodules or infiltrates. No pneumothorax or pleural effusion. The central airways are widely patent. Upper Abdomen: Probable simple cortical cyst incompletely visualized within the upper pole the right kidney. Mild hepatic steatosis. No acute abnormality. Musculoskeletal: 15 mm circumscribed lytic lesion within the right sixth rib laterally demonstrates no significant matrix and minimal expansion of the rib. No cortical destruction, associated periosteal reaction, or soft tissue mass. Differential considerations are led by nonaggressive lesion such as an enchondroma or unicameral bone cyst. No other lytic or blastic bone lesion. No acute bone abnormality. IMPRESSION: No suspicious or indeterminate pulmonary nodule identified. Mild emphysema. Extensive multi-vessel coronary artery calcification Aortic Atherosclerosis (ICD10-I70.0) and Emphysema (ICD10-J43.9). Electronically Signed   By: Fidela Salisbury M.D.   On: 06/04/2021 23:25     No orders of the defined types were placed in this encounter.  Medications Discontinued During This Encounter  Medication Reason   predniSONE (DELTASONE) 20 MG tablet Completed Course   tadalafil (CIALIS) 5 MG tablet Completed Course   Orders Placed This Encounter  Procedures   DG Chest 2 View   CT Chest W Contrast   Flu Vaccine QUAD 6+ mos PF IM (Fluarix Quad PF)   T4, free   T3, free   TSH   CBC with Differential/Platelet   Basic metabolic panel   Hepatic function panel   Sedimentation rate   Ambulatory Referral Lung Cancer Screening [facility]   Ambulatory referral to Cardiology    Follow-up: No follow-ups on file.  [employer] One speech-to-text software was used for transcription in this dictation.  Possible transcriptional errors can occur using Editor, commissioning.   Signed,  Maud Deed. Chanler Schreiter, MD   Outpatient Encounter Medications as of 05/25/2021  Medication Sig   buPROPion (WELLBUTRIN XL) 150 MG 24 hr tablet TAKE ONE  TABLET BY MOUTH EVERY DAY   citalopram (CELEXA) 40 MG tablet Take 1 tablet by mouth daily.   hydrochlorothiazide (HYDRODIURIL) 25 MG tablet TAKE ONE TABLET BY MOUTH EVERY DAY   ibuprofen (ADVIL,MOTRIN) 200 MG tablet Take 800 mg by mouth every 6 (six) hours as needed for headache or moderate pain.   pantoprazole (PROTONIX) 40 MG tablet Take 1 tablet by mouth daily   rosuvastatin (CRESTOR) 10 MG tablet TAKE ONE TABLET BY MOUTH EVERY DAY   tamsulosin (FLOMAX) 0.4 MG CAPS capsule Take one capsule by mouth daily   clonazePAM (KLONOPIN) 0.5 MG tablet TAKE ONE TABLET BY MOUTH TWICE DAILY AS NEEDED (Patient not taking: Reported on 05/25/2021)   [DISCONTINUED] predniSONE (DELTASONE) 20 MG tablet 2 tabs po for 7 days, then 1 tab po for 7 days   [DISCONTINUED] tadalafil (CIALIS) 5 MG tablet TAKE ONE TABLET BY MOUTH EVERY DAY   No facility-administered encounter medications on file as of 05/25/2021.

## 2021-05-26 ENCOUNTER — Telehealth: Payer: Self-pay | Admitting: Family Medicine

## 2021-05-26 NOTE — Telephone Encounter (Signed)
Pt would like a call back concerning the results of his most recent x-ray taken. Please advise at [phone].

## 2021-05-27 ENCOUNTER — Encounter: Payer: Self-pay | Admitting: Family Medicine

## 2021-05-29 NOTE — Addendum Note (Signed)
Addended by: Owens Loffler on: 05/29/2021 08:27 AM   Modules accepted: Orders

## 2021-05-29 NOTE — Telephone Encounter (Signed)
Dr. Lorelei Pont called and discussed chest x-ray results with patient this morning 05/29/2021 at 8:28 am.

## 2021-06-02 ENCOUNTER — Ambulatory Visit
Admission: RE | Admit: 2021-06-02 | Discharge: 2021-06-02 | Disposition: A | Discharge status: Home / Self Care | Payer: BC Managed Care – PPO | Source: Ambulatory Visit | Attending: Family Medicine | Admitting: Family Medicine

## 2021-06-02 DIAGNOSIS — R634 Abnormal weight loss: Secondary | ICD-10-CM

## 2021-06-02 DIAGNOSIS — R9389 Abnormal findings on diagnostic imaging of other specified body structures: Secondary | ICD-10-CM

## 2021-06-02 DIAGNOSIS — F17210 Nicotine dependence, cigarettes, uncomplicated: Secondary | ICD-10-CM

## 2021-06-02 DIAGNOSIS — R918 Other nonspecific abnormal finding of lung field: Secondary | ICD-10-CM

## 2021-06-02 DIAGNOSIS — J432 Centrilobular emphysema: Secondary | ICD-10-CM | POA: Diagnosis not present

## 2021-06-02 DIAGNOSIS — Z8546 Personal history of malignant neoplasm of prostate: Secondary | ICD-10-CM | POA: Diagnosis not present

## 2021-06-02 DIAGNOSIS — Z720 Tobacco use: Secondary | ICD-10-CM | POA: Diagnosis not present

## 2021-06-02 MED ORDER — IOPAMIDOL (ISOVUE-300) INJECTION 61%
75.0000 mL | Freq: Once | INTRAVENOUS | Status: AC | PRN
  Administered 2021-06-02: 75 mL via INTRAVENOUS

## 2021-06-05 NOTE — Addendum Note (Signed)
Addended by: Owens Loffler on: 06/05/2021 04:42 PM   Modules accepted: Orders

## 2021-06-07 ENCOUNTER — Telehealth: Payer: Self-pay | Admitting: Family Medicine

## 2021-06-07 MED ORDER — CITALOPRAM HYDROBROMIDE 40 MG PO TABS: 40.0000 mg | ORAL_TABLET | Freq: Every day | ORAL | 5 refills | Status: DC

## 2021-06-07 MED ORDER — TAMSULOSIN HCL 0.4 MG PO CAPS: 0.4000 mg | ORAL_CAPSULE | Freq: Every day | ORAL | 5 refills | Status: DC

## 2021-06-07 MED ORDER — PANTOPRAZOLE SODIUM 40 MG PO TBEC: 40.0000 mg | DELAYED_RELEASE_TABLET | Freq: Every day | ORAL | 5 refills | Status: DC

## 2021-06-07 NOTE — Progress Notes (Signed)
Cardiology Office Note:    Date:  06/09/2021   ID:  Logan Norris, DOB October 06, 1960, MRN [id number]  PCP:  Owens Loffler, MD   [facility] Providers Cardiologist:  Candee Furbish, MD     Referring MD: Owens Loffler, MD   History of Present Illness:    Logan Norris is a 60 y.o. male here for the evaluation of exertional chest pain and coronary artery disease at the request Dr. Lorelei Pont.  Logan Norris saw Dr. Lorelei Pont 05/25/2021 and reported unintentional weight loss, about 17 pounds in 18 months. Per Dr. Lillie Fragmin addendum 06/05/2021, chest CT returned and showed extensive multi-vessel coronary disease. After further questioning, Logan Norris reported sometimes having chest pain with exertion that resolves quickly when Logan Norris rests. Logan Norris was then referred to cardiology.   Today, Logan Norris is accompanied by a family member. Yesterday Logan Norris again experienced left chest tightness for a few seconds. Logan Norris believes Logan Norris was sitting at rest when the pain occurred.  Lately Logan Norris has noticed increased shortness of breath with walking up stairs or similar exertion.  For Logan Norris diet Logan Norris continues to work on changes including having more vegetables.  Currently Logan Norris is smoking. Logan Norris has quit intermittently.  Previously aspirin was discontinued due to bleeding issues subsequent to diverticulitis. Logan Norris also has a history of a ruptured stomach ulcer with hematochezia.  Logan Norris denies any palpitations, lightheadedness, headaches, syncope, orthopnea, PND, or lower extremity edema.   Past Medical History:  Diagnosis Date   Anxiety 09/20/2011   Basal cell carcinoma 09/17/2013   Left forehead. Excised: 10/14/2013, margins free   Basal cell carcinoma 09/17/2013   Right upper eyelid   Basal cell carcinoma 05/06/2019   Right mid ear helix. Nodular pattern. Tx: [facility] 06/05/2019   Coronary artery disease involving native coronary artery of native heart with angina pectoris ([place]) 06/09/2021   Depression    GERD (gastroesophageal reflux  disease)    H/O: gastrointestinal hemorrhage    HLD (hyperlipidemia)    HTN (hypertension)    Hx of diverticulitis 01/19/2009   Centricity Description: DIVERTICULITIS, HX OF Qualifier: Diagnosis of  By: Lorelei Pont MD, [place]   Centricity Description: GASTROINTESTINAL HEMORRHAGE, HX OF Qualifier: Diagnosis of  By: Copland MD, Spencer     Hypertriglyceridemia 05/27/2015   Prostate cancer ([place]) 10/23/2010   Qualifier: Diagnosis of  By: Lorelei Pont MD, Spencer     Tobacco abuse     Past Surgical History:  Procedure Laterality Date   INSERTION PROSTATE RADIATION SEED      Current Medications: Current Meds  Medication Sig   buPROPion (WELLBUTRIN XL) 150 MG 24 hr tablet TAKE ONE TABLET BY MOUTH EVERY DAY   citalopram (CELEXA) 40 MG tablet Take 1 tablet (40 mg total) by mouth daily.   hydrochlorothiazide (HYDRODIURIL) 25 MG tablet TAKE ONE TABLET BY MOUTH EVERY DAY   metoprolol tartrate (LOPRESSOR) 100 MG tablet Take 1 tablet (100 mg total) by mouth as directed. Take one tablet 2 hours before your CT scan   pantoprazole (PROTONIX) 40 MG tablet Take 1 tablet (40 mg total) by mouth daily.   rosuvastatin (CRESTOR) 20 MG tablet Take 1 tablet (20 mg total) by mouth daily.   tamsulosin (FLOMAX) 0.4 MG CAPS capsule Take 1 capsule (0.4 mg total) by mouth daily.   varenicline (CHANTIX PAK) 0.5 MG X 11 & 1 MG X 42 tablet Take 0.5 mg by mouth daily for 3 days, then increase to 0.5 mg twice daily for 4 days, then increase to 1 mg twice daily.   [  DISCONTINUED] rosuvastatin (CRESTOR) 10 MG tablet TAKE ONE TABLET BY MOUTH EVERY DAY     Allergies:   Patient has no known allergies.   Social History   Socioeconomic History   Marital status: Married    Spouse name: Not on file   Number of children: Not on file   Years of education: Not on file   Highest education level: Not on file  Occupational History   Occupation: Copy, [place]    Employer: [employer]  Tobacco Use    Smoking status: Former    Packs/day: 1.00    Years: 35.00    Pack years: 35.00    Types: Cigarettes    Quit date: 06/16/2019    Years since quitting: 1.9   Smokeless tobacco: Never  Substance and Sexual Activity   Alcohol use: Not Currently    Comment: rare   Drug use: No   Sexual activity: Not on file  Other Topics Concern   Not on file  Social History Narrative   Not on file   Social Determinants of Health   Financial Resource Strain: Not on file  Food Insecurity: Not on file  Transportation Needs: Not on file  Physical Activity: Not on file  Stress: Not on file  Social Connections: Not on file     Family History: The patient's family history includes Breast cancer in an other family member; Coronary artery disease in Logan Norris father; Heart disease in Logan Norris paternal grandfather; Heart disease (age of onset: 15) in Logan Norris father; Stroke in an other family member.  ROS:   Please see the history of present illness.    (+) Left chest tightness (+) Exertional shortness of breath All other systems reviewed and are negative.  EKGs/Labs/Other Studies Reviewed:    The following studies were reviewed today:  CT Chest 06/02/2021: COMPARISON:  None.   FINDINGS: Cardiovascular: Extensive multi-vessel coronary artery calcification. Global cardiac size within normal limits. No pericardial effusion. Central pulmonary arteries are of normal caliber. The thoracic aorta is of normal caliber. Mild atherosclerotic calcification noted within the descending thoracic aorta.   Mediastinum/Nodes: No enlarged mediastinal, hilar, or axillary lymph nodes. Thyroid gland, trachea, and esophagus demonstrate no significant findings.   Lungs/Pleura: Mild centrilobular emphysema. No focal pulmonary nodules or infiltrates. No pneumothorax or pleural effusion. The central airways are widely patent.   Upper Abdomen: Probable simple cortical cyst incompletely visualized within the upper pole the  right kidney. Mild hepatic steatosis. No acute abnormality.   Musculoskeletal: 15 mm circumscribed lytic lesion within the right sixth rib laterally demonstrates no significant matrix and minimal expansion of the rib. No cortical destruction, associated periosteal reaction, or soft tissue mass. Differential considerations are led by nonaggressive lesion such as an enchondroma or unicameral bone cyst. No other lytic or blastic bone lesion. No acute bone abnormality.   IMPRESSION: No suspicious or indeterminate pulmonary nodule identified.   Mild emphysema.   Extensive multi-vessel coronary artery calcification   Aortic Atherosclerosis (ICD10-I70.0) and Emphysema (ICD10-J43.9).  EKG:  EKG is personally reviewed and interpreted. 06/09/2021: Sinus rhythm. Rate 83 bpm.  Recent Labs: 05/25/2021: ALT 11; BUN 14; Creatinine, Ser 1.15; Hemoglobin 14.9; Platelets 301.0; Potassium 4.3; Sodium 137; TSH 2.79   Recent Lipid Panel    Component Value Date/Time   CHOL 164 11/14/2020 0855   TRIG 165.0 (H) 11/14/2020 0855   HDL 45.30 11/14/2020 0855   CHOLHDL 4 11/14/2020 0855   VLDL 33.0 11/14/2020 0855   LDLCALC 86 11/14/2020 0855  LDLDIRECT 77.0 10/12/2019 0848     Risk Assessment/Calculations:          Physical Exam:    VS:  BP 126/80 (BP Location: Left Arm, Patient Position: Sitting, Cuff Size: Normal)   Pulse 83   Ht 5\' 8"  (1.727 m)   Wt 188 lb (85.3 kg)   SpO2 95%   BMI 28.59 kg/m     Wt Readings from Last 3 Encounters:  06/09/21 188 lb (85.3 kg)  05/25/21 189 lb 2 oz (85.8 kg)  12/19/20 193 lb (87.5 kg)     GEN: Well nourished, well developed in no acute distress HEENT: Normal NECK: No JVD; No carotid bruits LYMPHATICS: No lymphadenopathy CARDIAC: RRR, no murmurs, rubs, gallops RESPIRATORY:  Clear to auscultation without rales, wheezing or rhonchi  ABDOMEN: Soft, non-tender, non-distended MUSCULOSKELETAL:  No edema; No deformity  SKIN: Warm and  dry NEUROLOGIC:  Alert and oriented x 3 PSYCHIATRIC:  Normal affect   ASSESSMENT:    1. Precordial pain   2. Coronary artery disease involving native coronary artery of native heart with angina pectoris ([place])   3. Tobacco abuse   4. High cholesterol   5. Medication management   6. Pre-procedure lab exam   7. Hx of diverticulitis   8. Bleeding ulcer, hx of    PLAN:    In order of problems listed above:  Coronary artery disease involving native coronary artery of native heart with angina pectoris [facility]) Coronary artery disease noted on CT scan.  Multivessel.  We will go ahead and proceed with coronary CT, gated CT with possible FFR analysis.  May eventually need cardiac catheterization if abnormality noted.  Spoke with Logan Norris and Logan Norris wife.  Smoking cessation, increasing Crestor to 20 mg, potential aspirin or Plavix.  Logan Norris has had gastric ulcer as well as diverticular bleeding in the past.  It is been several years.  Logan Norris is taking Protonix.  Logan Norris states that Logan Norris almost bled to death.  Logan Norris was told never to take aspirin again.  I will refer him to gastroenterology for comment on either starting aspirin potentially every other day or Plavix 75 mg given Logan Norris underlying coronary artery disease.  Heart healthy diet.  For smoking cessation, Logan Norris is interested in Chantix.  We will see if this is still available.  Tobacco abuse Discussed cessation at length.  Chantix.  High cholesterol Increasing Crestor to 20 mg high intensity dose.  Repeating lipid panel in 3 months.  ALT.    Follow-up: With APP in 3 months.  Medication Adjustments/Labs and Tests Ordered: Current medicines are reviewed at length with the patient today.  Concerns regarding medicines are outlined above.   Orders Placed This Encounter  Procedures   CT CORONARY MORPH W/CTA COR W/SCORE W/CA W/CM &/OR WO/CM   ALT   Lipid panel   Basic metabolic panel   Ambulatory referral to Gastroenterology   EKG 12-Lead     Meds ordered this  encounter  Medications   rosuvastatin (CRESTOR) 20 MG tablet    Sig: Take 1 tablet (20 mg total) by mouth daily.    Dispense:  90 tablet    Refill:  3   varenicline (CHANTIX PAK) 0.5 MG X 11 & 1 MG X 42 tablet    Sig: Take 0.5 mg by mouth daily for 3 days, then increase to 0.5 mg twice daily for 4 days, then increase to 1 mg twice daily.    Dispense:  53 tablet    Refill:  0   metoprolol tartrate (LOPRESSOR) 100 MG tablet    Sig: Take 1 tablet (100 mg total) by mouth as directed. Take one tablet 2 hours before your CT scan    Dispense:  1 tablet    Refill:  0     Patient Instructions  Medication Instructions:  Please increase your Crestor to 20 mg a day. Continue all other medications as listed.  A prescription for Chantix has been sent into your pharmacy.  *If you need a refill on your cardiac medications before your next appointment, please call your pharmacy*  Lab Work: Please have blood work in 3 months (Lipid/ALT)  If you have labs (blood work) drawn today and your tests are completely normal, you will receive your results only by: [place] (if you have MyChart) OR A paper copy in the mail If you have any lab test that is abnormal or we need to change your treatment, we will call you to review the results.   Testing/Procedures:   Your cardiac CT will be scheduled at:   [facility] [address] [place], [place] [address] [phone]  Please arrive at the [facility] main entrance (entrance A) of [facility] 30 minutes prior to test start time. You can use the FREE valet parking offered at the main entrance (encouraged to control the heart rate for the test) Proceed to the [facility] Radiology Department (first floor) to check-in and test prep.  Please follow these instructions carefully (unless otherwise directed):  Hold all erectile dysfunction medications at least 3 days (72 hrs) prior to test.  On the Night Before the  Test: Be sure to Drink plenty of water. Do not consume any caffeinated/decaffeinated beverages or chocolate 12 hours prior to your test. Do not take any antihistamines 12 hours prior to your test.  On the Day of the Test: Drink plenty of water until 1 hour prior to the test. Do not eat any food 4 hours prior to the test. You may take your regular medications prior to the test.  Take metoprolol (Lopressor) two hours prior to test. HOLD Furosemide/Hydrochlorothiazide morning of the test.      After the Test: Drink plenty of water. After receiving IV contrast, you may experience a mild flushed feeling. This is normal. On occasion, you may experience a mild rash up to 24 hours after the test. This is not dangerous. If this occurs, you can take Benadryl 25 mg and increase your fluid intake. If you experience trouble breathing, this can be serious. If it is severe call 911 IMMEDIATELY. If it is mild, please call our office. If you take any of these medications: Glipizide/Metformin, Avandament, Glucavance, please do not take 48 hours after completing test unless otherwise instructed.  Please allow 2-4 weeks for scheduling of routine cardiac CTs. Some insurance companies require a pre-authorization which may delay scheduling of this test.   For non-scheduling related questions, please contact the cardiac imaging nurse navigator should you have any questions/concerns: Marchia Bond, Cardiac Imaging Nurse Navigator Gordy Clement, Cardiac Imaging Nurse Navigator [place] [facility] Direct Office Dial: [phone]   For scheduling needs, including cancellations and rescheduling, please call [place], [phone].   Follow-Up: At [facility], you and your health needs are our priority.  As part of our continuing mission to provide you with exceptional heart care, we have created designated Provider Care Teams.  These Care Teams include your primary Cardiologist (physician)  and Advanced Practice  Providers (APPs -  Physician Assistants and Nurse Practitioners) who all work together to provide you with the care you need, when you need it.  We recommend signing up for the patient portal called "MyChart".  Sign up information is provided on this After Visit Summary.  MyChart is used to connect with patients for Virtual Visits (Telemedicine).  Patients are able to view lab/test results, encounter notes, upcoming appointments, etc.  Non-urgent messages can be sent to your provider as well.   To learn more about what you can do with MyChart, go to [email or web address].    Your next appointment:   3 month(s)  The format for your next appointment:   In Person  Provider:   Robbie Lis, PA-C, Melina Copa, PA-C, Cecilie Kicks, NP, Ermalinda Barrios, PA-C, Christen Bame, NP, or Richardson Dopp, PA-C     {  Thank you for choosing [facility]!!     I,Mathew Stumpf,acting as a scribe for Candee Furbish, MD.,have documented all relevant documentation on the behalf of Candee Furbish, MD,as directed by  Candee Furbish, MD while in the presence of Candee Furbish, MD.  I, Candee Furbish, MD, have reviewed all documentation for this visit. The documentation on 06/09/21 for the exam, diagnosis, procedures, and orders are all accurate and complete.   Signed, Candee Furbish, MD  06/09/2021 1:13 PM    [facility]

## 2021-06-07 NOTE — Telephone Encounter (Signed)
Refills sent as requested to [facility].

## 2021-06-07 NOTE — Telephone Encounter (Signed)
Logan Norris called stating that she is trying to fill medication for the pt who does mail order but the mail order did not come through this time for him so he is trying to get them filled through the pharmacy and they have not filled the medicine for him before-pantoprazole, citalopram, tamsulosin    pharmacy number [phone]

## 2021-06-08 NOTE — Telephone Encounter (Signed)
Received fax from [facility] requesting PA on Pantoprazole 40 mg.  PA completed on CoverMyMeds and denied.  Per website Aciphex and Protonix were on formulary.  I notified pharmacy of denial vis fax and ask them to attempt to run prescription through as Brand Name Protonix to see if that is covered.

## 2021-06-09 ENCOUNTER — Ambulatory Visit: Payer: BC Managed Care – PPO | Admitting: Cardiology

## 2021-06-09 ENCOUNTER — Encounter: Payer: Self-pay | Admitting: Cardiology

## 2021-06-09 ENCOUNTER — Other Ambulatory Visit: Payer: Self-pay

## 2021-06-09 VITALS — BP 126/80 | HR 83 | Ht 68.0 in | Wt 188.0 lb

## 2021-06-09 DIAGNOSIS — I25119 Atherosclerotic heart disease of native coronary artery with unspecified angina pectoris: Secondary | ICD-10-CM

## 2021-06-09 DIAGNOSIS — I2510 Atherosclerotic heart disease of native coronary artery without angina pectoris: Secondary | ICD-10-CM | POA: Insufficient documentation

## 2021-06-09 DIAGNOSIS — Z720 Tobacco use: Secondary | ICD-10-CM | POA: Diagnosis not present

## 2021-06-09 DIAGNOSIS — R072 Precordial pain: Secondary | ICD-10-CM

## 2021-06-09 DIAGNOSIS — Z01812 Encounter for preprocedural laboratory examination: Secondary | ICD-10-CM

## 2021-06-09 DIAGNOSIS — E7800 Pure hypercholesterolemia, unspecified: Secondary | ICD-10-CM

## 2021-06-09 DIAGNOSIS — K284 Chronic or unspecified gastrojejunal ulcer with hemorrhage: Secondary | ICD-10-CM

## 2021-06-09 DIAGNOSIS — Z79899 Other long term (current) drug therapy: Secondary | ICD-10-CM

## 2021-06-09 DIAGNOSIS — Z8719 Personal history of other diseases of the digestive system: Secondary | ICD-10-CM

## 2021-06-09 HISTORY — DX: Atherosclerotic heart disease of native coronary artery with unspecified angina pectoris: I25.119

## 2021-06-09 MED ORDER — ROSUVASTATIN CALCIUM 20 MG PO TABS: 20.0000 mg | ORAL_TABLET | Freq: Every day | ORAL | 3 refills | Status: DC

## 2021-06-09 MED ORDER — METOPROLOL TARTRATE 100 MG PO TABS: 100.0000 mg | ORAL_TABLET | ORAL | 0 refills | Status: DC

## 2021-06-09 MED ORDER — VARENICLINE TARTRATE 0.5 MG X 11 & 1 MG X 42 PO TBPK: ORAL_TABLET | ORAL | 0 refills | Status: DC

## 2021-06-09 NOTE — Assessment & Plan Note (Signed)
Increasing Crestor to 20 mg high intensity dose.  Repeating lipid panel in 3 months.  ALT.

## 2021-06-09 NOTE — Assessment & Plan Note (Signed)
Discussed cessation at length.  Chantix.

## 2021-06-09 NOTE — Patient Instructions (Signed)
Medication Instructions:  Please increase your Crestor to 20 mg a day. Continue all other medications as listed.  A prescription for Chantix has been sent into your pharmacy.  *If you need a refill on your cardiac medications before your next appointment, please call your pharmacy*  Lab Work: Please have blood work in 3 months (Lipid/ALT)  If you have labs (blood work) drawn today and your tests are completely normal, you will receive your results only by: [place] (if you have MyChart) OR A paper copy in the mail If you have any lab test that is abnormal or we need to change your treatment, we will call you to review the results.   Testing/Procedures:   Your cardiac CT will be scheduled at:   [facility] [address] [place], [place] [address] [phone]  Please arrive at the [facility] main entrance (entrance A) of [facility] 30 minutes prior to test start time. You can use the FREE valet parking offered at the main entrance (encouraged to control the heart rate for the test) Proceed to the [facility] Radiology Department (first floor) to check-in and test prep.  Please follow these instructions carefully (unless otherwise directed):  Hold all erectile dysfunction medications at least 3 days (72 hrs) prior to test.  On the Night Before the Test: Be sure to Drink plenty of water. Do not consume any caffeinated/decaffeinated beverages or chocolate 12 hours prior to your test. Do not take any antihistamines 12 hours prior to your test.  On the Day of the Test: Drink plenty of water until 1 hour prior to the test. Do not eat any food 4 hours prior to the test. You may take your regular medications prior to the test.  Take metoprolol (Lopressor) two hours prior to test. HOLD Furosemide/Hydrochlorothiazide morning of the test.      After the Test: Drink plenty of water. After receiving IV contrast, you may experience a mild flushed  feeling. This is normal. On occasion, you may experience a mild rash up to 24 hours after the test. This is not dangerous. If this occurs, you can take Benadryl 25 mg and increase your fluid intake. If you experience trouble breathing, this can be serious. If it is severe call 911 IMMEDIATELY. If it is mild, please call our office. If you take any of these medications: Glipizide/Metformin, Avandament, Glucavance, please do not take 48 hours after completing test unless otherwise instructed.  Please allow 2-4 weeks for scheduling of routine cardiac CTs. Some insurance companies require a pre-authorization which may delay scheduling of this test.   For non-scheduling related questions, please contact the cardiac imaging nurse navigator should you have any questions/concerns: Marchia Bond, Cardiac Imaging Nurse Navigator Gordy Clement, Cardiac Imaging Nurse Navigator [place] [facility] Direct Office Dial: [phone]   For scheduling needs, including cancellations and rescheduling, please call [place], [phone].   Follow-Up: At [facility], you and your health needs are our priority.  As part of our continuing mission to provide you with exceptional heart care, we have created designated Provider Care Teams.  These Care Teams include your primary Cardiologist (physician) and Advanced Practice Providers (APPs -  Physician Assistants and Nurse Practitioners) who all work together to provide you with the care you need, when you need it.  We recommend signing up for the patient portal called "MyChart".  Sign up information is provided on this After Visit Summary.  MyChart is used to connect  with patients for Virtual Visits (Telemedicine).  Patients are able to view lab/test results, encounter notes, upcoming appointments, etc.  Non-urgent messages can be sent to your provider as well.   To learn more about what you can do with MyChart, go to [email or web address].     Your next appointment:   3 month(s)  The format for your next appointment:   In Person  Provider:   Robbie Lis, PA-C, Melina Copa, PA-C, Cecilie Kicks, NP, Ermalinda Barrios, PA-C, Christen Bame, NP, or Richardson Dopp, PA-C     {  Thank you for choosing [facility]!!

## 2021-06-09 NOTE — Assessment & Plan Note (Signed)
Coronary artery disease noted on CT scan.  Multivessel.  We will go ahead and proceed with coronary CT, gated CT with possible FFR analysis.  May eventually need cardiac catheterization if abnormality noted.  Spoke with he and his wife.  Smoking cessation, increasing Crestor to 20 mg, potential aspirin or Plavix.  He has had gastric ulcer as well as diverticular bleeding in the past.  It is been several years.  He is taking Protonix.  He states that he almost bled to death.  He was told never to take aspirin again.  I will refer him to gastroenterology for comment on either starting aspirin potentially every other day or Plavix 75 mg given his underlying coronary artery disease.  Heart healthy diet.  For smoking cessation, he is interested in Chantix.  We will see if this is still available.

## 2021-06-12 ENCOUNTER — Telehealth (HOSPITAL_COMMUNITY): Payer: Self-pay | Admitting: *Deleted

## 2021-06-12 NOTE — Telephone Encounter (Signed)
Returning patient's phone call.  Patient states that he has appointment with Dr. Watt Climes in January about taking aspirin and Plavix.  He had concerns that that would be too long a wait for appointment.  Wanting some direction from Dr. Marlou Porch.  Gordy Clement, RN Navigator Cardiac Imaging. [phone]

## 2021-06-19 ENCOUNTER — Telehealth (HOSPITAL_COMMUNITY): Payer: Self-pay | Admitting: *Deleted

## 2021-06-19 NOTE — Telephone Encounter (Signed)
Reaching out to patient to offer assistance regarding upcoming cardiac imaging study; pt verbalizes understanding of appt date/time, parking situation and where to check in, pre-test NPO status and medications ordered, and verified current allergies; name and call back number provided for further questions should they arise  Logan Clement RN Navigator Cardiac Imaging [facility] [phone] office [phone] cell  Patient to take 100mg  metoprolol tartrate two hours prior to cardiac CT scan.  He is aware to arrive by 11:30am for his noon scan.

## 2021-06-21 ENCOUNTER — Other Ambulatory Visit: Payer: Self-pay

## 2021-06-21 ENCOUNTER — Encounter (HOSPITAL_COMMUNITY): Payer: Self-pay

## 2021-06-21 ENCOUNTER — Ambulatory Visit (HOSPITAL_COMMUNITY)
Admission: RE | Admit: 2021-06-21 | Discharge: 2021-06-21 | Disposition: A | Discharge status: Home / Self Care | Payer: BC Managed Care – PPO | Source: Ambulatory Visit | Attending: Cardiology | Admitting: Cardiology

## 2021-06-21 DIAGNOSIS — K5730 Diverticulosis of large intestine without perforation or abscess without bleeding: Secondary | ICD-10-CM | POA: Diagnosis not present

## 2021-06-21 DIAGNOSIS — R072 Precordial pain: Secondary | ICD-10-CM | POA: Diagnosis not present

## 2021-06-21 DIAGNOSIS — Z8719 Personal history of other diseases of the digestive system: Secondary | ICD-10-CM | POA: Diagnosis not present

## 2021-06-21 DIAGNOSIS — I2510 Atherosclerotic heart disease of native coronary artery without angina pectoris: Secondary | ICD-10-CM | POA: Diagnosis not present

## 2021-06-21 MED ORDER — SODIUM CHLORIDE 0.9 % IV BOLUS
500.0000 mL | Freq: Once | INTRAVENOUS | Status: AC
  Administered 2021-06-21: 500 mL via INTRAVENOUS

## 2021-06-21 MED ORDER — IOHEXOL 350 MG/ML SOLN
95.0000 mL | Freq: Once | INTRAVENOUS | Status: AC | PRN
  Administered 2021-06-21: 95 mL via INTRAVENOUS

## 2021-06-21 MED ORDER — NITROGLYCERIN 0.4 MG SL SUBL
SUBLINGUAL_TABLET | SUBLINGUAL | Status: AC
  Filled 2021-06-21: qty 2

## 2021-06-21 MED ORDER — NITROGLYCERIN 0.4 MG SL SUBL
0.8000 mg | SUBLINGUAL_TABLET | Freq: Once | SUBLINGUAL | Status: AC
  Administered 2021-06-21: 0.8 mg via SUBLINGUAL

## 2021-06-21 NOTE — Progress Notes (Signed)
CT scan completed. Tolerated well. D/C home ambulatory, awake and alert. In no distress. 

## 2021-06-23 ENCOUNTER — Telehealth: Payer: Self-pay | Admitting: Cardiology

## 2021-06-23 NOTE — Telephone Encounter (Signed)
Pt is returning call for results  [phone]

## 2021-06-23 NOTE — Telephone Encounter (Signed)
   Pt is calling to get CT result  

## 2021-06-23 NOTE — Telephone Encounter (Signed)
Left voicemail message for patient to return call to discuss CT results

## 2021-06-27 ENCOUNTER — Encounter: Payer: Self-pay | Admitting: Cardiology

## 2021-06-27 NOTE — Telephone Encounter (Signed)
1. Coronary calcium score of 393. This was 84th percentile for age-,      sex, and race-matched controls. 2. Normal coronary origin with right dominance.  3. Mild mixed density plaque (25-49%) in the mid LAD. 4. Minimal calcified plaque in the LCX/RCA (<25%).   We increased his Crestor to 20 mg, high intensity dose at clinic.  I reached out to gastroenterology regarding Plavix or aspirin.  I think given his prior GI bleeding episodes and now mild stenosis noted on CT, I am comfortable with utilizing increased Crestor only.  The mild plaque present thankfully would not cause any chest discomfort symptoms.   Candee Furbish, MD  Called and reviewed results and Dr Marlou Porch' orders/recommendations.  All questions answered at the time of the call.  Reviewed upcoming appts for lab and f/u with Richardson Dopp, [place] in February 2023.

## 2021-07-20 ENCOUNTER — Other Ambulatory Visit: Payer: Self-pay | Admitting: Family Medicine

## 2021-08-29 DIAGNOSIS — M25552 Pain in left hip: Secondary | ICD-10-CM | POA: Diagnosis not present

## 2021-08-29 DIAGNOSIS — M791 Myalgia, unspecified site: Secondary | ICD-10-CM | POA: Diagnosis not present

## 2021-08-29 DIAGNOSIS — M5136 Other intervertebral disc degeneration, lumbar region: Secondary | ICD-10-CM | POA: Diagnosis not present

## 2021-09-08 ENCOUNTER — Other Ambulatory Visit: Payer: BC Managed Care – PPO | Admitting: *Deleted

## 2021-09-08 ENCOUNTER — Other Ambulatory Visit: Payer: Self-pay

## 2021-09-08 DIAGNOSIS — Z79899 Other long term (current) drug therapy: Secondary | ICD-10-CM | POA: Diagnosis not present

## 2021-09-08 DIAGNOSIS — E7800 Pure hypercholesterolemia, unspecified: Secondary | ICD-10-CM | POA: Diagnosis not present

## 2021-09-08 LAB — ALT: ALT: 8 IU/L (ref 0–44)

## 2021-09-08 LAB — LIPID PANEL
Chol/HDL Ratio: 3.1 ratio (ref 0.0–5.0)
Cholesterol, Total: 156 mg/dL (ref 100–199)
HDL: 50 mg/dL (ref >39)
LDL Chol Calc (NIH): 91 mg/dL (ref 0–99)
Triglycerides: 77 mg/dL (ref 0–149)
VLDL Cholesterol Cal: 15 mg/dL (ref 5–40)

## 2021-09-14 ENCOUNTER — Encounter: Payer: Self-pay | Admitting: Family Medicine

## 2021-09-14 ENCOUNTER — Ambulatory Visit: Payer: BC Managed Care – PPO | Admitting: Family Medicine

## 2021-09-14 ENCOUNTER — Other Ambulatory Visit: Payer: Self-pay | Admitting: Family Medicine

## 2021-09-14 ENCOUNTER — Other Ambulatory Visit: Payer: Self-pay

## 2021-09-14 VITALS — BP 118/70 | HR 83 | Temp 98.4°F | Ht 68.0 in | Wt 179.6 lb

## 2021-09-14 DIAGNOSIS — Z720 Tobacco use: Secondary | ICD-10-CM

## 2021-09-14 DIAGNOSIS — R072 Precordial pain: Secondary | ICD-10-CM | POA: Insufficient documentation

## 2021-09-14 DIAGNOSIS — R634 Abnormal weight loss: Secondary | ICD-10-CM | POA: Diagnosis not present

## 2021-09-14 DIAGNOSIS — Z87898 Personal history of other specified conditions: Secondary | ICD-10-CM | POA: Diagnosis not present

## 2021-09-14 DIAGNOSIS — Z8546 Personal history of malignant neoplasm of prostate: Secondary | ICD-10-CM

## 2021-09-14 MED ORDER — VARENICLINE TARTRATE 0.5 MG X 11 & 1 MG X 42 PO TBPK: ORAL_TABLET | ORAL | 0 refills | Status: DC

## 2021-09-14 NOTE — Progress Notes (Signed)
Kristianne Albin T. Yuka Lallier, MD, [place] at [facility] [place], [address]  Phone: [phone]   FAX: [phone]  Logan Norris - 61 y.o. male   MRN [id number]   Date of Birth: [birth year]  Date: 09/14/2021   PCP: Owens Loffler, MD   Referral: Owens Loffler, MD  Chief Complaint  Patient presents with   Weight Loss    This visit occurred during the SARS-CoV-2 public health emergency.  Safety protocols were in place, including screening questions prior to the visit, additional usage of staff PPE, and extensive cleaning of exam room while observing appropriate contact time as indicated for disinfecting solutions.   Subjective:   Logan Norris is a 61 y.o. very pleasant male patient with Body mass index is 27.3 kg/m. who presents with the following:  Wt Readings from Last 3 Encounters:  09/15/21 179 lb 12.8 oz (81.6 kg)  09/14/21 179 lb 9 oz (81.4 kg)  06/09/21 188 lb (85.3 kg)    Keeps losing weight.  He is a well-known patient.  He is here with concerns about continued weight loss.  The last time I saw him which was roughly 5 months ago he was 10 pounds heavier.  He has been eating better over the last time.  He has lost 10 pounds.  Prior to this he lost almost 20 pounds unintentionally.  I did do a fairly large-scale work-up in October, and at that point he did have a CT of the chest that did show some coronary calcification and no concern for potential mass lesion.  His blood work was all reassuring as well.  Other health maintenance including colonoscopy is up-to-date.  History is also significant for smoking as well as alcohol abuse in the past.  He has been alcohol free for 3 years.  Not eating sugar and salt.   Originally with 17 pounds of unintentional weight loss.   Review of Systems is noted in the HPI, as appropriate  Objective:   BP 118/70    Pulse 83    Temp 98.4 F (36.9 C) (Temporal)     Ht 5\' 8"  (1.727 m)    Wt 179 lb 9 oz (81.4 kg)    SpO2 96%    BMI 27.30 kg/m   GEN: No acute distress; alert,appropriate. PULM: Breathing comfortably in no respiratory distress PSYCH: Normally interactive.  CV: RRR, no m/g/r  PULM: Normal respiratory rate, no accessory muscle use. No wheezes, crackles or rhonchi  ABD: S, NT, ND, + BS, No rebound, No HSM   Laboratory and Imaging Data:  CT CORONARY MORPH W/CTA COR W/SCORE W/CA W/CM &/OR WO/CM  Addendum Date: 06/21/2021   ADDENDUM REPORT: 06/21/2021 17:33 CLINICAL DATA:  Chest pain EXAM: Cardiac/Coronary CTA TECHNIQUE: A non-contrast, gated CT scan was obtained with axial slices of 3 mm through the heart for calcium scoring. Calcium scoring was performed using the Agatston method. A 120 kV prospective, gated, contrast cardiac scan was obtained. Gantry rotation speed was 250 msecs and collimation was 0.6 mm. Two sublingual nitroglycerin tablets (0.8 mg) were given. The 3D data set was reconstructed in 5% intervals of the 35-75% of the R-R cycle. Diastolic phases were analyzed on a dedicated workstation using MPR, MIP, and VRT modes. The patient received 95 cc of contrast. FINDINGS: Image quality: Excellent. Noise artifact is: Limited. Coronary Arteries:  Normal coronary origin.  Right dominance. Left main: The left main is a large caliber vessel with  a normal take off from the left coronary cusp that bifurcates to form a left anterior descending artery and a left circumflex artery. There is minimal ostial calcified plaque (<25%). Left anterior descending artery: The proximal LAD contains minimal calcified plaque (<25%). The mid LAD contains mild mixed density plaque (25-49%). The distal LAD is patent. The LAD gives off 2 patent diagonal branches. Left circumflex artery: The LCX is non-dominant. There is minimal calcified plaque (<25%) in the proximal segment. The LCX gives off 2 patent obtuse marginal branches. Right coronary artery: The RCA is dominant  with normal take off from the right coronary cusp. There is minimal calcified plaque (<25%). The mid and distal segments are patent. The RCA terminates as a PDA and right posterolateral branch without evidence of plaque or stenosis. Right Atrium: Right atrial size is within normal limits. Right Ventricle: The right ventricular cavity is within normal limits. Left Atrium: Left atrial size is normal in size with no left atrial appendage filling defect. Left Ventricle: The ventricular cavity size is within normal limits. There are no stigmata of prior infarction. There is no abnormal filling defect. Pulmonary arteries: Normal in size without proximal filling defect. Pulmonary veins: Normal pulmonary venous drainage. Pericardium: Normal thickness with no significant effusion or calcium present. Cardiac valves: The aortic valve is trileaflet without significant calcification. The mitral valve is normal structure without significant calcification. Aorta: Normal caliber with no significant disease. Extra-cardiac findings: See attached radiology report for non-cardiac structures. IMPRESSION: 1. Coronary calcium score of 393. This was 84th percentile for age-, sex, and race-matched controls. 2. Normal coronary origin with right dominance. 3. Mild mixed density plaque (25-49%) in the mid LAD. 4. Minimal calcified plaque in the LCX/RCA (<25%). RECOMMENDATIONS: 1. Mild non-obstructive CAD (25-49%). Consider non-atherosclerotic causes of chest pain. Consider preventive therapy and risk factor modification. Eleonore Chiquito, MD Electronically Signed   By: Eleonore Chiquito M.D.   On: 06/21/2021 17:33   Result Date: 06/21/2021 EXAM: OVER-READ INTERPRETATION  CT CHEST The following report is an over-read performed by radiologist Dr. [name] of [facility], [place] on 06/21/2021. This over-read does not include interpretation of cardiac or coronary anatomy or pathology. The coronary calcium score/coronary CTA interpretation  by the cardiologist is attached. COMPARISON:  Chest CT 06/02/2021. FINDINGS: Aortic atherosclerosis. Within the visualized portions of the thorax there are no suspicious appearing pulmonary nodules or masses, there is no acute consolidative airspace disease, no pleural effusions, no pneumothorax and no lymphadenopathy. Visualized portions of the upper abdomen are unremarkable. There are no aggressive appearing lytic or blastic lesions noted in the visualized portions of the skeleton. IMPRESSION: 1.  Aortic Atherosclerosis (ICD10-I70.0). Electronically Signed: By: [name] M.D. On: 06/21/2021 13:34     Assessment and Plan:     ICD-10-CM   1. Weight loss  D17.6 Basic metabolic panel    PSA, Total with Reflex to PSA, Free    CT Abdomen Pelvis W Contrast    TSH    T4, free    T3, free    Hepatic function panel    2. History of prostate cancer  Z85.46 PSA, Total with Reflex to PSA, Free    3. History of alcohol use disorder  Z87.898 TSH    Hepatic function panel    4. Tobacco abuse  Z72.0      Continue weight loss, although recent has been somewhat diet change.  Initially did lose 20 pounds, we do not have a reason for this.  There  is no pulmonary neoplasm seen on chest CT.  Additional screening such as colonoscopy have been normal.  Clinical history is also significant for prior heavy alcohol use.  Obtain a CT of the abdomen and pelvis to evaluate for potential neoplasm, assess for cirrhosis.  History of significant ongoing smoking, Chantix refill.  Meds ordered this encounter  Medications   varenicline (CHANTIX PAK) 0.5 MG X 11 & 1 MG X 42 tablet    Sig: Take 0.5 mg by mouth daily for 3 days, then increase to 0.5 mg twice daily for 4 days, then increase to 1 mg twice daily.    Dispense:  53 tablet    Refill:  0   Medications Discontinued During This Encounter  Medication Reason   varenicline (CHANTIX PAK) 0.5 MG X 11 & 1 MG X 42 tablet Reorder   Orders Placed This  Encounter  Procedures   CT Abdomen Pelvis W Contrast   Basic metabolic panel   PSA, Total with Reflex to PSA, Free   TSH   T4, free   T3, free   Hepatic function panel    Follow-up: No follow-ups on file.  Dr[employer] One speech-to-text software was used for transcription in this dictation.  Possible transcriptional errors can occur using Editor, commissioning.   Signed,  Maud Deed. Chalee Hirota, MD   Outpatient Encounter Medications as of 09/14/2021  Medication Sig   buPROPion (WELLBUTRIN XL) 150 MG 24 hr tablet TAKE ONE TABLET BY MOUTH EVERY DAY (Patient not taking: Reported on 09/15/2021)   citalopram (CELEXA) 40 MG tablet Take 1 tablet (40 mg total) by mouth daily.   hydrochlorothiazide (HYDRODIURIL) 25 MG tablet TAKE ONE TABLET BY MOUTH EVERY DAY   metoprolol tartrate (LOPRESSOR) 100 MG tablet Take 1 tablet (100 mg total) by mouth as directed. Take one tablet 2 hours before your CT scan   pantoprazole (PROTONIX) 40 MG tablet Take 1 tablet (40 mg total) by mouth daily.   rosuvastatin (CRESTOR) 20 MG tablet Take 1 tablet (20 mg total) by mouth daily.   tamsulosin (FLOMAX) 0.4 MG CAPS capsule Take 1 capsule (0.4 mg total) by mouth daily.   [DISCONTINUED] varenicline (CHANTIX PAK) 0.5 MG X 11 & 1 MG X 42 tablet Take 0.5 mg by mouth daily for 3 days, then increase to 0.5 mg twice daily for 4 days, then increase to 1 mg twice daily.   varenicline (CHANTIX PAK) 0.5 MG X 11 & 1 MG X 42 tablet Take 0.5 mg by mouth daily for 3 days, then increase to 0.5 mg twice daily for 4 days, then increase to 1 mg twice daily. (Patient not taking: Reported on 09/15/2021)   No facility-administered encounter medications on file as of 09/14/2021.

## 2021-09-14 NOTE — Progress Notes (Addendum)
Cardiology Office Note:    Date:  09/15/2021   ID:  Logan Norris, DOB 02-Jan-1961, MRN [id number]  PCP:  Owens Loffler, MD  [facility] Providers Cardiologist:  Candee Furbish, MD  Referring MD: Owens Loffler, MD   Chief Complaint:  Follow-up for CAD   Patient Profile: Coronary artery disease  Chest CT 10/22: Extensive multivessel CAD CCTA 11/22: Mild nonobstructive CAD Basal cell CA GERD Hyperlipidemia Hypertension Prostate CA +Cigs  hx of GI bleed; gastric ulcer Aortic atherosclerosis  CCTA 06/21/2021 CAC score 393 (84th percentile) Mild mixed density plaque in the mid LAD (25-49) Minimal calcific plaque in the LCx/RCA (<25)  Echocardiogram 12/11/2011 Mild LVH, EF 55-65, normal wall motion, G1 DD, normal PASP  History of Present Illness:   Logan Norris is a 61 y.o. male with the above problem list.  He was last seen by Dr. Marlou Porch 11/22 to evaluate chest pain.  Coronary CTA did demonstrate mild nonobstructive disease.  He returns for follow-up. He is here alone.  He saw Dr. Watt Climes in Nov 2022.  I reviewed his notes and the patient was cleared to start ASA from a GI standpoint.  The patient has not had chest pain, shortness of breath, syncope.          Past Medical History:  Diagnosis Date   Anxiety 09/20/2011   Basal cell carcinoma 09/17/2013   Left forehead. Excised: 10/14/2013, margins free   Basal cell carcinoma 09/17/2013   Right upper eyelid   Basal cell carcinoma 05/06/2019   Right mid ear helix. Nodular pattern. Tx: [facility] 06/05/2019   Coronary artery disease involving native coronary artery of native heart with angina pectoris ([place]) 06/09/2021   Depression    GERD (gastroesophageal reflux disease)    H/O: gastrointestinal hemorrhage    HLD (hyperlipidemia)    HTN (hypertension)    Hx of diverticulitis 01/19/2009   Centricity Description: DIVERTICULITIS, HX OF Qualifier: Diagnosis of  By: Lorelei Pont MD, [place]   Centricity Description:  GASTROINTESTINAL HEMORRHAGE, HX OF Qualifier: Diagnosis of  By: Copland MD, [place]     Hypertriglyceridemia 05/27/2015   Prostate cancer ([place]) 10/23/2010   Qualifier: Diagnosis of  By: Lorelei Pont MD, Frederico Hamman     Tobacco abuse    Current Medications: Current Meds  Medication Sig   aspirin EC 81 MG tablet Take 1 tablet (81 mg total) by mouth daily. Swallow whole.   citalopram (CELEXA) 40 MG tablet Take 1 tablet (40 mg total) by mouth daily.   ezetimibe (ZETIA) 10 MG tablet Take 1 tablet (10 mg total) by mouth daily.   hydrochlorothiazide (HYDRODIURIL) 25 MG tablet TAKE ONE TABLET BY MOUTH EVERY DAY   metoprolol tartrate (LOPRESSOR) 100 MG tablet Take 1 tablet (100 mg total) by mouth as directed. Take one tablet 2 hours before your CT scan   pantoprazole (PROTONIX) 40 MG tablet Take 1 tablet (40 mg total) by mouth daily.   rosuvastatin (CRESTOR) 20 MG tablet Take 1 tablet (20 mg total) by mouth daily.   tamsulosin (FLOMAX) 0.4 MG CAPS capsule Take 1 capsule (0.4 mg total) by mouth daily.    Allergies:   Patient has no known allergies.   Social History   Tobacco Use   Smoking status: Former    Packs/day: 1.00    Years: 35.00    Pack years: 35.00    Types: Cigarettes    Quit date: 06/16/2019    Years since quitting: 2.2   Smokeless tobacco: Never  Substance Use Topics  Alcohol use: Not Currently    Comment: rare   Drug use: No    Family Hx: The patient's family history includes Breast cancer in an other family member; Coronary artery disease in his father; Heart disease in his paternal grandfather; Heart disease (age of onset: 76) in his father; Stroke in an other family member.  ROS See HPI  EKGs/Labs/Other Test Reviewed:    EKG:  EKG is not ordered today.  The ekg ordered today demonstrates n/a  Recent Labs: 05/25/2021: Hemoglobin 14.9; Platelets 301.0 09/14/2021: ALT 10; BUN 15; Creatinine, Ser 1.23; Potassium 4.3; Sodium 137; TSH 1.30   Recent Lipid Panel Recent Labs     11/14/20 0855 09/08/21 0740  CHOL 164 156  TRIG 165.0* 77  HDL 45.30 50  VLDL 33.0  --   LDLCALC 86 91     Risk Assessment/Calculations:         Physical Exam:    VS:  BP 124/72    Pulse 68    Ht 5\' 8"  (1.727 m)    Wt 179 lb 12.8 oz (81.6 kg)    SpO2 98%    BMI 27.34 kg/m     Wt Readings from Last 3 Encounters:  09/15/21 179 lb 12.8 oz (81.6 kg)  09/14/21 179 lb 9 oz (81.4 kg)  06/09/21 188 lb (85.3 kg)    Physical Exam      ASSESSMENT & PLAN:   CAD (coronary artery disease) Coronary CTA demonstrated mild nonobstructive CAD.  We discussed the importance of risk factor modification.  He had labs recently done by Dr. Marlou Porch that demonstrated LDL >70.  Ezetimibe 10 mg daily has been recommended.  As noted, reviewed recent notes from GI.  The patient can start taking aspirin 81 mg daily.  He is currently doing well without anginal symptoms.  Follow-up in 1 year or sooner if needed.  Essential hypertension Blood pressure is controlled on current dose of hydrochlorothiazide.  Pure hypercholesterolemia As noted, recent LDL >70.  He remains on rosuvastatin 20 mg daily.  Ezetimibe 10 mg daily has been added to his medical regimen.  He will have follow-up labs again in 3 months.           Dispo:  Return in about 1 year (around 09/15/2022) for Routine follow up in 1 year with Dr. Marlou Porch. .   Medication Adjustments/Labs and Tests Ordered: Current medicines are reviewed at length with the patient today.  Concerns regarding medicines are outlined above.  Tests Ordered: Orders Placed This Encounter  Procedures   Hepatic function panel   Lipid panel   Medication Changes: Meds ordered this encounter  Medications   ezetimibe (ZETIA) 10 MG tablet    Sig: Take 1 tablet (10 mg total) by mouth daily.    Dispense:  90 tablet    Refill:  3   aspirin EC 81 MG tablet    Sig: Take 1 tablet (81 mg total) by mouth daily. Swallow whole.    Dispense:  90 tablet    Refill:  3    Signed, Richardson Dopp, PA-C  09/15/2021 12:15 PM    [facility] [place], [place], [place]  [address] Phone: [phone]; Fax: [phone]

## 2021-09-15 ENCOUNTER — Encounter: Payer: Self-pay | Admitting: Physician Assistant

## 2021-09-15 ENCOUNTER — Ambulatory Visit: Payer: BC Managed Care – PPO | Admitting: Physician Assistant

## 2021-09-15 VITALS — BP 124/72 | HR 68 | Ht 68.0 in | Wt 179.8 lb

## 2021-09-15 DIAGNOSIS — E7800 Pure hypercholesterolemia, unspecified: Secondary | ICD-10-CM

## 2021-09-15 DIAGNOSIS — I2510 Atherosclerotic heart disease of native coronary artery without angina pectoris: Secondary | ICD-10-CM | POA: Diagnosis not present

## 2021-09-15 DIAGNOSIS — I10 Essential (primary) hypertension: Secondary | ICD-10-CM

## 2021-09-15 DIAGNOSIS — R072 Precordial pain: Secondary | ICD-10-CM

## 2021-09-15 DIAGNOSIS — E781 Pure hyperglyceridemia: Secondary | ICD-10-CM

## 2021-09-15 LAB — HEPATIC FUNCTION PANEL
ALT: 10 U/L (ref 0–53)
AST: 14 U/L (ref 0–37)
Albumin: 4.6 g/dL (ref 3.5–5.2)
Alkaline Phosphatase: 38 U/L — ABNORMAL LOW (ref 39–117)
Bilirubin, Direct: 0.2 mg/dL (ref 0.0–0.3)
Total Bilirubin: 1 mg/dL (ref 0.2–1.2)
Total Protein: 7.1 g/dL (ref 6.0–8.3)

## 2021-09-15 LAB — BASIC METABOLIC PANEL
BUN: 15 mg/dL (ref 6–23)
CO2: 34 mEq/L — ABNORMAL HIGH (ref 19–32)
Calcium: 9.9 mg/dL (ref 8.4–10.5)
Chloride: 97 mEq/L (ref 96–112)
Creatinine, Ser: 1.23 mg/dL (ref 0.40–1.50)
GFR: 62.94 mL/min (ref >60.00)
Glucose, Bld: 95 mg/dL (ref 70–99)
Potassium: 4.3 mEq/L (ref 3.5–5.1)
Sodium: 137 mEq/L (ref 135–145)

## 2021-09-15 LAB — T3, FREE: T3, Free: 2.7 pg/mL (ref 2.3–4.2)

## 2021-09-15 LAB — T4, FREE: Free T4: 0.99 ng/dL (ref 0.60–1.60)

## 2021-09-15 LAB — TSH: TSH: 1.3 u[IU]/mL (ref 0.35–5.50)

## 2021-09-15 LAB — PSA, TOTAL WITH REFLEX TO PSA, FREE: PSA, Total: <0.1 ng/mL (ref <=4.0)

## 2021-09-15 MED ORDER — EZETIMIBE 10 MG PO TABS: 10.0000 mg | ORAL_TABLET | Freq: Every day | ORAL | 3 refills | Status: DC

## 2021-09-15 MED ORDER — ASPIRIN EC 81 MG PO TBEC: 81.0000 mg | DELAYED_RELEASE_TABLET | Freq: Every day | ORAL | 3 refills | Status: DC

## 2021-09-15 NOTE — Assessment & Plan Note (Signed)
Coronary CTA demonstrated mild nonobstructive CAD.  We discussed the importance of risk factor modification.  He had labs recently done by Dr. Marlou Porch that demonstrated LDL >70.  Ezetimibe 10 mg daily has been recommended.  As noted, reviewed recent notes from GI.  The patient can start taking aspirin 81 mg daily.  He is currently doing well without anginal symptoms.  Follow-up in 1 year or sooner if needed.

## 2021-09-15 NOTE — Assessment & Plan Note (Signed)
As noted, recent LDL >70.  He remains on rosuvastatin 20 mg daily.  Ezetimibe 10 mg daily has been added to his medical regimen.  He will have follow-up labs again in 3 months.

## 2021-09-15 NOTE — Assessment & Plan Note (Signed)
Blood pressure is controlled on current dose of hydrochlorothiazide.

## 2021-09-15 NOTE — Patient Instructions (Signed)
Medication Instructions:   START Zetia one ( 1 ) tablet by mouth ( 10 mg ) daily.   START Asprin one (1) tablet by mouth ( 81 mg) daily OTC.  *If you need a refill on your cardiac medications before your next appointment, please call your pharmacy*   Lab Work:  Your physician recommends that you return for a FASTING lipid profile/lft. Wednesday, May 17. You can come in on the day of your appointment anytime between 7:30-4:30 fasting from midnight the night before.      If you have labs (blood work) drawn today and your tests are completely normal, you will receive your results only by: [place] (if you have MyChart) OR A paper copy in the mail If you have any lab test that is abnormal or we need to change your treatment, we will call you to review the results.   Testing/Procedures:  None ordered.    Follow-Up: At [facility], you and your health needs are our priority.  As part of our continuing mission to provide you with exceptional heart care, we have created designated Provider Care Teams.  These Care Teams include your primary Cardiologist (physician) and Advanced Practice Providers (APPs -  Physician Assistants and Nurse Practitioners) who all work together to provide you with the care you need, when you need it.  We recommend signing up for the patient portal called "MyChart".  Sign up information is provided on this After Visit Summary.  MyChart is used to connect with patients for Virtual Visits (Telemedicine).  Patients are able to view lab/test results, encounter notes, upcoming appointments, etc.  Non-urgent messages can be sent to your provider as well.   To learn more about what you can do with MyChart, go to [email or web address].    Your next appointment:   1 year(s)  The format for your next appointment:   In Person  Provider:   Candee Furbish, MD     Other Instructions  Your physician wants you to follow-up in: 1 year with Dr.Skains.  You will  receive a reminder letter in the mail two months in advance. If you don't receive a letter, please call our office to schedule the follow-up appointment.

## 2021-09-19 ENCOUNTER — Encounter: Payer: Self-pay | Admitting: *Deleted

## 2021-10-10 ENCOUNTER — Ambulatory Visit
Admission: RE | Admit: 2021-10-10 | Discharge: 2021-10-10 | Disposition: A | Discharge status: Home / Self Care | Payer: BC Managed Care – PPO | Source: Ambulatory Visit | Attending: Family Medicine | Admitting: Family Medicine

## 2021-10-10 ENCOUNTER — Inpatient Hospital Stay: Admission: RE | Admit: 2021-10-10 | Payer: BC Managed Care – PPO | Source: Ambulatory Visit

## 2021-10-10 DIAGNOSIS — R634 Abnormal weight loss: Secondary | ICD-10-CM

## 2021-10-10 DIAGNOSIS — I700 Atherosclerosis of aorta: Secondary | ICD-10-CM | POA: Diagnosis not present

## 2021-10-10 DIAGNOSIS — N281 Cyst of kidney, acquired: Secondary | ICD-10-CM | POA: Diagnosis not present

## 2021-10-10 MED ORDER — IOPAMIDOL (ISOVUE-300) INJECTION 61%
100.0000 mL | Freq: Once | INTRAVENOUS | Status: AC | PRN
  Administered 2021-10-10: 100 mL via INTRAVENOUS

## 2021-12-20 ENCOUNTER — Other Ambulatory Visit: Payer: BC Managed Care – PPO

## 2021-12-20 DIAGNOSIS — I2510 Atherosclerotic heart disease of native coronary artery without angina pectoris: Secondary | ICD-10-CM | POA: Diagnosis not present

## 2021-12-20 DIAGNOSIS — E7800 Pure hypercholesterolemia, unspecified: Secondary | ICD-10-CM

## 2021-12-20 LAB — LIPID PANEL
Chol/HDL Ratio: 2.4 ratio (ref 0.0–5.0)
Cholesterol, Total: 114 mg/dL (ref 100–199)
HDL: 48 mg/dL (ref >39)
LDL Chol Calc (NIH): 49 mg/dL (ref 0–99)
Triglycerides: 86 mg/dL (ref 0–149)
VLDL Cholesterol Cal: 17 mg/dL (ref 5–40)

## 2021-12-20 LAB — HEPATIC FUNCTION PANEL
ALT: 11 IU/L (ref 0–44)
AST: 18 IU/L (ref 0–40)
Albumin: 4.6 g/dL (ref 3.8–4.8)
Alkaline Phosphatase: 47 IU/L (ref 44–121)
Bilirubin Total: 0.7 mg/dL (ref 0.0–1.2)
Bilirubin, Direct: 0.22 mg/dL (ref 0.00–0.40)
Total Protein: 6.9 g/dL (ref 6.0–8.5)

## 2021-12-21 ENCOUNTER — Other Ambulatory Visit: Payer: Self-pay | Admitting: Family Medicine

## 2021-12-21 ENCOUNTER — Encounter: Payer: Self-pay | Admitting: *Deleted

## 2022-01-24 DIAGNOSIS — L308 Other specified dermatitis: Secondary | ICD-10-CM | POA: Diagnosis not present

## 2022-01-24 DIAGNOSIS — L84 Corns and callosities: Secondary | ICD-10-CM | POA: Diagnosis not present

## 2022-01-26 ENCOUNTER — Other Ambulatory Visit: Payer: Self-pay | Admitting: Family Medicine

## 2022-01-26 NOTE — Telephone Encounter (Signed)
Please schedule CPE with fasting labs prior with Dr. Copland.  

## 2022-01-29 NOTE — Telephone Encounter (Signed)
LVM to schedule an appointment.

## 2022-02-09 DIAGNOSIS — M25559 Pain in unspecified hip: Secondary | ICD-10-CM | POA: Diagnosis not present

## 2022-02-09 DIAGNOSIS — M25551 Pain in right hip: Secondary | ICD-10-CM | POA: Diagnosis not present

## 2022-02-09 DIAGNOSIS — M25552 Pain in left hip: Secondary | ICD-10-CM | POA: Diagnosis not present

## 2022-02-12 ENCOUNTER — Telehealth: Payer: Self-pay | Admitting: Family Medicine

## 2022-02-12 NOTE — Telephone Encounter (Signed)
Called patient to schedule, no answer, but left voicemail.

## 2022-02-12 NOTE — Telephone Encounter (Signed)
Please schedule CPE with fasting labs prior for Dr. Copland.  

## 2022-03-10 ENCOUNTER — Other Ambulatory Visit: Payer: Self-pay | Admitting: Cardiology

## 2022-03-10 DIAGNOSIS — H109 Unspecified conjunctivitis: Secondary | ICD-10-CM | POA: Diagnosis not present

## 2022-03-29 ENCOUNTER — Other Ambulatory Visit: Payer: Self-pay | Admitting: Family Medicine

## 2022-03-29 NOTE — Telephone Encounter (Signed)
LVM for patient to call and schedule this appointment

## 2022-03-29 NOTE — Telephone Encounter (Signed)
Please call and schedule CPE with fasting labs prior for Dr. Lorelei Pont.  Once scheduled please send back to me to  refill his medication to get him to his appointment.

## 2022-04-16 ENCOUNTER — Other Ambulatory Visit (INDEPENDENT_AMBULATORY_CARE_PROVIDER_SITE_OTHER): Payer: BC Managed Care – PPO

## 2022-04-16 DIAGNOSIS — Z8546 Personal history of malignant neoplasm of prostate: Secondary | ICD-10-CM

## 2022-04-16 DIAGNOSIS — Z79899 Other long term (current) drug therapy: Secondary | ICD-10-CM

## 2022-04-16 DIAGNOSIS — Z131 Encounter for screening for diabetes mellitus: Secondary | ICD-10-CM | POA: Diagnosis not present

## 2022-04-16 DIAGNOSIS — R5383 Other fatigue: Secondary | ICD-10-CM

## 2022-04-16 LAB — BASIC METABOLIC PANEL
BUN: 17 mg/dL (ref 6–23)
CO2: 33 mEq/L — ABNORMAL HIGH (ref 19–32)
Calcium: 9.4 mg/dL (ref 8.4–10.5)
Chloride: 100 mEq/L (ref 96–112)
Creatinine, Ser: 1.2 mg/dL (ref 0.40–1.50)
GFR: 64.56 mL/min (ref >60.00)
Glucose, Bld: 89 mg/dL (ref 70–99)
Potassium: 4.5 mEq/L (ref 3.5–5.1)
Sodium: 138 mEq/L (ref 135–145)

## 2022-04-16 LAB — CBC WITH DIFFERENTIAL/PLATELET
Basophils Absolute: 0.1 10*3/uL (ref 0.0–0.1)
Basophils Relative: 1.1 % (ref 0.0–3.0)
Eosinophils Absolute: 0.1 10*3/uL (ref 0.0–0.7)
Eosinophils Relative: 1.4 % (ref 0.0–5.0)
HCT: 41.8 % (ref 39.0–52.0)
Hemoglobin: 14 g/dL (ref 13.0–17.0)
Lymphocytes Relative: 30.2 % (ref 12.0–46.0)
Lymphs Abs: 1.7 10*3/uL (ref 0.7–4.0)
MCHC: 33.6 g/dL (ref 30.0–36.0)
MCV: 90.5 fl (ref 78.0–100.0)
Monocytes Absolute: 0.5 10*3/uL (ref 0.1–1.0)
Monocytes Relative: 9.5 % (ref 3.0–12.0)
Neutro Abs: 3.2 10*3/uL (ref 1.4–7.7)
Neutrophils Relative %: 57.8 % (ref 43.0–77.0)
Platelets: 258 10*3/uL (ref 150.0–400.0)
RBC: 4.61 Mil/uL (ref 4.22–5.81)
RDW: 14.8 % (ref 11.5–15.5)
WBC: 5.6 10*3/uL (ref 4.0–10.5)

## 2022-04-16 LAB — HEPATIC FUNCTION PANEL
ALT: 12 U/L (ref 0–53)
AST: 14 U/L (ref 0–37)
Albumin: 4.2 g/dL (ref 3.5–5.2)
Alkaline Phosphatase: 46 U/L (ref 39–117)
Bilirubin, Direct: 0.1 mg/dL (ref 0.0–0.3)
Total Bilirubin: 0.5 mg/dL (ref 0.2–1.2)
Total Protein: 6.5 g/dL (ref 6.0–8.3)

## 2022-04-16 LAB — TSH: TSH: 1.44 u[IU]/mL (ref 0.35–5.50)

## 2022-04-16 LAB — HEMOGLOBIN A1C: Hgb A1c MFr Bld: 6.6 % — ABNORMAL HIGH (ref 4.6–6.5)

## 2022-04-18 LAB — PSA, TOTAL WITH REFLEX TO PSA, FREE: PSA, Total: <0.1 ng/mL (ref <=4.0)

## 2022-04-22 NOTE — Progress Notes (Unsigned)
Logan Laube T. Barret Esquivel, MD, [place], [address]  Phone: [phone]  FAX: [phone]  Logan Norris - 61 y.o. male  MRN [id number]  Date of Birth: 28-Feb-1961  Date: 04/23/2022  PCP: Owens Loffler, MD  Referral: Owens Loffler, MD  No chief complaint on file.  Patient Care Team: Owens Loffler, MD as PCP - General Marlou Porch Thana Farr, MD as PCP - Cardiology (Cardiology) Subjective:   Logan Norris is a 61 y.o. pleasant patient who presents with the following:  Preventative Health Maintenance Visit:  Health Maintenance Summary Reviewed and updated, unless pt declines services.  Tobacco History Reviewed. Alcohol: No concerns, no excessive use Exercise Habits: Some activity, rec at least 30 mins 5 times a week STD concerns: no risk or activity to increase risk Drug Use: None  Shingrix New Covid booster Flu  He is here to follow-up about complete health maintenance exam.  I did saw him roughly 7 months ago when he was had some concerns about weight loss.  That point we did do a CT of the chest abdomen and pelvis, and these were all normal.  Labs have been normal.    Health Maintenance  Topic Date Due   Zoster Vaccines- Shingrix (1 of 2) Never done   COVID-19 Vaccine (3 - Pfizer risk series) 06/09/2020   INFLUENZA VACCINE  03/06/2022   COLONOSCOPY (Pts 45-65yr Insurance coverage will need to be confirmed)  01/18/2025   TETANUS/TDAP  12/28/2026   Hepatitis C Screening  Completed   HIV Screening  Completed   HPV VACCINES  Aged Out   Immunization History  Administered Date(s) Administered   Influenza Whole 06/06/2010   Influenza, Quadrivalent, Recombinant, Inj, Pf 05/18/2018   Influenza,inj,Quad PF,6+ Mos 06/02/2019, 05/25/2021   Influenza-Unspecified 05/07/2017   PFIZER(Purple Top)SARS-COV-2 Vaccination 04/03/2020, 05/12/2020   Td 08/06/2005   Tdap 12/27/2016    Patient Active Problem List   Diagnosis Date Noted   CAD (coronary artery disease) 06/09/2021    Priority: High   [place]state cancer ([place] 10/23/[place]    Priority: High   Precordial pain 09/14/2021   [place]ression, major, recurrent, moderate ([place] 05/13/2017   GAD (generalized anxiety disorder) 05/13/2017   Hypertriglyceridemia 05/27/2015   Bleeding ulcer, hx of 09/30/2012   Tobacco abuse    Essential hypertension 02/09/2010   Pure hypercholesterolemia 01/19/2009   GERD 01/19/2009   Hx of diverticulitis 01/19/2009    Past Medical History:  Diagnosis Date   Anxiety 09/20/2011   Basal cell carcinoma 09/17/2013   Left forehead. Excised: 10/14/2013, margins free   Basal cell carcinoma 09/17/2013   Right upper eyelid   Basal cell carcinoma 05/06/2019   Right mid ear helix. [facility]   Coronary artery disease involving native coronary artery of native heart with angina pectoris ([place] 06/09/2021   Depression    GERD (gastroesophageal reflux disease)    H/O: gastrointestinal hemorrhage    HLD (hyperlipidemia)    HTN (hypertension)    Hx of diverticulitis 01/19/2009   Centricity Description: DIVERTICULITIS, HX OF Qualifier: Diagnosis of  By: CLorelei PontMD, Winry Egnew   Centricity Description: GASTROINTESTINAL HEMORRHAGE, HX OF Qualifier: Diagnosis of  By: Bellarose Burtt MD, Dael Howland     Hypertriglyceridemia 05/27/2015   [place]state cancer ([place] 10/23/2010   Qualifier: Diagnosis of  By: CLorelei PontMD, Anjulie Dipierro     Tobacco abuse     Past Surgical History:  Procedure Laterality Date   INSERTION  PROSTATE RADIATION SEED      Family History  Problem Relation Age of Onset   Coronary artery disease Father        CABG x2   Heart disease Father 49       CABG   Stroke Other        Grandparent   Breast cancer Other        Family history -1st degree relative <50   Heart disease Paternal Grandfather     Social History   Social History Narrative   Not on file    Past Medical  History, Surgical History, Social History, Family History, Problem List, Medications, and Allergies have been reviewed and updated if relevant.  Review of Systems: Pertinent positives are listed above.  Otherwise, a full 14 point review of systems has been done in full and it is negative except where it is noted positive.  Objective:   There were no vitals taken for this visit. Ideal Body Weight:    Ideal Body Weight:   No results found.    10/19/2019    8:06 AM 09/01/2018    8:25 AM 06/24/2017   12:43 PM 05/13/2017   11:47 AM  Depression screen PHQ 2/9  Decreased Interest 0 0 1 3  Down, Depressed, Hopeless 0 0 0 3  PHQ - 2 Score 0 0 1 6  Altered sleeping   3 3  Tired, decreased energy   0 2  Change in appetite   0 2  Feeling bad or failure about yourself    0 2  Trouble concentrating   0 1  Moving slowly or fidgety/restless   0 0  Suicidal thoughts   0 1  PHQ-9 Score   4 17  Difficult doing work/chores   Somewhat difficult Very difficult     GEN: well developed, well nourished, no acute distress Eyes: conjunctiva and lids normal, PERRLA, EOMI ENT: TM clear, nares clear, oral exam WNL Neck: supple, no lymphadenopathy, no thyromegaly, no JVD Pulm: clear to auscultation and percussion, respiratory effort normal CV: regular rate and rhythm, S1-S2, no murmur, rub or gallop, no bruits, peripheral pulses normal and symmetric, no cyanosis, clubbing, edema or varicosities GI: soft, non-tender; no hepatosplenomegaly, masses; active bowel sounds all quadrants GU: deferred Lymph: no cervical, axillary or inguinal adenopathy MSK: gait normal, muscle tone and strength WNL, no joint swelling, effusions, discoloration, crepitus  SKIN: clear, good turgor, color WNL, no rashes, lesions, or ulcerations Neuro: normal mental status, normal strength, sensation, and motion Psych: alert; oriented to person, place and time, normally interactive and not anxious or depressed in appearance.  All  labs reviewed with patient. Results for orders placed or performed in visit on 04/16/22  Hepatic function panel  Result Value Ref Range   Total Bilirubin 0.5 0.2 - 1.2 mg/dL   Bilirubin, Direct 0.1 0.0 - 0.3 mg/dL   Alkaline Phosphatase 46 39 - 117 U/L   AST 14 0 - 37 U/L   ALT 12 0 - 53 U/L   Total Protein 6.5 6.0 - 8.3 g/dL   Albumin 4.2 3.5 - 5.2 g/dL  CBC with Differential/Platelet  Result Value Ref Range   WBC 5.6 4.0 - 10.5 K/uL   RBC 4.61 4.22 - 5.81 Mil/uL   Hemoglobin 14.0 13.0 - 17.0 g/dL   HCT 41.8 39.0 - 52.0 %   MCV 90.5 78.0 - 100.0 fl   MCHC 33.6 30.0 - 36.0 g/dL   RDW 14.8 11.5 - 15.5 %  Platelets 258.0 150.0 - 400.0 K/uL   Neutrophils Relative % 57.8 43.0 - 77.0 %   Lymphocytes Relative 30.2 12.0 - 46.0 %   Monocytes Relative 9.5 3.0 - 12.0 %   Eosinophils Relative 1.4 0.0 - 5.0 %   Basophils Relative 1.1 0.0 - 3.0 %   Neutro Abs 3.2 1.4 - 7.7 K/uL   Lymphs Abs 1.7 0.7 - 4.0 K/uL   Monocytes Absolute 0.5 0.1 - 1.0 K/uL   Eosinophils Absolute 0.1 0.0 - 0.7 K/uL   Basophils Absolute 0.1 0.0 - 0.1 K/uL  Basic metabolic panel  Result Value Ref Range   Sodium 138 135 - 145 mEq/L   Potassium 4.5 3.5 - 5.1 mEq/L   Chloride 100 96 - 112 mEq/L   CO2 33 (H) 19 - 32 mEq/L   Glucose, Bld 89 70 - 99 mg/dL   BUN 17 6 - 23 mg/dL   Creatinine, Ser 1.20 0.40 - 1.50 mg/dL   GFR 64.56 >60.00 mL/min   Calcium 9.4 8.4 - 10.5 mg/dL  TSH  Result Value Ref Range   TSH 1.44 0.35 - 5.50 uIU/mL  PSA, Total with Reflex to PSA, Free  Result Value Ref Range   PSA, Total <0.1 < OR = 4.0 ng/mL  Hemoglobin A1c  Result Value Ref Range   Hgb A1c MFr Bld 6.6 (H) 4.6 - 6.5 %    Assessment and Plan:     ICD-10-CM   1. Healthcare maintenance  Z00.00       Health Maintenance Exam: The patient's preventative maintenance and recommended screening tests for an annual wellness exam were reviewed in full today. Brought up to date unless services declined.  Counselled on the  importance of diet, exercise, and its role in overall health and mortality. The patient's FH and SH was reviewed, including their home life, tobacco status, and drug and alcohol status.  Follow-up in 1 year for physical exam or additional follow-up below.  Disposition: No follow-ups on file.  No orders of the defined types were placed in this encounter.  There are no discontinued medications. No orders of the defined types were placed in this encounter.   Signed,  Maud Deed. Thomos Domine, MD   Allergies as of 04/23/2022   No Known Allergies      Medication List        Accurate as of April 22, 2022  4:50 PM. If you have any questions, ask your nurse or doctor.          aspirin EC 81 MG tablet Take 1 tablet (81 mg total) by mouth daily. Swallow whole.   buPROPion 150 MG 24 hr tablet Commonly known as: WELLBUTRIN XL TAKE ONE TABLET BY MOUTH EVERY DAY   citalopram 40 MG tablet Commonly known as: CELEXA TAKE ONE TABLET BY MOUTH DAILY   ezetimibe 10 MG tablet Commonly known as: ZETIA Take 1 tablet (10 mg total) by mouth daily.   hydrochlorothiazide 25 MG tablet Commonly known as: HYDRODIURIL TAKE ONE TABLET BY MOUTH EVERY DAY   metoprolol tartrate 100 MG tablet Commonly known as: LOPRESSOR Take 1 tablet (100 mg total) by mouth as directed. Take one tablet 2 hours before your CT scan   pantoprazole 40 MG tablet Commonly known as: PROTONIX TAKE ONE TABLET BY MOUTH DAILY   rosuvastatin 20 MG tablet Commonly known as: CRESTOR Take 1 tablet (20 mg total) by mouth daily.   tamsulosin 0.4 MG Caps capsule Commonly known as: FLOMAX TAKE ONE CAPSULE BY  MOUTH DAILY   varenicline 0.5 MG X 11 & 1 MG X 42 tablet Commonly known as: CHANTIX PAK Take 0.5 mg by mouth daily for 3 days, then increase to 0.5 mg twice daily for 4 days, then increase to 1 mg twice daily.

## 2022-04-23 ENCOUNTER — Ambulatory Visit (INDEPENDENT_AMBULATORY_CARE_PROVIDER_SITE_OTHER): Payer: BC Managed Care – PPO | Admitting: Family Medicine

## 2022-04-23 ENCOUNTER — Encounter: Payer: Self-pay | Admitting: Family Medicine

## 2022-04-23 VITALS — BP 120/72 | HR 82 | Temp 98.5°F | Ht 67.75 in | Wt 189.4 lb

## 2022-04-23 DIAGNOSIS — E119 Type 2 diabetes mellitus without complications: Secondary | ICD-10-CM

## 2022-04-23 DIAGNOSIS — Z23 Encounter for immunization: Secondary | ICD-10-CM

## 2022-04-23 DIAGNOSIS — Z0000 Encounter for general adult medical examination without abnormal findings: Secondary | ICD-10-CM | POA: Diagnosis not present

## 2022-04-23 NOTE — Addendum Note (Signed)
Addended by: [name] on: 04/23/2022 09:09 AM   Modules accepted: Orders

## 2022-05-04 ENCOUNTER — Ambulatory Visit
Admission: RE | Admit: 2022-05-04 | Discharge: 2022-05-04 | Disposition: A | Discharge status: Home / Self Care | Payer: Worker's Compensation | Source: Ambulatory Visit | Attending: Physician Assistant | Admitting: Physician Assistant

## 2022-05-04 ENCOUNTER — Telehealth: Payer: Self-pay | Admitting: Family Medicine

## 2022-05-04 ENCOUNTER — Other Ambulatory Visit: Payer: Self-pay | Admitting: Physician Assistant

## 2022-05-04 DIAGNOSIS — T1490XA Injury, unspecified, initial encounter: Secondary | ICD-10-CM

## 2022-05-04 NOTE — Telephone Encounter (Signed)
Per access nurse note pt disposition was to go to ED and per note pt agreed to go to ED. Sending note to Dr Lorelei Pont and [name] CMA.   [place] Day - Client TELEPHONE ADVICE RECORD AccessNurse Patient Name: Logan Norris Gender: Male DOB: 1960/10/05 Age: 61 Y 11 M 7 D Return Phone Number: [phone] (Primary) Address: City/ State/ Zip: [place]  [address] Client [facility] - Client Client Site [place] - Day Provider Copland, Frederico Hamman - MD Contact Type Call Who Is Calling Patient / Member / Family / Caregiver Call Type Triage / Clinical Relationship To Patient Self Return Phone Number [phone] (Primary) Chief Complaint CHEST PAIN - pain, pressure, heaviness or tightness Reason for Call Symptomatic / Request for [place] was transferred from the office for a patient that had a fall and he may broke his ribs, cannot bend over, chest pain. Translation No Nurse Assessment Nurse: [place], RN, Estill Bamberg Date/Time (Eastern Time): 05/04/2022 1:13:54 PM Confirm and document reason for call. If symptomatic, describe symptoms. ---caller states he fell about 15 mins ago. fell on right side with arm above his head and landed on right side. cannot bend over due to severe pain on the right side on ribs. Does the patient have any new or worsening symptoms? ---Yes Will a triage be completed? ---Yes Related visit to physician within the last 2 weeks? ---No Does the PT have any chronic conditions? (i.e. diabetes, asthma, this includes High risk factors for pregnancy, etc.) ---Yes List chronic conditions. ---arthritis Is this a behavioral health or substance abuse call? ---No Guidelines Guideline Title Affirmed Question Affirmed Notes Nurse Date/Time Eilene Ghazi Time) Chest Injury SEVERE chest pain Humfleet, RN, Estill Bamberg 05/04/2022 1:15:45 PM Disp. Time Eilene Ghazi Time)  Disposition Final User 05/04/2022 1:13:10 PM Send to Urgent Clarnce Flock 05/04/2022 1:18:07 PM Go to ED Now Yes Humfleet, RN, Estill Bamberg PLEASE NOTE: All timestamps contained within this report are represented as [place] Standard Time. CONFIDENTIALTY NOTICE: This fax transmission is intended only for the addressee. It contains information that is legally privileged, confidential or otherwise protected from use or disclosure. If you are not the intended recipient, you are strictly prohibited from reviewing, disclosing, copying using or disseminating any of this information or taking any action in reliance on or regarding this information. If you have received this fax in error, please notify us immediately by telephone so that we can arrange for its return to [place]. Phone: [phone], Toll-Free: [phone], Fax: [phone] Page: 2 of 2 Call Id: [id number] Final Disposition 05/04/2022 1:18:07 PM Go to ED Now Yes Humfleet, RN, Shelly Coss Disagree/Comply Comply Caller Understands Yes PreDisposition InappropriateToAsk Care Advice Given Per Guideline CARE ADVICE given per Chest Injury (Adult) guideline. GO TO ED NOW: * You need to be seen in the Emergency Department. * Go to the ED at ___________ [place] now. Drive carefully. * Patient should not delay going to the emergency department. Referrals [place]

## 2022-05-04 NOTE — Telephone Encounter (Signed)
Patient called and stated he fell at work 10 minutes ago and he think he broke his ribs. Patient was sent to access nurse.

## 2022-05-11 ENCOUNTER — Telehealth: Payer: Self-pay | Admitting: Family Medicine

## 2022-05-11 NOTE — Telephone Encounter (Signed)
Please call  I would get OTC Pepcid AC and take twice a day

## 2022-05-11 NOTE — Telephone Encounter (Signed)
I think he is requesting something other than Meloxicam.

## 2022-05-11 NOTE — Telephone Encounter (Signed)
Pt called stating on 05/04/22, he was involved in a accident & broke a rib. Pt stated he was put on meloxicam & he see's the meds aren't good for the stomach. Pt stated he's been having some issues with the meds & is requesting if Copland can prescribe something for pt? Call back # [phone]

## 2022-05-12 MED ORDER — CELECOXIB 200 MG PO CAPS: 200.0000 mg | ORAL_CAPSULE | Freq: Every day | ORAL | 2 refills | Status: DC

## 2022-05-12 NOTE — Telephone Encounter (Signed)
OK, I sent him in some Celebrex.

## 2022-05-14 NOTE — Telephone Encounter (Signed)
Left message for Logan Norris that Dr. Lorelei Pont sent him in a Rx for Celebrex to replace the meloxicam with.  I ask that he call me back if he has any questions.

## 2022-05-17 ENCOUNTER — Other Ambulatory Visit: Payer: Self-pay | Admitting: Family Medicine

## 2022-05-17 DIAGNOSIS — M5136 Other intervertebral disc degeneration, lumbar region: Secondary | ICD-10-CM | POA: Diagnosis not present

## 2022-05-17 DIAGNOSIS — M5416 Radiculopathy, lumbar region: Secondary | ICD-10-CM | POA: Diagnosis not present

## 2022-05-17 DIAGNOSIS — M25559 Pain in unspecified hip: Secondary | ICD-10-CM | POA: Diagnosis not present

## 2022-05-23 NOTE — Progress Notes (Unsigned)
    Carisha Kantor T. Geroldine Esquivias, MD, [facility] [place], [address]  Phone: [phone]  FAX: [phone]  Logan Norris - 61 y.o. male  MRN [id number]  Date of Birth: 10-Oct-1960  Date: 05/24/2022  PCP: Owens Loffler, MD  Referral: Owens Loffler, MD  No chief complaint on file.  Subjective:   Logan Norris is a 61 y.o. very pleasant male patient with There is no height or weight on file to calculate BMI. who presents with the following:    Review of Systems is noted in the HPI, as appropriate  Objective:   There were no vitals taken for this visit.  GEN: No acute distress; alert,appropriate. PULM: Breathing comfortably in no respiratory distress PSYCH: Normally interactive.   Laboratory and Imaging Data:  Assessment and Plan:   ***

## 2022-05-24 ENCOUNTER — Ambulatory Visit: Payer: BC Managed Care – PPO

## 2022-05-24 ENCOUNTER — Ambulatory Visit (INDEPENDENT_AMBULATORY_CARE_PROVIDER_SITE_OTHER)
Admission: RE | Admit: 2022-05-24 | Discharge: 2022-05-24 | Disposition: A | Discharge status: Home / Self Care | Payer: BC Managed Care – PPO | Source: Ambulatory Visit | Attending: Family Medicine | Admitting: Family Medicine

## 2022-05-24 ENCOUNTER — Ambulatory Visit: Payer: BC Managed Care – PPO | Admitting: Family Medicine

## 2022-05-24 ENCOUNTER — Encounter: Payer: Self-pay | Admitting: Family Medicine

## 2022-05-24 ENCOUNTER — Ambulatory Visit
Admission: RE | Admit: 2022-05-24 | Discharge: 2022-05-24 | Disposition: A | Discharge status: Home / Self Care | Payer: BC Managed Care – PPO | Source: Ambulatory Visit | Attending: Family Medicine | Admitting: Family Medicine

## 2022-05-24 VITALS — BP 110/62 | HR 76 | Temp 98.4°F | Ht 67.75 in | Wt 193.0 lb

## 2022-05-24 DIAGNOSIS — M542 Cervicalgia: Secondary | ICD-10-CM

## 2022-05-24 DIAGNOSIS — M4692 Unspecified inflammatory spondylopathy, cervical region: Secondary | ICD-10-CM | POA: Diagnosis not present

## 2022-05-24 DIAGNOSIS — M47812 Spondylosis without myelopathy or radiculopathy, cervical region: Secondary | ICD-10-CM | POA: Diagnosis not present

## 2022-05-24 MED ORDER — PREDNISONE 20 MG PO TABS: ORAL_TABLET | ORAL | 0 refills | Status: DC

## 2022-06-06 ENCOUNTER — Other Ambulatory Visit: Payer: Self-pay | Admitting: Physician Assistant

## 2022-07-15 ENCOUNTER — Ambulatory Visit: Payer: Self-pay

## 2022-07-16 ENCOUNTER — Ambulatory Visit
Admission: RE | Admit: 2022-07-16 | Discharge: 2022-07-16 | Disposition: A | Discharge status: Home / Self Care | Payer: BC Managed Care – PPO | Source: Ambulatory Visit | Attending: Family Medicine | Admitting: Family Medicine

## 2022-07-16 ENCOUNTER — Other Ambulatory Visit: Payer: Self-pay

## 2022-07-16 VITALS — BP 118/76 | HR 68 | Temp 99.5°F | Resp 16 | Ht 68.0 in | Wt 189.0 lb

## 2022-07-16 DIAGNOSIS — Z20828 Contact with and (suspected) exposure to other viral communicable diseases: Secondary | ICD-10-CM | POA: Diagnosis not present

## 2022-07-16 DIAGNOSIS — J09X2 Influenza due to identified novel influenza A virus with other respiratory manifestations: Secondary | ICD-10-CM

## 2022-07-16 LAB — POCT INFLUENZA A/B
Influenza A, POC: POSITIVE — AB
Influenza B, POC: NEGATIVE

## 2022-07-16 MED ORDER — OSELTAMIVIR PHOSPHATE 75 MG PO CAPS: 75.0000 mg | ORAL_CAPSULE | Freq: Two times a day (BID) | ORAL | 0 refills | Status: DC

## 2022-07-16 MED ORDER — PROMETHAZINE-DM 6.25-15 MG/5ML PO SYRP: 5.0000 mL | ORAL_SOLUTION | Freq: Two times a day (BID) | ORAL | 0 refills | Status: DC | PRN

## 2022-07-16 MED ORDER — BENZONATATE 200 MG PO CAPS: 200.0000 mg | ORAL_CAPSULE | Freq: Three times a day (TID) | ORAL | 0 refills | Status: AC | PRN

## 2022-07-16 NOTE — Discharge Instructions (Addendum)
Advised patient to take medication as directed with food to completion.  Advised patient may take Tessalon Perles daily or as needed for cough.  Advised may take promethazine DM at night for cough due to sedation.  Advised patient not to take cough medications together.  Encouraged patient to increase daily water intake to 64 ounces per day while taking these medications.  Advised if symptoms worsen and/or unresolved please follow-up with PCP or here for further evaluation.

## 2022-07-16 NOTE — ED Provider Notes (Signed)
[name]    CSN: [id number] Arrival date & time: 07/16/22  0940      History   Chief Complaint Chief Complaint  Patient presents with   Fever    Fever, body aches, headache, fatigue-daughter and her boyfriend both have the flu - Entered by patient   Generalized Body Aches    HPI Logan Norris is a 61 y.o. male.   HPI Pleasant 61 year old male presents with bodyaches, headache, fever, exposure to flu x 3 days and slight cough.  PMH significant for BCC, depression, and HTN.  Past Medical History:  Diagnosis Date   Anxiety 09/20/2011   Basal cell carcinoma 09/17/2013   Left forehead. Excised: 10/14/2013, margins free   Basal cell carcinoma 09/17/2013   Right upper eyelid   Basal cell carcinoma 05/06/2019   Right mid ear helix. Nodular pattern. Tx: [facility] 06/05/2019   Coronary artery disease involving native coronary artery of native heart with angina pectoris ([place]) 06/09/2021   Depression    GERD (gastroesophageal reflux disease)    H/O: gastrointestinal hemorrhage    HLD (hyperlipidemia)    HTN (hypertension)    Hx of diverticulitis 01/19/2009   Centricity Description: DIVERTICULITIS, HX OF Qualifier: Diagnosis of  By: Lorelei Pont MD, [place]   Centricity Description: GASTROINTESTINAL HEMORRHAGE, HX OF Qualifier: Diagnosis of  By: Copland MD, Spencer     Hypertriglyceridemia 05/27/2015   Prostate cancer ([place]) 10/23/2010   Qualifier: Diagnosis of  By: Lorelei Pont MD, [place]     Tobacco abuse     Patient Active Problem List   Diagnosis Date Noted   Precordial pain 09/14/2021   CAD (coronary artery disease) 06/09/2021   Depression, major, recurrent, moderate ([employer]) 05/13/2017   GAD (generalized anxiety disorder) 05/13/2017   Hypertriglyceridemia 05/27/2015   Bleeding ulcer, hx of 09/30/2012   Tobacco abuse    Prostate cancer ([place]) 10/23/2010   Essential hypertension 02/09/2010   Pure hypercholesterolemia 01/19/2009   GERD 01/19/2009   Hx of diverticulitis  01/19/2009    Past Surgical History:  Procedure Laterality Date   INSERTION PROSTATE RADIATION SEED         Home Medications    Prior to Admission medications   Medication Sig Start Date End Date Taking? Authorizing Provider  benzonatate (TESSALON) 200 MG capsule Take 1 capsule (200 mg total) by mouth 3 (three) times daily as needed for up to 7 days. 07/16/22 07/23/22 Yes Eliezer Lofts, FNP  oseltamivir (TAMIFLU) 75 MG capsule Take 1 capsule (75 mg total) by mouth every 12 (twelve) hours. 07/16/22  Yes Eliezer Lofts, FNP  promethazine-dextromethorphan (PROMETHAZINE-DM) 6.25-15 MG/5ML syrup Take 5 mLs by mouth 2 (two) times daily as needed for cough. 07/16/22  Yes Eliezer Lofts, FNP  buPROPion (WELLBUTRIN XL) 150 MG 24 hr tablet TAKE ONE TABLET BY MOUTH EVERY DAY 01/26/22   Copland, Frederico Hamman, MD  citalopram (CELEXA) 40 MG tablet TAKE ONE TABLET BY MOUTH DAILY 03/29/22   Copland, Frederico Hamman, MD  ezetimibe (ZETIA) 10 MG tablet Take 1 tablet (10 mg total) by mouth daily. 09/15/21   Richardson Dopp T, PA-C  hydrochlorothiazide (HYDRODIURIL) 25 MG tablet TAKE ONE TABLET BY MOUTH EVERY DAY 05/17/22   Copland, Frederico Hamman, MD  pantoprazole (PROTONIX) 40 MG tablet TAKE ONE TABLET BY MOUTH DAILY 03/29/22   Copland, Frederico Hamman, MD  rosuvastatin (CRESTOR) 20 MG tablet Take 1 tablet (20 mg total) by mouth daily. 03/13/22   Jerline Pain, MD  tamsulosin (FLOMAX) 0.4 MG CAPS capsule TAKE ONE CAPSULE BY MOUTH  DAILY 03/29/22   Copland, Frederico Hamman, MD    Family History Family History  Problem Relation Age of Onset   Coronary artery disease Father        CABG x2   Heart disease Father 38       CABG   Stroke Other        Grandparent   Breast cancer Other        Family history -1st degree relative <50   Heart disease Paternal Grandfather     Social History Social History   Tobacco Use   Smoking status: Every Day    Packs/day: 1.00    Years: 35.00    Total pack years: 35.00    Types: Cigarettes    Last  attempt to quit: 06/16/2019    Years since quitting: 3.0   Smokeless tobacco: Never   Tobacco comments:    Currently on the patch to quit  Vaping Use   Vaping Use: Never used  Substance Use Topics   Alcohol use: Not Currently    Comment: rare   Drug use: No     Allergies   Patient has no known allergies.   Review of Systems Review of Systems  Constitutional:  Positive for chills and fever.  Musculoskeletal:  Positive for myalgias.  All other systems reviewed and are negative.    Physical Exam Triage Vital Signs ED Triage Vitals [07/16/22 1022]  Enc Vitals Group     BP 118/76     Pulse Rate 68     Resp 16     Temp 99.5 F (37.5 C)     Temp Source Oral     SpO2 98 %     Weight 189 lb (85.7 kg)     Height '5\' 8"'$  (1.727 m)     Head Circumference      Peak Flow      Pain Score 2     Pain Loc      Pain Edu?      Excl. in [place]?    No data found.  Updated Vital Signs BP 118/76 (BP Location: Left Arm)   Pulse 68   Temp 99.5 F (37.5 C) (Oral)   Resp 16   Ht '5\' 8"'$  (1.727 m)   Wt 189 lb (85.7 kg)   SpO2 98%   BMI 28.74 kg/m      Physical Exam Vitals and nursing note reviewed.  Constitutional:      Appearance: Normal appearance. He is normal weight. He is ill-appearing.  HENT:     Head: Normocephalic and atraumatic.     Right Ear: Tympanic membrane, ear canal and external ear normal.     Left Ear: Tympanic membrane, ear canal and external ear normal.     Mouth/Throat:     Mouth: Mucous membranes are moist.     Pharynx: Oropharynx is clear.  Eyes:     Extraocular Movements: Extraocular movements intact.     Conjunctiva/sclera: Conjunctivae normal.     Pupils: Pupils are equal, round, and reactive to light.  Cardiovascular:     Rate and Rhythm: Normal rate and regular rhythm.     Pulses: Normal pulses.     Heart sounds: Normal heart sounds.  Pulmonary:     Effort: Pulmonary effort is normal.     Breath sounds: Normal breath sounds. No wheezing,  rhonchi or rales.  Musculoskeletal:        General: Normal range of motion.     Cervical back: Normal  range of motion and neck supple.  Skin:    General: Skin is warm and dry.  Neurological:     General: No focal deficit present.     Mental Status: He is alert and oriented to person, place, and time.      UC Treatments / Results  Labs (all labs ordered are listed, but only abnormal results are displayed) Labs Reviewed  POCT INFLUENZA A/B - Abnormal; Notable for the following components:      Result Value   Influenza A, POC Positive (*)    All other components within normal limits    EKG   Radiology No results found.  Procedures Procedures (including critical care time)  Medications Ordered in UC Medications - No data to display  Initial Impression / Assessment and Plan / UC Course  I have reviewed the triage vital signs and the nursing notes.  Pertinent labs & imaging results that were available during my care of the patient were reviewed by me and considered in my medical decision making (see chart for details).     MDM: 1.  Influenza A-Rx'd Tamiflu; 2.  Cough-Rx'd Tessalon, Promethazine DM. Advised patient to take medication as directed with food to completion.  Advised patient may take Tessalon Perles daily or as needed for cough.  Advised may take promethazine DM at night for cough due to sedation.  Advised patient not to take cough medications together.  Encouraged patient to increase daily water intake to 64 ounces per day while taking these medications.  Advised if symptoms worsen and/or unresolved please follow-up with PCP or here for further evaluation.  Work note provided to patient prior to discharge patient discharged home, hemodynamically stable. Final Clinical Impressions(s) / UC Diagnoses   Final diagnoses:  Exposure to the flu  Influenza due to identified novel influenza A virus with other respiratory manifestations     Discharge Instructions       Advised patient to take medication as directed with food to completion.  Advised patient may take Tessalon Perles daily or as needed for cough.  Advised may take promethazine DM at night for cough due to sedation.  Advised patient not to take cough medications together.  Encouraged patient to increase daily water intake to 64 ounces per day while taking these medications.  Advised if symptoms worsen and/or unresolved please follow-up with PCP or here for further evaluation.     ED Prescriptions     Medication Sig Dispense Auth. Provider   oseltamivir (TAMIFLU) 75 MG capsule Take 1 capsule (75 mg total) by mouth every 12 (twelve) hours. 10 capsule Eliezer Lofts, FNP   benzonatate (TESSALON) 200 MG capsule Take 1 capsule (200 mg total) by mouth 3 (three) times daily as needed for up to 7 days. 40 capsule Eliezer Lofts, FNP   promethazine-dextromethorphan (PROMETHAZINE-DM) 6.25-15 MG/5ML syrup Take 5 mLs by mouth 2 (two) times daily as needed for cough. 118 mL Eliezer Lofts, FNP      PDMP not reviewed this encounter.   Eliezer Lofts, [place] 07/16/22 1153

## 2022-07-16 NOTE — ED Triage Notes (Signed)
Body aches, headache, fever, exposure to flu, x 3 days slight cough, no flu shot

## 2022-08-23 ENCOUNTER — Telehealth: Payer: Self-pay | Admitting: Family Medicine

## 2022-08-23 ENCOUNTER — Ambulatory Visit (INDEPENDENT_AMBULATORY_CARE_PROVIDER_SITE_OTHER): Payer: BC Managed Care – PPO

## 2022-08-23 DIAGNOSIS — Z23 Encounter for immunization: Secondary | ICD-10-CM

## 2022-08-23 MED ORDER — BUPROPION HCL ER (XL) 150 MG PO TB24: 150.0000 mg | ORAL_TABLET | Freq: Every day | ORAL | 1 refills | Status: DC

## 2022-08-23 NOTE — Telephone Encounter (Signed)
Refills sent as requested

## 2022-08-23 NOTE — Telephone Encounter (Signed)
Prescription Request  08/23/2022  Is this a "Controlled Substance" medicine? No  LOV: 05/24/2022  What is the name of the medication or equipment? buPROPion (WELLBUTRIN XL) 150 MG 24 hr tablet   Have you contacted your pharmacy to request a refill? No   Which pharmacy would you like this sent to? [place], [place] - [address] [place] [address] Phone: [phone] Fax: [phone]   Patient notified that their request is being sent to the clinical staff for review and that they should receive a response within 2 business days.   Please advise at Mobile [phone] (mobile)

## 2022-08-23 NOTE — Progress Notes (Signed)
Per orders of Dr. Eliezer Lofts, injection of Shingrix given by [facility] in right deltoid. Patient tolerated injection well.

## 2022-09-25 ENCOUNTER — Other Ambulatory Visit: Payer: Self-pay | Admitting: Family Medicine

## 2022-10-06 ENCOUNTER — Other Ambulatory Visit: Payer: Self-pay | Admitting: Physician Assistant

## 2022-10-09 ENCOUNTER — Telehealth: Payer: Self-pay | Admitting: Family Medicine

## 2022-10-09 NOTE — Telephone Encounter (Signed)
Left message for Lanny Hurst to return my call.

## 2022-10-09 NOTE — Telephone Encounter (Signed)
Patient called in and would like a call to discuss his A1C. He can be reached at [phone]. Thank you!

## 2022-10-10 NOTE — Telephone Encounter (Signed)
Left message for Logan Norris to return my call.

## 2022-10-11 ENCOUNTER — Other Ambulatory Visit: Payer: Self-pay | Admitting: Family Medicine

## 2022-10-11 NOTE — Telephone Encounter (Signed)
Left message for Logan Norris to return my call.  I also advised that if it would be easier for him, he could also sent [place] a MyChart with his questions or concerns about his A1c.

## 2022-10-30 ENCOUNTER — Other Ambulatory Visit: Payer: Self-pay | Admitting: Cardiology

## 2022-11-06 ENCOUNTER — Telehealth: Payer: Self-pay | Admitting: Cardiology

## 2022-11-06 ENCOUNTER — Other Ambulatory Visit: Payer: Self-pay | Admitting: Cardiology

## 2022-11-06 MED ORDER — EZETIMIBE 10 MG PO TABS: 10.0000 mg | ORAL_TABLET | Freq: Every day | ORAL | 0 refills | Status: DC

## 2022-11-06 NOTE — Telephone Encounter (Signed)
Pt's medication has already been sent to pt's pharmacy as requested. Confirmation received.  

## 2022-11-06 NOTE — Telephone Encounter (Signed)
 *  STAT* If patient is at the pharmacy, call can be transferred to refill team.   1. Which medications need to be refilled? (please list name of each medication and dose if known) ezetimibe (ZETIA) 10 MG tablet   2. Which pharmacy/location (including street and city if local pharmacy) is medication to be sent to?   [place], [place] - [address]    3. Do they need a 30 day or 90 day supply? 24   Pt is scheduled to see Dr. Marlou Porch 11/28/22

## 2022-11-07 ENCOUNTER — Other Ambulatory Visit: Payer: Self-pay | Admitting: Family Medicine

## 2022-11-07 NOTE — Telephone Encounter (Signed)
Received refill request today for Celebrex 200 mg.  This is no longer on his medication list.  Refill note states The original prescription was discontinued on 07/16/2022 by Rene Kocher, CMA. Renewing this prescription may not be appropriate.  I have left Logan Norris a message asking him to call and confirm if he is still taking the Celebrex.

## 2022-11-21 ENCOUNTER — Other Ambulatory Visit: Payer: Self-pay | Admitting: Family Medicine

## 2022-11-28 ENCOUNTER — Encounter: Payer: Self-pay | Admitting: Cardiology

## 2022-11-28 ENCOUNTER — Ambulatory Visit: Payer: BC Managed Care – PPO | Attending: Cardiology | Admitting: Cardiology

## 2022-11-28 VITALS — BP 128/70 | HR 81 | Ht 68.0 in | Wt 199.8 lb

## 2022-11-28 DIAGNOSIS — I10 Essential (primary) hypertension: Secondary | ICD-10-CM | POA: Diagnosis not present

## 2022-11-28 DIAGNOSIS — I2510 Atherosclerotic heart disease of native coronary artery without angina pectoris: Secondary | ICD-10-CM | POA: Diagnosis not present

## 2022-11-28 DIAGNOSIS — E7800 Pure hypercholesterolemia, unspecified: Secondary | ICD-10-CM

## 2022-11-28 DIAGNOSIS — Z720 Tobacco use: Secondary | ICD-10-CM | POA: Diagnosis not present

## 2022-11-28 NOTE — Patient Instructions (Signed)
Medication Instructions:  Your physician recommends that you continue on your current medications as directed. Please refer to the Current Medication list given to you today.  *If you need a refill on your cardiac medications before your next appointment, please call your pharmacy*   Follow-Up: At [facility], you and your health needs are our priority.  As part of our continuing mission to provide you with exceptional heart care, we have created designated Provider Care Teams.  These Care Teams include your primary Cardiologist (physician) and Advanced Practice Providers (APPs -  Physician Assistants and Nurse Practitioners) who all work together to provide you with the care you need, when you need it.   Your next appointment:   1 year(s)  Provider:   Mark Skains, MD     

## 2022-11-28 NOTE — Progress Notes (Signed)
Cardiology Office Note:    Date:  11/28/2022   ID:  Logan Norris, DOB 03/16/1961, MRN [id number]  PCP:  Hannah Beat, MD   [facility] Providers Cardiologist:  Donato Schultz, MD     Referring MD: Hannah Beat, MD   History of Present Illness:    Logan Norris is a 62 y.o. male here for the follow-up of coronary artery disease moderate nonobstructive disease seen on coronary CT scan in 2022.  Subsequent high intensity statin with Zetia 10 mg added for LDL goal of less than 70.  Had had exertional chest pain.  According to original consult note: He saw Dr. Patsy Lager 05/25/2021 and reported unintentional weight loss, about 17 pounds in 18 months. Per Dr. Cyndie Chime addendum 06/05/2021, chest CT returned and showed extensive multi-vessel coronary disease. After further questioning, [name] reported sometimes having chest pain with exertion that resolves quickly when he rests. He was then referred to cardiology.   Previously experienced left chest tightness for a few seconds. He believes he was sitting at rest when the pain occurred.  Also noted some increased shortness of breath with walking up stairs or similar exertion.  For his diet he continues to work on changes including having more vegetables.  Currently he is smoking. He has quit intermittently.  Previously aspirin was discontinued due to bleeding issues subsequent to diverticulitis. He also has a history of a ruptured stomach ulcer with hematochezia.  Has seen Dr. Ewing Schlein and has been appropriately cleared to use aspirin. He is taking his low-dose aspirin.  Overall feels well without any chest pain fevers chills nausea vomiting syncope bleeding.  Past Medical History:  Diagnosis Date   Anxiety 09/20/2011   Basal cell carcinoma 09/17/2013   Left forehead. Excised: 10/14/2013, margins free   Basal cell carcinoma 09/17/2013   Right upper eyelid   Basal cell carcinoma 05/06/2019   Right mid ear helix. Nodular  pattern. Tx: [facility] 06/05/2019   Coronary artery disease involving native coronary artery of native heart with angina pectoris 06/09/2021   Depression    GERD (gastroesophageal reflux disease)    H/O: gastrointestinal hemorrhage    HLD (hyperlipidemia)    HTN (hypertension)    Hx of diverticulitis 01/19/2009   Centricity Description: DIVERTICULITIS, HX OF Qualifier: Diagnosis of  By: Patsy Lager MD, Spencer   Centricity Description: GASTROINTESTINAL HEMORRHAGE, HX OF Qualifier: Diagnosis of  By: Copland MD, Spencer     Hypertriglyceridemia 05/27/2015   Prostate cancer 10/23/2010   Qualifier: Diagnosis of  By: Patsy Lager MD, Spencer     Tobacco abuse     Past Surgical History:  Procedure Laterality Date   INSERTION PROSTATE RADIATION SEED      Current Medications: Current Meds  Medication Sig   aspirin EC 81 MG tablet Take 81 mg by mouth daily. Swallow whole.     Allergies:   Patient has no known allergies.   Social History   Socioeconomic History   Marital status: Married    Spouse name: Not on file   Number of children: Not on file   Years of education: Not on file   Highest education level: Not on file  Occupational History   Occupation: Musician, [place]    Employer: [employer]  Tobacco Use   Smoking status: Every Day    Packs/day: 1.00    Years: 35.00    Additional pack years: 0.00    Total pack years: 35.00    Types: Cigarettes    Last  attempt to quit: 06/16/2019    Years since quitting: 3.4   Smokeless tobacco: Never   Tobacco comments:    Currently on the patch to quit  Vaping Use   Vaping Use: Never used  Substance and Sexual Activity   Alcohol use: Not Currently    Comment: rare   Drug use: No   Sexual activity: Not on file  Other Topics Concern   Not on file  Social History Narrative   Not on file   Social Determinants of Health   Financial Resource Strain: Not on file  Food Insecurity: Not on file  Transportation  Needs: Not on file  Physical Activity: Not on file  Stress: Not on file  Social Connections: Not on file     Family History: The patient's family history includes Breast cancer in an other family member; Coronary artery disease in his father; Heart disease in his paternal grandfather; Heart disease (age of onset: 64) in his father; Stroke in an other family member.  ROS:   Please see the history of present illness.    (+) Left chest tightness (+) Exertional shortness of breath All other systems reviewed and are negative.  EKGs/Labs/Other Studies Reviewed:    The following studies were reviewed today:  Cardiac Studies & Procedures          CT SCANS  CT CORONARY MORPH W/CTA COR W/SCORE 06/21/2021  Addendum 06/21/2021  5:36 PM ADDENDUM REPORT: 06/21/2021 17:33  CLINICAL DATA:  Chest pain  EXAM: Cardiac/Coronary CTA  TECHNIQUE: A non-contrast, gated CT scan was obtained with axial slices of 3 mm through the heart for calcium scoring. Calcium scoring was performed using the Agatston method. A 120 kV prospective, gated, contrast cardiac scan was obtained. Gantry rotation speed was 250 msecs and collimation was 0.6 mm. Two sublingual nitroglycerin tablets (0.8 mg) were given. The 3D data set was reconstructed in 5% intervals of the 35-75% of the R-R cycle. Diastolic phases were analyzed on a dedicated workstation using MPR, MIP, and VRT modes. The patient received 95 cc of contrast.  FINDINGS: Image quality: Excellent.  Noise artifact is: Limited.  Coronary Arteries:  Normal coronary origin.  Right dominance.  Left main: The left main is a large caliber vessel with a normal take off from the left coronary cusp that bifurcates to form a left anterior descending artery and a left circumflex artery. There is minimal ostial calcified plaque (<25%).  Left anterior descending artery: The proximal LAD contains minimal calcified plaque (<25%). The mid LAD contains mild  mixed density plaque (25-49%). The distal LAD is patent. The LAD gives off 2 patent diagonal branches.  Left circumflex artery: The LCX is non-dominant. There is minimal calcified plaque (<25%) in the proximal segment. The LCX gives off 2 patent obtuse marginal branches.  Right coronary artery: The RCA is dominant with normal take off from the right coronary cusp. There is minimal calcified plaque (<25%). The mid and distal segments are patent. The RCA terminates as a PDA and right posterolateral branch without evidence of plaque or stenosis.  Right Atrium: Right atrial size is within normal limits.  Right Ventricle: The right ventricular cavity is within normal limits.  Left Atrium: Left atrial size is normal in size with no left atrial appendage filling defect.  Left Ventricle: The ventricular cavity size is within normal limits. There are no stigmata of prior infarction. There is no abnormal filling defect.  Pulmonary arteries: Normal in size without proximal filling defect.  Pulmonary  veins: Normal pulmonary venous drainage.  Pericardium: Normal thickness with no significant effusion or calcium present.  Cardiac valves: The aortic valve is trileaflet without significant calcification. The mitral valve is normal structure without significant calcification.  Aorta: Normal caliber with no significant disease.  Extra-cardiac findings: See attached radiology report for non-cardiac structures.  IMPRESSION: 1. Coronary calcium score of 393. This was 84th percentile for age-, sex, and race-matched controls.  2. Normal coronary origin with right dominance.  3. Mild mixed density plaque (25-49%) in the mid LAD.  4. Minimal calcified plaque in the LCX/RCA (<25%).  RECOMMENDATIONS: 1. Mild non-obstructive CAD (25-49%). Consider non-atherosclerotic causes of chest pain. Consider preventive therapy and risk factor modification.  Lennie Odor, MD   Electronically  Signed By: Lennie Odor M.D. On: 06/21/2021 17:33  Narrative EXAM: OVER-READ INTERPRETATION  CT CHEST  The following report is an over-read performed by radiologist Dr. Trudie Reed of [facility] on 06/21/2021. This over-read does not include interpretation of cardiac or coronary anatomy or pathology. The coronary calcium score/coronary CTA interpretation by the cardiologist is attached.  COMPARISON:  Chest CT 06/02/2021.  FINDINGS: Aortic atherosclerosis. Within the visualized portions of the thorax there are no suspicious appearing pulmonary nodules or masses, there is no acute consolidative airspace disease, no pleural effusions, no pneumothorax and no lymphadenopathy. Visualized portions of the upper abdomen are unremarkable. There are no aggressive appearing lytic or blastic lesions noted in the visualized portions of the skeleton.  IMPRESSION: 1.  Aortic Atherosclerosis (ICD10-I70.0).  Electronically Signed: By: Trudie Reed M.D. On: 06/21/2021 13:34          CT Chest 06/02/2021: COMPARISON:  None.   FINDINGS: Cardiovascular: Extensive multi-vessel coronary artery calcification. Global cardiac size within normal limits. No pericardial effusion. Central pulmonary arteries are of normal caliber. The thoracic aorta is of normal caliber. Mild atherosclerotic calcification noted within the descending thoracic aorta.   Mediastinum/Nodes: No enlarged mediastinal, hilar, or axillary lymph nodes. Thyroid gland, trachea, and esophagus demonstrate no significant findings.   Lungs/Pleura: Mild centrilobular emphysema. No focal pulmonary nodules or infiltrates. No pneumothorax or pleural effusion. The central airways are widely patent.   Upper Abdomen: Probable simple cortical cyst incompletely visualized within the upper pole the right kidney. Mild hepatic steatosis. No acute abnormality.   Musculoskeletal: 15 mm circumscribed lytic lesion  within the right sixth rib laterally demonstrates no significant matrix and minimal expansion of the rib. No cortical destruction, associated periosteal reaction, or soft tissue mass. Differential considerations are led by nonaggressive lesion such as an enchondroma or unicameral bone cyst. No other lytic or blastic bone lesion. No acute bone abnormality.   IMPRESSION: No suspicious or indeterminate pulmonary nodule identified.   Mild emphysema.   Extensive multi-vessel coronary artery calcification   Aortic Atherosclerosis (ICD10-I70.0) and Emphysema (ICD10-J43.9).  EKG:  EKG is personally reviewed and interpreted. 11/28/2022: Normal sinus rhythm 81 poor R wave progression in V2, likely lead placement. 06/09/2021: Sinus rhythm. Rate 83 bpm.  Recent Labs: 04/16/2022: ALT 12; BUN 17; Creatinine, Ser 1.20; Hemoglobin 14.0; Platelets 258.0; Potassium 4.5; Sodium 138; TSH 1.44   Recent Lipid Panel    Component Value Date/Time   CHOL 114 12/20/2021 0912   TRIG 86 12/20/2021 0912   HDL 48 12/20/2021 0912   CHOLHDL 2.4 12/20/2021 0912   CHOLHDL 4 11/14/2020 0855   VLDL 33.0 11/14/2020 0855   LDLCALC 49 12/20/2021 0912   LDLDIRECT 77.0 10/12/2019 0848     Risk Assessment/Calculations:  Physical Exam:    VS:  BP 128/70   Pulse 81   Ht 5\' 8"  (1.727 m)   Wt 199 lb 12.8 oz (90.6 kg)   SpO2 97%   BMI 30.38 kg/m     Wt Readings from Last 3 Encounters:  11/28/22 199 lb 12.8 oz (90.6 kg)  07/16/22 189 lb (85.7 kg)  05/24/22 193 lb (87.5 kg)    GEN: Well nourished, well developed, in no acute distress HEENT: normal Neck: no JVD, carotid bruits, or masses Cardiac: RRR; no murmurs, rubs, or gallops,no edema  Respiratory:  clear to auscultation bilaterally, normal work of breathing GI: soft, nontender, nondistended, + BS MS: no deformity or atrophy Skin: warm and dry, no rash Neuro:  Alert and Oriented x 3, Strength and sensation are intact Psych: euthymic mood,  full affect   ASSESSMENT:    1. Coronary artery disease involving native coronary artery of native heart without angina pectoris   2. Essential hypertension   3. Tobacco abuse   4. Pure hypercholesterolemia     PLAN:    In order of problems listed above:   Coronary artery disease involving native coronary artery of native heart with angina pectoris [facility]) Coronary artery disease noted on CT scan.  Multivessel.  Coronary CT was performed subsequently and showed mild to moderate nonobstructive coronary artery disease.  Spoke with he and his wife.  Smoking cessation, increased Crestor to 20 mg, potential aspirin or Plavix.  He has had gastric ulcer as well as diverticular bleeding in the past.  It is been several years.  He is taking Protonix.  He states that he almost bled to death.  He was told never to take aspirin again.  I have previously referred him to gastroenterology for comment on either starting aspirin potentially every other day or Plavix 75 mg given his underlying coronary artery disease.  He did go to Dr. Ewing Schlein in November 2022 and in reviewing those notes he was cleared to start aspirin from a GI standpoint.  Heart healthy diet.  Taking low-dose aspirin.  Tobacco abuse Discussed cessation at length.  His daughter is going to try to stop Vaping.  If she is successful over 1 month, he states that he will stop tobacco.  High cholesterol On both Zetia 10 mg as well as Crestor to 20 mg high intensity dose.  LDL goal less than 70. LDL 49.  Excellent.  Heart attack reduction.    Follow-up:   Medication Adjustments/Labs and Tests Ordered: Current medicines are reviewed at length with the patient today.  Concerns regarding medicines are outlined above.   Orders Placed This Encounter  Procedures   EKG 12-Lead     No orders of the defined types were placed in this encounter.    Patient Instructions  Medication Instructions:  Your physician recommends that you continue on  your current medications as directed. Please refer to the Current Medication list given to you today.  *If you need a refill on your cardiac medications before your next appointment, please call your pharmacy*   Follow-Up: At [facility], you and your health needs are our priority.  As part of our continuing mission to provide you with exceptional heart care, we have created designated Provider Care Teams.  These Care Teams include your primary Cardiologist (physician) and Advanced Practice Providers (APPs -  Physician Assistants and Nurse Practitioners) who all work together to provide you with the care you need, when you need it.  Your next appointment:   1 year(s)  Provider:   Donato Schultz, MD        [facility] Stumpf,acting as a scribe for Donato Schultz, MD.,have documented all relevant documentation on the behalf of Donato Schultz, MD,as directed by  Donato Schultz, MD while in the presence of Donato Schultz, MD.  I, Donato Schultz, MD, have reviewed all documentation for this visit. The documentation on 11/28/22 for the exam, diagnosis, procedures, and orders are all accurate and complete.   Signed, Donato Schultz, MD  11/28/2022 10:07 AM    [facility]

## 2022-12-12 ENCOUNTER — Other Ambulatory Visit: Payer: Self-pay | Admitting: Family Medicine

## 2022-12-12 NOTE — Telephone Encounter (Signed)
Last office visit 05/24/2022 for cervicalgia.  Celebrex not on current medication list.  Refill note states:    The original prescription was discontinued on 07/16/2022 by Doyle Askew, CMA. Renewing this prescription may not be appropriate.   No future appointments.  Refill?

## 2022-12-21 ENCOUNTER — Other Ambulatory Visit: Payer: Self-pay | Admitting: Cardiology

## 2023-01-21 ENCOUNTER — Other Ambulatory Visit: Payer: Self-pay | Admitting: Family Medicine

## 2023-02-05 ENCOUNTER — Other Ambulatory Visit: Payer: Self-pay | Admitting: Family Medicine

## 2023-02-26 ENCOUNTER — Other Ambulatory Visit: Payer: Self-pay | Admitting: Family Medicine

## 2023-02-26 NOTE — Telephone Encounter (Signed)
Last office visit 05/24/22 for cervicalgia.  Last refilled 12/12/22 for #30 with 2 refills.  Next Appt: No future appointments.

## 2023-05-06 ENCOUNTER — Other Ambulatory Visit: Payer: Self-pay | Admitting: Cardiology

## 2023-06-13 ENCOUNTER — Other Ambulatory Visit: Payer: Self-pay | Admitting: Family Medicine

## 2023-06-13 NOTE — Telephone Encounter (Signed)
Last office visit 05/24/2022 for Cervicalgia.  Last refilled 02/26/2023 for #30 with 2 refills.  Next Appt: No future appointments.

## 2023-06-24 ENCOUNTER — Telehealth: Payer: Self-pay | Admitting: *Deleted

## 2023-06-24 ENCOUNTER — Other Ambulatory Visit: Payer: Self-pay | Admitting: Family Medicine

## 2023-06-24 DIAGNOSIS — E7800 Pure hypercholesterolemia, unspecified: Secondary | ICD-10-CM

## 2023-06-24 DIAGNOSIS — C61 Malignant neoplasm of prostate: Secondary | ICD-10-CM

## 2023-06-24 DIAGNOSIS — E119 Type 2 diabetes mellitus without complications: Secondary | ICD-10-CM

## 2023-06-24 DIAGNOSIS — R5383 Other fatigue: Secondary | ICD-10-CM

## 2023-06-24 NOTE — Telephone Encounter (Signed)
-----   Message from Lovena Neighbours sent at 06/24/2023  2:07 PM EST ----- Regarding: Labs for Wednesday 11.20.24 Please put physical lab orders in future. Thank you, Denny Peon

## 2023-06-26 ENCOUNTER — Other Ambulatory Visit (INDEPENDENT_AMBULATORY_CARE_PROVIDER_SITE_OTHER): Payer: BC Managed Care – PPO

## 2023-06-26 DIAGNOSIS — E7800 Pure hypercholesterolemia, unspecified: Secondary | ICD-10-CM

## 2023-06-26 DIAGNOSIS — R5383 Other fatigue: Secondary | ICD-10-CM | POA: Diagnosis not present

## 2023-06-26 DIAGNOSIS — C61 Malignant neoplasm of prostate: Secondary | ICD-10-CM

## 2023-06-26 DIAGNOSIS — E119 Type 2 diabetes mellitus without complications: Secondary | ICD-10-CM | POA: Diagnosis not present

## 2023-06-26 LAB — LIPID PANEL
Cholesterol: 117 mg/dL (ref 0–200)
HDL: 44.1 mg/dL (ref >39.00)
LDL Cholesterol: 51 mg/dL (ref 0–99)
NonHDL: 73.16
Total CHOL/HDL Ratio: 3
Triglycerides: 110 mg/dL (ref 0.0–149.0)
VLDL: 22 mg/dL (ref 0.0–40.0)

## 2023-06-26 LAB — CBC WITH DIFFERENTIAL/PLATELET
Basophils Absolute: 0.1 10*3/uL (ref 0.0–0.1)
Basophils Relative: 1.1 % (ref 0.0–3.0)
Eosinophils Absolute: 0.1 10*3/uL (ref 0.0–0.7)
Eosinophils Relative: 1.8 % (ref 0.0–5.0)
HCT: 44.1 % (ref 39.0–52.0)
Hemoglobin: 14.7 g/dL (ref 13.0–17.0)
Lymphocytes Relative: 26.3 % (ref 12.0–46.0)
Lymphs Abs: 1.8 10*3/uL (ref 0.7–4.0)
MCHC: 33.3 g/dL (ref 30.0–36.0)
MCV: 91.8 fL (ref 78.0–100.0)
Monocytes Absolute: 0.5 10*3/uL (ref 0.1–1.0)
Monocytes Relative: 7.9 % (ref 3.0–12.0)
Neutro Abs: 4.3 10*3/uL (ref 1.4–7.7)
Neutrophils Relative %: 62.9 % (ref 43.0–77.0)
Platelets: 279 10*3/uL (ref 150.0–400.0)
RBC: 4.8 Mil/uL (ref 4.22–5.81)
RDW: 13.9 % (ref 11.5–15.5)
WBC: 6.9 10*3/uL (ref 4.0–10.5)

## 2023-06-26 LAB — BASIC METABOLIC PANEL
BUN: 20 mg/dL (ref 6–23)
CO2: 32 meq/L (ref 19–32)
Calcium: 9.7 mg/dL (ref 8.4–10.5)
Chloride: 100 meq/L (ref 96–112)
Creatinine, Ser: 1.41 mg/dL (ref 0.40–1.50)
GFR: 52.76 mL/min — ABNORMAL LOW (ref >60.00)
Glucose, Bld: 103 mg/dL — ABNORMAL HIGH (ref 70–99)
Potassium: 4.9 meq/L (ref 3.5–5.1)
Sodium: 137 meq/L (ref 135–145)

## 2023-06-26 LAB — HEPATIC FUNCTION PANEL
ALT: 12 U/L (ref 0–53)
AST: 15 U/L (ref 0–37)
Albumin: 4.7 g/dL (ref 3.5–5.2)
Alkaline Phosphatase: 49 U/L (ref 39–117)
Bilirubin, Direct: 0.2 mg/dL (ref 0.0–0.3)
Total Bilirubin: 0.7 mg/dL (ref 0.2–1.2)
Total Protein: 6.8 g/dL (ref 6.0–8.3)

## 2023-06-26 LAB — HEMOGLOBIN A1C: Hgb A1c MFr Bld: 6.3 % (ref 4.6–6.5)

## 2023-06-26 LAB — TSH: TSH: 2.55 u[IU]/mL (ref 0.35–5.50)

## 2023-06-27 LAB — PSA, TOTAL WITH REFLEX TO PSA, FREE: PSA, Total: <0.1 ng/mL (ref <=4.0)

## 2023-06-30 NOTE — Progress Notes (Unsigned)
Logan Norris T. Logan Fader, MD, CAQ Sports Medicine [facility] [address] [place], [address]  Phone: [phone]  FAX: [phone]  Logan Norris - 62 y.o. male  MRN [id number]  Date of Birth: 1961/03/13  Date: 07/01/2023  PCP: Hannah Beat, MD  Referral: Hannah Beat, MD  No chief complaint on file.  Patient Care Team: Hannah Beat, MD as PCP - General Anne Fu Veverly Fells, MD as PCP - Cardiology (Cardiology) Subjective:   Logan Norris is a 62 y.o. pleasant patient who presents with the following:  Preventative Health Maintenance Visit:  Health Maintenance Summary Reviewed and updated, unless pt declines services.  Tobacco History Reviewed. Alcohol: No concerns, no excessive use Exercise Habits: Some activity, rec at least 30 mins 5 times a week STD concerns: no risk or activity to increase risk Drug Use: None  He does have a history of diabetes, but he only had 1 elevated A1c in the past and it was 1 year ago at 6.6.  Right now his A1c is at 6.3.  He does have a history of coronary disease as well as prostate cancer.  His PSA is undetectable.  HTN: Tolerating all medications without side effects Stable and at goal No CP, no sob. No HA.  BP Readings from Last 3 Encounters:  11/28/22 128/70  07/16/22 118/76  05/24/22 110/62    Basic Metabolic Panel:    Component Value Date/Time   NA 137 06/26/2023 0827   K 4.9 06/26/2023 0827   CL 100 06/26/2023 0827   CO2 32 06/26/2023 0827   BUN 20 06/26/2023 0827   CREATININE 1.41 06/26/2023 0827   GLUCOSE 103 (H) 06/26/2023 0827   CALCIUM 9.7 06/26/2023 0827    Lipids: Doing well, stable. Tolerating meds fine with no SE. Panel reviewed with patient.  Lipids: Lab Results  Component Value Date   CHOL 117 06/26/2023   Lab Results  Component Value Date   HDL 44.10 06/26/2023   Lab Results  Component Value Date   LDLCALC 51 06/26/2023   Lab Results   Component Value Date   TRIG 110.0 06/26/2023   Lab Results  Component Value Date   CHOLHDL 3 06/26/2023    Lab Results  Component Value Date   ALT 12 06/26/2023   AST 15 06/26/2023   ALKPHOS 49 06/26/2023   BILITOT 0.7 06/26/2023    Covid booster Lung cancer screen Pneumonia vaccine  Health Maintenance  Topic Date Due   Diabetic kidney evaluation - Urine ACR  Never done   COVID-19 Vaccine (3 - Pfizer risk series) 06/09/2020   Lung Cancer Screening  06/21/2022   INFLUENZA VACCINE  03/07/2023   Diabetic kidney evaluation - eGFR measurement  06/25/2024   Colonoscopy  01/18/2025   DTaP/Tdap/Td (3 - Td or Tdap) 12/28/2026   Hepatitis C Screening  Completed   HIV Screening  Completed   Zoster Vaccines- Shingrix  Completed   HPV VACCINES  Aged Out   Immunization History  Administered Date(s) Administered   Influenza Whole 06/06/2010   Influenza, Quadrivalent, Recombinant, Inj, Pf 05/18/2018   Influenza,inj,Quad PF,6+ Mos 06/02/2019, 05/25/2021   Influenza-Unspecified 05/07/2017   PFIZER(Purple Top)SARS-COV-2 Vaccination 04/03/2020, 05/12/2020   Td 08/06/2005   Tdap 12/27/2016   Zoster Recombinant(Shingrix) 04/23/2022, 08/23/2022   Patient Active Problem List   Diagnosis Date Noted   CAD (coronary artery disease) 06/09/2021    Priority: High   Prostate cancer (HCC) 10/23/2010    Priority: High  Depression, major, recurrent, moderate (HCC) 05/13/2017    Priority: Medium    GAD (generalized anxiety disorder) 05/13/2017    Priority: Medium    Tobacco abuse     Priority: Medium    Essential hypertension 02/09/2010    Priority: Medium    Pure hypercholesterolemia 01/19/2009    Priority: Medium    Precordial pain 09/14/2021   Hypertriglyceridemia 05/27/2015   Bleeding ulcer, hx of 09/30/2012   GERD 01/19/2009   Hx of diverticulitis 01/19/2009    Past Medical History:  Diagnosis Date   Anxiety 09/20/2011   Basal cell carcinoma 09/17/2013   Left forehead.  Excised: 10/14/2013, margins free   Basal cell carcinoma 09/17/2013   Right upper eyelid   Basal cell carcinoma 05/06/2019   Right mid ear helix. Nodular pattern. Tx: [facility] 06/05/2019   Coronary artery disease involving native coronary artery of native heart with angina pectoris (HCC) 06/09/2021   Depression    GERD (gastroesophageal reflux disease)    H/O: gastrointestinal hemorrhage    HLD (hyperlipidemia)    HTN (hypertension)    Hx of diverticulitis 01/19/2009   Centricity Description: DIVERTICULITIS, HX OF Qualifier: Diagnosis of  By: Patsy Lager MD, Evanee Lubrano   Centricity Description: GASTROINTESTINAL HEMORRHAGE, HX OF Qualifier: Diagnosis of  By: Lanaya Bennis MD, Jachai Okazaki     Hypertriglyceridemia 05/27/2015   Prostate cancer (HCC) 10/23/2010   Qualifier: Diagnosis of  By: Patsy Lager MD, Mounir Skipper     Tobacco abuse     Past Surgical History:  Procedure Laterality Date   INSERTION PROSTATE RADIATION SEED      Family History  Problem Relation Age of Onset   Coronary artery disease Father        CABG x2   Heart disease Father 73       CABG   Stroke Other        Grandparent   Breast cancer Other        Family history -1st degree relative <50   Heart disease Paternal Grandfather     Social History   Social History Narrative   Not on file    Past Medical History, Surgical History, Social History, Family History, Problem List, Medications, and Allergies have been reviewed and updated if relevant.  Review of Systems: Pertinent positives are listed above.  Otherwise, a full 14 point review of systems has been done in full and it is negative except where it is noted positive.  Objective:   There were no vitals taken for this visit. Ideal Body Weight:    Ideal Body Weight:   No results found.    10/19/2019    8:06 AM 09/01/2018    8:25 AM 06/24/2017   12:43 PM 05/13/2017   11:47 AM  Depression screen PHQ 2/9  Decreased Interest 0 0 1 3  Down, Depressed, Hopeless 0 0 0 3  PHQ - 2  Score 0 0 1 6  Altered sleeping   3 3  Tired, decreased energy   0 2  Change in appetite   0 2  Feeling bad or failure about yourself    0 2  Trouble concentrating   0 1  Moving slowly or fidgety/restless   0 0  Suicidal thoughts   0 1  PHQ-9 Score   4 17  Difficult doing work/chores   Somewhat difficult Very difficult     GEN: well developed, well nourished, no acute distress Eyes: conjunctiva and lids normal, PERRLA, EOMI ENT: TM clear, nares clear, oral exam WNL Neck:  supple, no lymphadenopathy, no thyromegaly, no JVD Pulm: clear to auscultation and percussion, respiratory effort normal CV: regular rate and rhythm, S1-S2, no murmur, rub or gallop, no bruits, peripheral pulses normal and symmetric, no cyanosis, clubbing, edema or varicosities GI: soft, non-tender; no hepatosplenomegaly, masses; active bowel sounds all quadrants GU: deferred Lymph: no cervical, axillary or inguinal adenopathy MSK: gait normal, muscle tone and strength WNL, no joint swelling, effusions, discoloration, crepitus  SKIN: clear, good turgor, color WNL, no rashes, lesions, or ulcerations Neuro: normal mental status, normal strength, sensation, and motion Psych: alert; oriented to person, place and time, normally interactive and not anxious or depressed in appearance.  All labs reviewed with patient. Results for orders placed or performed in visit on 06/26/23  TSH  Result Value Ref Range   TSH 2.55 0.35 - 5.50 uIU/mL  Hepatic function panel  Result Value Ref Range   Total Bilirubin 0.7 0.2 - 1.2 mg/dL   Bilirubin, Direct 0.2 0.0 - 0.3 mg/dL   Alkaline Phosphatase 49 39 - 117 U/L   AST 15 0 - 37 U/L   ALT 12 0 - 53 U/L   Total Protein 6.8 6.0 - 8.3 g/dL   Albumin 4.7 3.5 - 5.2 g/dL  Basic metabolic panel  Result Value Ref Range   Sodium 137 135 - 145 mEq/L   Potassium 4.9 3.5 - 5.1 mEq/L   Chloride 100 96 - 112 mEq/L   CO2 32 19 - 32 mEq/L   Glucose, Bld 103 (H) 70 - 99 mg/dL   BUN 20 6 - 23  mg/dL   Creatinine, Ser 9.62 0.40 - 1.50 mg/dL   GFR 95.28 (L) >41.32 mL/min   Calcium 9.7 8.4 - 10.5 mg/dL  PSA, Total with Reflex to PSA, Free  Result Value Ref Range   PSA, Total <0.1 < OR = 4.0 ng/mL  CBC with Differential/Platelet  Result Value Ref Range   WBC 6.9 4.0 - 10.5 K/uL   RBC 4.80 4.22 - 5.81 Mil/uL   Hemoglobin 14.7 13.0 - 17.0 g/dL   HCT 44.0 10.2 - 72.5 %   MCV 91.8 78.0 - 100.0 fl   MCHC 33.3 30.0 - 36.0 g/dL   RDW 36.6 44.0 - 34.7 %   Platelets 279.0 150.0 - 400.0 K/uL   Neutrophils Relative % 62.9 43.0 - 77.0 %   Lymphocytes Relative 26.3 12.0 - 46.0 %   Monocytes Relative 7.9 3.0 - 12.0 %   Eosinophils Relative 1.8 0.0 - 5.0 %   Basophils Relative 1.1 0.0 - 3.0 %   Neutro Abs 4.3 1.4 - 7.7 K/uL   Lymphs Abs 1.8 0.7 - 4.0 K/uL   Monocytes Absolute 0.5 0.1 - 1.0 K/uL   Eosinophils Absolute 0.1 0.0 - 0.7 K/uL   Basophils Absolute 0.1 0.0 - 0.1 K/uL  Hemoglobin A1c  Result Value Ref Range   Hgb A1c MFr Bld 6.3 4.6 - 6.5 %  Lipid panel  Result Value Ref Range   Cholesterol 117 0 - 200 mg/dL   Triglycerides 425.9 0.0 - 149.0 mg/dL   HDL 56.38 >75.64 mg/dL   VLDL 33.2 0.0 - 95.1 mg/dL   LDL Cholesterol 51 0 - 99 mg/dL   Total CHOL/HDL Ratio 3    NonHDL 73.16     Assessment and Plan:     ICD-10-CM   1. Healthcare maintenance  Z00.00       Health Maintenance Exam: The patient's preventative maintenance and recommended screening tests for an annual wellness  exam were reviewed in full today. Brought up to date unless services declined.  Counselled on the importance of diet, exercise, and its role in overall health and mortality. The patient's FH and SH was reviewed, including their home life, tobacco status, and drug and alcohol status.  Follow-up in 1 year for physical exam or additional follow-up below.  Disposition: No follow-ups on file.  No orders of the defined types were placed in this encounter.  There are no discontinued medications. No  orders of the defined types were placed in this encounter.   Signed,  Elpidio Galea. Deray Dawes, MD   Allergies as of 07/01/2023   No Known Allergies      Medication List        Accurate as of June 30, 2023  7:18 PM. If you have any questions, ask your nurse or doctor.          aspirin EC 81 MG tablet Take 81 mg by mouth daily. Swallow whole.   buPROPion 150 MG 24 hr tablet Commonly known as: WELLBUTRIN XL TAKE ONE TABLET BY MOUTH EVERY DAY   celecoxib 200 MG capsule Commonly known as: CELEBREX TAKE ONE CAPSULE BY MOUTH EVERY DAY   citalopram 40 MG tablet Commonly known as: CELEXA TAKE ONE TABLET BY MOUTH DAILY   ezetimibe 10 MG tablet Commonly known as: ZETIA Take 1 tablet by mouth daily.   hydrochlorothiazide 25 MG tablet Commonly known as: HYDRODIURIL TAKE ONE TABLET BY MOUTH EVERY DAY   pantoprazole 40 MG tablet Commonly known as: PROTONIX TAKE ONE TABLET BY MOUTH DAILY   promethazine-dextromethorphan 6.25-15 MG/5ML syrup Commonly known as: PROMETHAZINE-DM Take 5 mLs by mouth 2 (two) times daily as needed for cough.   rosuvastatin 20 MG tablet Commonly known as: CRESTOR Take 1 tablet (20 mg total) by mouth daily.   tamsulosin 0.4 MG Caps capsule Commonly known as: FLOMAX TAKE ONE CAPSULE BY MOUTH DAILY

## 2023-07-01 ENCOUNTER — Encounter: Payer: Self-pay | Admitting: Family Medicine

## 2023-07-01 ENCOUNTER — Ambulatory Visit (INDEPENDENT_AMBULATORY_CARE_PROVIDER_SITE_OTHER): Payer: BC Managed Care – PPO | Admitting: Family Medicine

## 2023-07-01 VITALS — BP 100/62 | HR 89 | Temp 98.2°F | Ht 68.25 in | Wt 198.4 lb

## 2023-07-01 DIAGNOSIS — E119 Type 2 diabetes mellitus without complications: Secondary | ICD-10-CM | POA: Diagnosis not present

## 2023-07-01 DIAGNOSIS — F17200 Nicotine dependence, unspecified, uncomplicated: Secondary | ICD-10-CM | POA: Diagnosis not present

## 2023-07-01 DIAGNOSIS — Z23 Encounter for immunization: Secondary | ICD-10-CM | POA: Diagnosis not present

## 2023-07-01 DIAGNOSIS — Z0000 Encounter for general adult medical examination without abnormal findings: Secondary | ICD-10-CM | POA: Diagnosis not present

## 2023-07-02 ENCOUNTER — Encounter: Payer: Self-pay | Admitting: Family Medicine

## 2023-07-02 ENCOUNTER — Other Ambulatory Visit: Payer: Self-pay

## 2023-07-02 ENCOUNTER — Encounter: Payer: Self-pay | Admitting: Acute Care

## 2023-07-02 DIAGNOSIS — Z122 Encounter for screening for malignant neoplasm of respiratory organs: Secondary | ICD-10-CM

## 2023-07-02 DIAGNOSIS — F17210 Nicotine dependence, cigarettes, uncomplicated: Secondary | ICD-10-CM

## 2023-07-02 DIAGNOSIS — Z87891 Personal history of nicotine dependence: Secondary | ICD-10-CM

## 2023-07-02 LAB — MICROALBUMIN / CREATININE URINE RATIO
Creatinine,U: 97.7 mg/dL
Microalb Creat Ratio: 0.7 mg/g (ref 0.0–30.0)
Microalb, Ur: <0.7 mg/dL (ref 0.0–1.9)

## 2023-07-11 ENCOUNTER — Encounter: Payer: Self-pay | Admitting: Physician Assistant

## 2023-07-11 ENCOUNTER — Ambulatory Visit: Payer: BC Managed Care – PPO | Admitting: Physician Assistant

## 2023-07-11 DIAGNOSIS — Z85118 Personal history of other malignant neoplasm of bronchus and lung: Secondary | ICD-10-CM | POA: Diagnosis not present

## 2023-07-11 DIAGNOSIS — F17210 Nicotine dependence, cigarettes, uncomplicated: Secondary | ICD-10-CM | POA: Diagnosis not present

## 2023-07-11 NOTE — Patient Instructions (Signed)

## 2023-07-11 NOTE — Progress Notes (Signed)
Virtual Visit via Telephone Note  I connected with Logan Norris on 07/11/23 at  2:00 PM EST by telephone and verified that I am speaking with the correct person using two identifiers.  Location: Patient: Home Provider: Remotely   I discussed the limitations, risks, security and privacy concerns of performing an evaluation and management service by telephone and the availability of in person appointments. I also discussed with the patient that there may be a patient responsible charge related to this service. The patient expressed understanding and agreed to proceed.    Shared Decision Making Visit Lung Cancer Screening Program [id number])   Eligibility: Age 62 y.o. Pack Years Smoking History Calculation: 49 Recent History of coughing up blood: No Unexplained weight loss? No ( >Than 15 pounds within the last 6 months ) Prior History Lung / other cancer: No (Diagnosis within the last 5 years already requiring surveillance chest CT Scans). Smoking Status Current Smoker  Visit Components:     Discussion included one or more decision making aids. Yes Discussion included risk/benefits of screening. Yes Discussion included potential follow up diagnostic testing for abnormal scans. Yes Discussion included meaning and risk of Over Diagnosis. Yes Discussion included meaning and risk of False Positives. Yes Discussion included meaning of total radiation exposure. Yes  Counseling Included: Importance of adherence to annual lung cancer LDCT screening. Yes Impact of comorbidities on ability to participate in the program. Yes Ability and willingness to under diagnostic treatment. Yes  Smoking Cessation Counseling: Current Smokers:  Discussed importance of smoking cessation. Yes Information about tobacco cessation classes and interventions provided to patient. Yes Symptomatic Patient. No  Counseling: NA Diagnosis Code: Tobacco Use Z72.0 Asymptomatic Patient Yes   Counseling  (Intermediate counseling: > three minutes counseling) [id number]    Rutherford Guys, PA-C

## 2023-07-15 ENCOUNTER — Ambulatory Visit
Admission: RE | Admit: 2023-07-15 | Discharge: 2023-07-15 | Disposition: A | Discharge status: Home / Self Care | Payer: BC Managed Care – PPO | Source: Ambulatory Visit | Attending: Family Medicine | Admitting: Family Medicine

## 2023-07-15 DIAGNOSIS — F17210 Nicotine dependence, cigarettes, uncomplicated: Secondary | ICD-10-CM

## 2023-07-15 DIAGNOSIS — Z87891 Personal history of nicotine dependence: Secondary | ICD-10-CM

## 2023-07-15 DIAGNOSIS — Z122 Encounter for screening for malignant neoplasm of respiratory organs: Secondary | ICD-10-CM

## 2023-07-29 ENCOUNTER — Other Ambulatory Visit: Payer: Self-pay | Admitting: Acute Care

## 2023-07-29 DIAGNOSIS — Z122 Encounter for screening for malignant neoplasm of respiratory organs: Secondary | ICD-10-CM

## 2023-07-29 DIAGNOSIS — Z87891 Personal history of nicotine dependence: Secondary | ICD-10-CM

## 2023-07-29 DIAGNOSIS — F17210 Nicotine dependence, cigarettes, uncomplicated: Secondary | ICD-10-CM

## 2023-09-06 IMAGING — CT CT HEART MORP W/ CTA COR W/ SCORE W/ CA W/CM &/OR W/O CM
4 of 7 series · 8 of 20 positions shown, 9 images · IV contrast (APPLIED)
Comparison: Chest CT 06/02/2021.
COMPARISON: Chest CT 06/02/2021.

Addendum:
EXAM:
OVER-READ INTERPRETATION  CT CHEST

The following report is an over-read performed by radiologist Dr.
Moutlwatsi Boleseng Fanabi [[email or web address]] on 06/21/2021. This
over-read does not include interpretation of cardiac or coronary
anatomy or pathology. The coronary calcium score/coronary CTA
interpretation by the cardiologist is attached.
CLINICAL DATA: Chest pain
Cardiac/Coronary CTA
TECHNIQUE: A non-contrast, gated CT scan was obtained with axial slices of 3 mm
through the heart for calcium scoring. Calcium scoring was performed
using the Agatston method. A 120 kV prospective, gated, contrast
cardiac scan was obtained. Gantry rotation speed was 250 msecs and
collimation was 0.6 mm. Two sublingual nitroglycerin tablets (0.8
mg) were given. The 3D data set was reconstructed in 5% intervals of
the 35-75% of the R-R cycle. Diastolic phases were analyzed on a
dedicated workstation using MPR, MIP, and VRT modes. The patient
received 95 cc of contrast.

[Series 6: best diast · axial · 0.39mm/px · z∈[+1158,+1200]mm · 2 of 316 slices shown, 3 images]
[im 106/316  vessel]
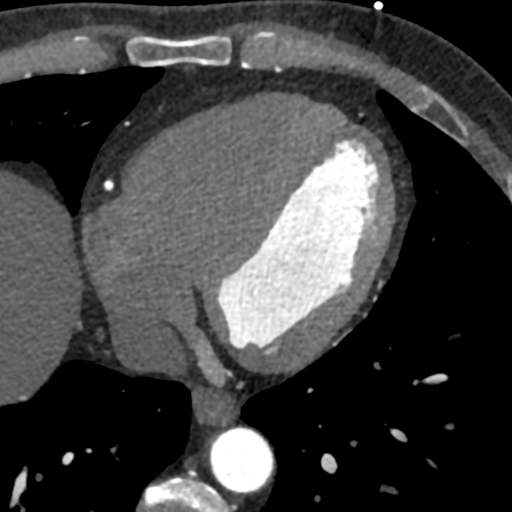
[im 106/316  lung]
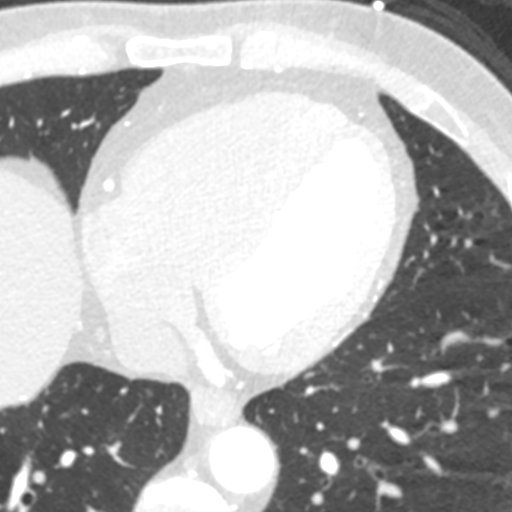
[im 211/316  vessel]
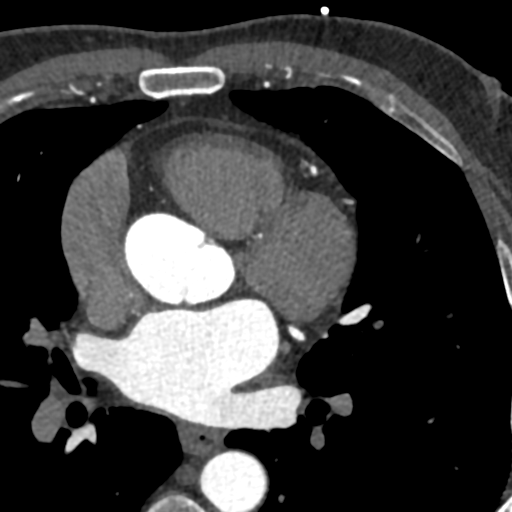

[Series 7: best syst · axial · 0.39mm/px · z∈[+1158,+1200]mm · 2 of 316 slices shown]
[im 106/316  vessel]
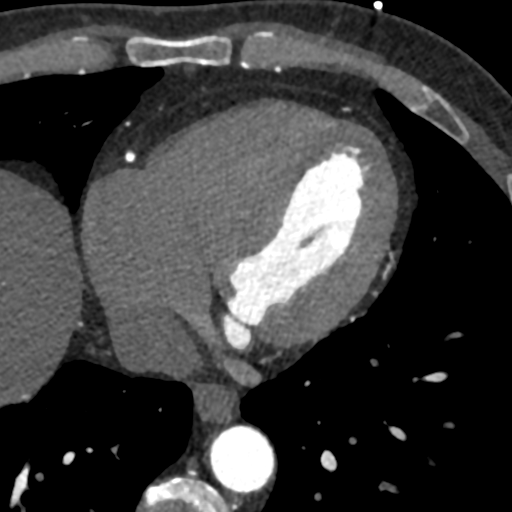
[im 211/316  vessel]
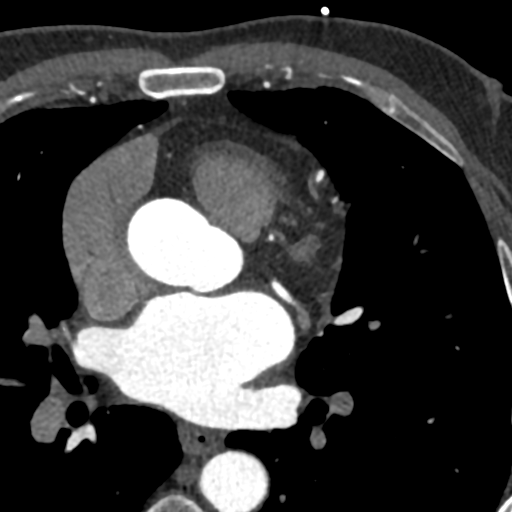

[Series 8: ts diast sharp · axial · 0.39mm/px · z∈[+1158,+1200]mm · 2 of 316 slices shown]
[im 106/316  lung]
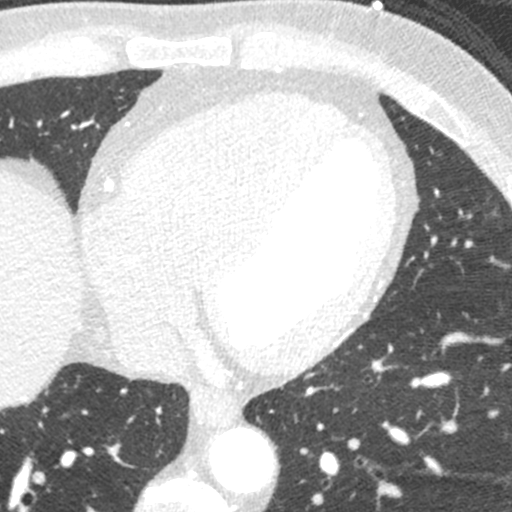
[im 211/316  lung]
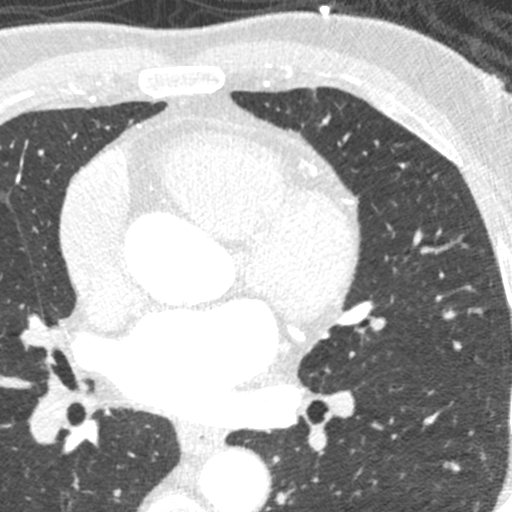

[Series 9: ts syst sharp · axial · 0.39mm/px · z∈[+1158,+1200]mm · 2 of 316 slices shown]
[im 106/316  lung]
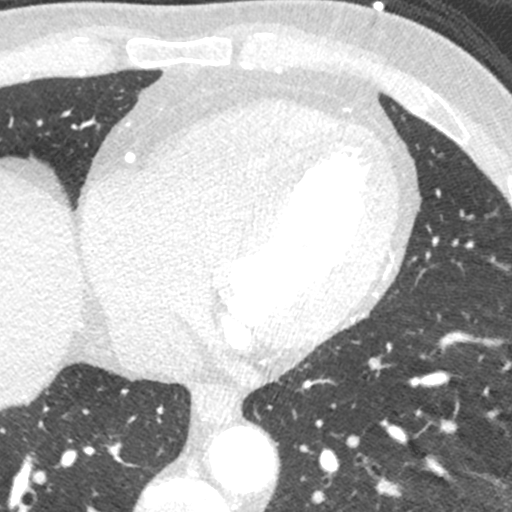
[im 211/316  lung]
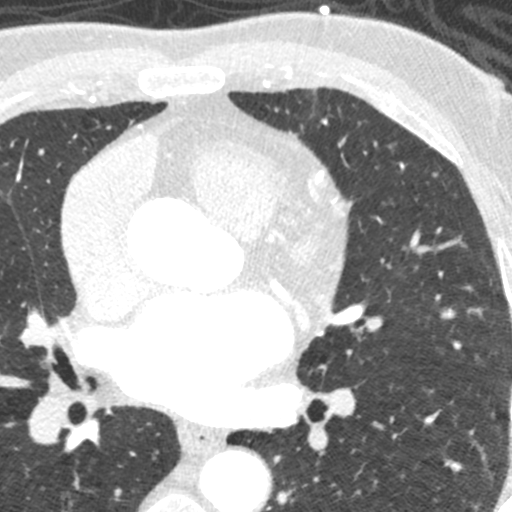

[8 of 20 positions shown; findings below may reference images not displayed]

FINDINGS: Aortic atherosclerosis. Within the visualized portions of the thorax
there are no suspicious appearing pulmonary nodules or masses, there
is no acute consolidative airspace disease, no pleural effusions, no
pneumothorax and no lymphadenopathy. Visualized portions of the
upper abdomen are unremarkable. There are no aggressive appearing
lytic or blastic lesions noted in the visualized portions of the
skeleton.
IMPRESSION: 1.  Aortic Atherosclerosis (PWRJ8-BTU.U).
FINDINGS: Image quality: Excellent.

Noise artifact is: Limited.

Coronary Arteries:  Normal coronary origin.  Right dominance.

Left main: The left main is a large caliber vessel with a normal
take off from the left coronary cusp that bifurcates to form a left
anterior descending artery and a left circumflex artery. There is
minimal ostial calcified plaque (<25%).

Left anterior descending artery: The proximal LAD contains minimal
calcified plaque (<25%). The mid LAD contains mild mixed density
plaque (25-49%). The distal LAD is patent. The LAD gives off 2
patent diagonal branches.

Left circumflex artery: The LCX is non-dominant. There is minimal
calcified plaque (<25%) in the proximal segment. The LCX gives off 2
patent obtuse marginal branches.

Right coronary artery: The RCA is dominant with normal take off from
the right coronary cusp. There is minimal calcified plaque (<25%).
The mid and distal segments are patent. The RCA terminates as a PDA
and right posterolateral branch without evidence of plaque or
stenosis.

Right Atrium: Right atrial size is within normal limits.

Right Ventricle: The right ventricular cavity is within normal
limits.

Left Atrium: Left atrial size is normal in size with no left atrial
appendage filling defect.

Left Ventricle: The ventricular cavity size is within normal limits.
There are no stigmata of prior infarction. There is no abnormal
filling defect.

Pulmonary arteries: Normal in size without proximal filling defect.

Pulmonary veins: Normal pulmonary venous drainage.

Pericardium: Normal thickness with no significant effusion or
calcium present.

Cardiac valves: The aortic valve is trileaflet without significant
calcification. The mitral valve is normal structure without
significant calcification.

Aorta: Normal caliber with no significant disease.

Extra-cardiac findings: See attached radiology report for
non-cardiac structures.
IMPRESSION: 1. Coronary calcium score of 393. This was 84th percentile for age-,
sex, and race-matched controls.

2. Normal coronary origin with right dominance.

3. Mild mixed density plaque (25-49%) in the mid LAD.

4. Minimal calcified plaque in the LCX/RCA (<25%).

RECOMMENDATIONS:
1. Mild non-obstructive CAD (25-49%). Consider non-atherosclerotic
causes of chest pain. Consider preventive therapy and risk factor
modification.

*** End of Addendum ***
EXAM:
OVER-READ INTERPRETATION  CT CHEST

The following report is an over-read performed by radiologist Dr.
Moutlwatsi Boleseng Fanabi [REDACTED] on 06/21/2021. This
over-read does not include interpretation of cardiac or coronary
anatomy or pathology. The coronary calcium score/coronary CTA
interpretation by the cardiologist is attached.
FINDINGS: Aortic atherosclerosis. Within the visualized portions of the thorax
there are no suspicious appearing pulmonary nodules or masses, there
is no acute consolidative airspace disease, no pleural effusions, no
pneumothorax and no lymphadenopathy. Visualized portions of the
upper abdomen are unremarkable. There are no aggressive appearing
lytic or blastic lesions noted in the visualized portions of the
skeleton.
IMPRESSION: 1.  Aortic Atherosclerosis (PWRJ8-BTU.U).

## 2023-09-18 ENCOUNTER — Other Ambulatory Visit: Payer: Self-pay | Admitting: Family Medicine

## 2023-09-18 NOTE — Telephone Encounter (Signed)
Last office visit 07/01/23 for CPE.  Last refilled 06/14/2023 for #30 with 2 refills.  Next Appt: No future appointments.

## 2023-09-19 ENCOUNTER — Other Ambulatory Visit: Payer: Self-pay | Admitting: Family Medicine

## 2023-09-23 ENCOUNTER — Encounter: Payer: Self-pay | Admitting: Family Medicine

## 2023-09-23 ENCOUNTER — Other Ambulatory Visit: Payer: Self-pay | Admitting: Family Medicine

## 2023-09-23 NOTE — Telephone Encounter (Signed)
 Copied from CRM [id number]. Topic: Clinical - Medication Question >> Sep 23, 2023  3:01 PM Deaijah H wrote: Reason for CRM: Patient called in wantint to know when could Dr. Patsy Lager could approve of pending medication due to him being out and going out of town Wednesday and also would like for Dr. Patsy Lager nurse to give him a call / please call [phone]

## 2023-11-26 ENCOUNTER — Other Ambulatory Visit: Payer: Self-pay | Admitting: Cardiology

## 2023-12-17 ENCOUNTER — Other Ambulatory Visit: Payer: Self-pay | Admitting: Family Medicine

## 2024-01-06 ENCOUNTER — Other Ambulatory Visit: Payer: Self-pay | Admitting: Cardiology

## 2024-01-21 DIAGNOSIS — H0279 Other degenerative disorders of eyelid and periocular area: Secondary | ICD-10-CM | POA: Diagnosis not present

## 2024-01-21 DIAGNOSIS — H02413 Mechanical ptosis of bilateral eyelids: Secondary | ICD-10-CM | POA: Diagnosis not present

## 2024-01-21 DIAGNOSIS — H02423 Myogenic ptosis of bilateral eyelids: Secondary | ICD-10-CM | POA: Diagnosis not present

## 2024-04-20 ENCOUNTER — Other Ambulatory Visit: Payer: Self-pay | Admitting: Cardiology

## 2024-04-20 ENCOUNTER — Other Ambulatory Visit: Payer: Self-pay | Admitting: Family Medicine

## 2024-04-20 NOTE — Telephone Encounter (Signed)
 Last office visit 07/01/23 for CPE.  Last refilled 09/18/23 for #90 with 1 refills.  Next Appt: No future appointments.

## 2024-04-29 ENCOUNTER — Other Ambulatory Visit: Payer: Self-pay | Admitting: Family Medicine

## 2024-04-29 MED ORDER — EZETIMIBE 10 MG PO TABS: 10.0000 mg | ORAL_TABLET | Freq: Every day | ORAL | 1 refills | Status: DC

## 2024-04-29 NOTE — Telephone Encounter (Signed)
 Copied from CRM #[id number]. Topic: Clinical - Medication Refill >> Apr 29, 2024  9:50 AM Henretta I wrote: Medication: celecoxib  (CELEBREX ) 200 MG capsule ezetimibe  (ZETIA ) 10 MG tablet  Has the patient contacted their pharmacy? No (Agent: If no, request that the patient contact the pharmacy for the refill. If patient does not wish to contact the pharmacy document the reason why and proceed with request.) (Agent: If yes, when and what did the pharmacy advise?)  This is the patient's preferred pharmacy:  [facility] [place], [place] - [address] [place] [address] Phone: [phone] Fax: [phone]  Is this the correct pharmacy for this prescription? Yes If no, delete pharmacy and type the correct one.   Has the prescription been filled recently? Yes  Is the patient out of the medication? No   Has the patient been seen for an appointment in the last year OR does the patient have an upcoming appointment? Yes  Can we respond through MyChart? Yes  Agent: Please be advised that Rx refills may take up to 3 business days. We ask that you follow-up with your pharmacy.

## 2024-04-29 NOTE — Telephone Encounter (Signed)
 Celebrex  was refilled on 04/20/2024 for #90 with 1 refill by Dr. Watt.  Zetia  was refilled by Dr. Jeffrie on 04/20/2024 for #15 with no refills with note patient is overdue for follow up with Cardiology.  I will send to Dr. Watt to see if he wants to refill Zetia  or have patient follow up with Cardiology.

## 2024-04-30 ENCOUNTER — Other Ambulatory Visit: Payer: Self-pay | Admitting: Acute Care

## 2024-04-30 DIAGNOSIS — Z122 Encounter for screening for malignant neoplasm of respiratory organs: Secondary | ICD-10-CM

## 2024-04-30 DIAGNOSIS — F17210 Nicotine dependence, cigarettes, uncomplicated: Secondary | ICD-10-CM

## 2024-04-30 DIAGNOSIS — Z87891 Personal history of nicotine dependence: Secondary | ICD-10-CM

## 2024-05-20 ENCOUNTER — Telehealth: Payer: Self-pay

## 2024-05-20 ENCOUNTER — Telehealth (HOSPITAL_BASED_OUTPATIENT_CLINIC_OR_DEPARTMENT_OTHER): Payer: Self-pay

## 2024-05-20 NOTE — Telephone Encounter (Signed)
   Name: Logan Norris  DOB: 27-May-1961  MRN: [id number]  Primary Cardiologist: Oneil Parchment, MD   Preoperative team, please contact this patient and set up a phone call appointment for further preoperative risk assessment. Please obtain consent and complete medication review. Thank you for your help.  I confirm that guidance regarding antiplatelet and oral anticoagulation therapy has been completed and, if necessary, noted below.  Per office protocol, if patient is without any new symptoms or concerns at the time of their virtual visit, he may hold aspirin  for 7 days prior to procedure. Please resume aspirin  as soon as possible postprocedure, at the discretion of the surgeon.    I also confirmed the patient resides in the state of [place] . As per [facility] Medical Board telemedicine laws, the patient must reside in the state in which the provider is licensed.   [name], NP 05/20/2024, 11:59 AM [facility]

## 2024-05-20 NOTE — Telephone Encounter (Signed)
   Pre-operative Risk Assessment    Patient Name: Logan Norris  DOB: [birth year] MRN: [id number]   Date of last office visit: 11/28/2022 with Dr. Jeffrie Date of next office visit: N/A   Request for Surgical Clearance    Procedure:  bilateral upper eyelid blepharoptosis repair  Date of Surgery:  Clearance 06/02/24                                 Surgeon:  Dr. Renzo Zaldivar  Surgeon's Group or Practice Name:  [facility]  Phone number:  [phone] Fax number:  [phone]   Type of Clearance Requested:   - Medical  - Pharmacy:  Hold Aspirin  -Not indicated   Type of Anesthesia:  Local     Additional requests/questions:    Logan Norris   05/20/2024, 11:48 AM

## 2024-05-20 NOTE — Telephone Encounter (Signed)
  Patient Consent for Virtual Visit        Logan Norris has provided verbal consent on 05/20/2024 for a virtual visit (video or telephone).   CONSENT FOR VIRTUAL VISIT FOR:  Logan Norris  By participating in this virtual visit I agree to the following:  I hereby voluntarily request, consent and authorize [facility] and its employed or contracted physicians, physician assistants, nurse practitioners or other licensed health care professionals (the Practitioner), to provide me with telemedicine health care services (the "Services) as deemed necessary by the treating Practitioner. I acknowledge and consent to receive the Services by the Practitioner via telemedicine. I understand that the telemedicine visit will involve communicating with the Practitioner through live audiovisual communication technology and the disclosure of certain medical information by electronic transmission. I acknowledge that I have been given the opportunity to request an in-person assessment or other available alternative prior to the telemedicine visit and am voluntarily participating in the telemedicine visit.  I understand that I have the right to withhold or withdraw my consent to the use of telemedicine in the course of my care at any time, without affecting my right to future care or treatment, and that the Practitioner or I may terminate the telemedicine visit at any time. I understand that I have the right to inspect all information obtained and/or recorded in the course of the telemedicine visit and may receive copies of available information for a reasonable fee.  I understand that some of the potential risks of receiving the Services via telemedicine include:  Delay or interruption in medical evaluation due to technological equipment failure or disruption; Information transmitted may not be sufficient (e.g. poor resolution of images) to allow for appropriate medical decision making by the  Practitioner; and/or  In rare instances, security protocols could fail, causing a breach of personal health information.  Furthermore, I acknowledge that it is my responsibility to provide information about my medical history, conditions and care that is complete and accurate to the best of my ability. I acknowledge that Practitioner's advice, recommendations, and/or decision may be based on factors not within their control, such as incomplete or inaccurate data provided by me or distortions of diagnostic images or specimens that may result from electronic transmissions. I understand that the practice of medicine is not an exact science and that Practitioner makes no warranties or guarantees regarding treatment outcomes. I acknowledge that a copy of this consent can be made available to me via my patient portal [facility] MyChart), or I can request a printed copy by calling the office of [facility].    I understand that my insurance will be billed for this visit.   I have read or had this consent read to me. I understand the contents of this consent, which adequately explains the benefits and risks of the Services being provided via telemedicine.  I have been provided ample opportunity to ask questions regarding this consent and the Services and have had my questions answered to my satisfaction. I give my informed consent for the services to be provided through the use of telemedicine in my medical care

## 2024-05-20 NOTE — Telephone Encounter (Signed)
 Preop tele appt now scheduled, med rec and consent done.

## 2024-05-22 ENCOUNTER — Ambulatory Visit

## 2024-05-22 NOTE — Telephone Encounter (Signed)
   Name: Logan Norris  DOB: 07-May-1961  MRN: [id number]  Primary Cardiologist: Oneil Parchment, MD  Chart reviewed as part of pre-operative protocol coverage. Because of Bassem Francis Stankey's past medical history and time since last visit, he will require a follow-up in-office visit in order to better assess preoperative cardiovascular risk.  Patient has not been seen in clinic since 11/28/2022 by Dr. Parchment.  Patient will require an in office visit in order to complete preoperative cardiac evaluation.  Pre-op covering staff: - Please schedule appointment and call patient to inform them. If patient already had an upcoming appointment within acceptable timeframe, please add pre-op clearance to the appointment notes so provider is aware. - Please contact requesting surgeon's office via preferred method (i.e, phone, fax) to inform them of need for appointment prior to surgery.  Carver Murakami D Levana Minetti, NP  05/22/2024, 1:20 PM

## 2024-05-22 NOTE — Telephone Encounter (Signed)
 Preop clearance OV now scheduled for next available as patient states he will be out of town 10/18-10/25. He has been scheduled for 10/27 @ 9.a.m

## 2024-05-22 NOTE — Progress Notes (Deleted)
 Virtual Visit via Telephone Note   Because of Conroy Aniceto Kyser co-morbid illnesses, he is at least at moderate risk for complications without adequate follow up.  This format is felt to be most appropriate for this patient at this time.  Due to technical limitations with video connection (technology), today's appointment will be conducted as an audio only telehealth visit, and Nashawn Corinthian Mizrahi verbally agreed to proceed in this manner.   All issues noted in this document were discussed and addressed.  No physical exam could be performed with this format.  Evaluation Performed:  Preoperative cardiovascular risk assessment _____________   Date:  05/22/2024   Patient ID:  Matilda Francis Ada, DOB 1961-03-09, MRN [id number] Patient Location:  Home Provider location:   Office  Primary Care Provider:  Watt Mirza, MD Primary Cardiologist:  Oneil Parchment, MD  Chief Complaint / Patient Profile   63 y.o. y/o male with a h/o *** who is pending *** and presents today for telephonic preoperative cardiovascular risk assessment.  History of Present Illness    Alfonso Keighan Amezcua is a 63 y.o. male who presents via audio/video conferencing for a telehealth visit today.  Pt was last seen in cardiology clinic on *** by ***.  At that time Jiaire Tavari Loadholt was doing well ***.  The patient is now pending procedure as outlined above. Since his last visit, he ***has remained stable from cardiac standpoint.  Past Medical History    Past Medical History:  Diagnosis Date   Anxiety 09/20/2011   Basal cell carcinoma 09/17/2013   Left forehead. Excised: 10/14/2013, margins free   Basal cell carcinoma 09/17/2013   Right upper eyelid   Basal cell carcinoma 05/06/2019   Right mid ear helix. Nodular pattern. Tx: [facility] 06/05/2019   Coronary artery disease involving native coronary artery of native heart with angina pectoris 06/09/2021   Depression    GERD (gastroesophageal reflux disease)    H/O:  gastrointestinal hemorrhage    HLD (hyperlipidemia)    HTN (hypertension)    Hx of diverticulitis 01/19/2009   Hypertriglyceridemia 05/27/2015   Prostate cancer (HCC) 10/23/2010   Tobacco abuse    Past Surgical History:  Procedure Laterality Date   INSERTION PROSTATE RADIATION SEED      Allergies  No Known Allergies  Home Medications    Prior to Admission medications   Medication Sig Start Date End Date Taking? Authorizing Provider  aspirin  EC 81 MG tablet Take 81 mg by mouth daily. Swallow whole.    [provider]  buPROPion  (WELLBUTRIN  XL) 150 MG 24 hr tablet TAKE ONE TABLET BY MOUTH EVERY DAY 12/17/23   Copland, Mirza, MD  celecoxib  (CELEBREX ) 200 MG capsule TAKE ONE CAPSULE BY MOUTH EVERY DAY 04/20/24   Copland, Mirza, MD  citalopram  (CELEXA ) 40 MG tablet TAKE ONE TABLET BY MOUTH EVERY DAY 12/17/23   Copland, Mirza, MD  ezetimibe  (ZETIA ) 10 MG tablet Take 1 tablet (10 mg total) by mouth daily. 04/29/24   Copland, Mirza, MD  hydrochlorothiazide  (HYDRODIURIL ) 25 MG tablet TAKE ONE TABLET BY MOUTH EVERY DAY 12/17/23   Copland, Mirza, MD  pantoprazole  (PROTONIX ) 40 MG tablet TAKE ONE TABLET BY MOUTH DAILY 12/17/23   Copland, Mirza, MD  rosuvastatin  (CRESTOR ) 20 MG tablet Take 1 tablet (20 mg total) by mouth daily. 04/21/24   Parchment Oneil BROCKS, MD  tamsulosin  (FLOMAX ) 0.4 MG CAPS capsule TAKE ONE CAPSULE BY MOUTH DAILY 12/17/23   Watt Mirza, MD    Physical Exam  Vital Signs:  Ladamien Edmundo Tedesco does not have vital signs available for review today.***  Given telephonic nature of communication, physical exam is limited. AAOx3. NAD. Normal affect.  Speech and respirations are unlabored.  Accessory Clinical Findings    None  Assessment & Plan    1.  Preoperative Cardiovascular Risk Assessment:  The patient was advised that if he develops new symptoms prior to surgery to contact our office to arrange for a follow-up visit, and he verbalized  understanding.  (Reminder: Include SBE prophylaxis/Antiplatelet/Anticoag Instructions***)  A copy of this note will be routed to requesting surgeon.  Time:   Today, I have spent *** minutes with the patient with telehealth technology discussing medical history, symptoms, and management plan.     Wrigley Plasencia D Hildagarde Holleran, NP  05/22/2024, 12:00 PM

## 2024-05-22 NOTE — Telephone Encounter (Signed)
 Attempted to contact the patient to cancel preop tele appt and schedule OV for clearance. [id number]

## 2024-05-28 NOTE — Progress Notes (Signed)
 Cardiology Office Note:  .   Date:  06/01/2024  ID:  Matilda Francis Ada, DOB [birth year], MRN [id number] PCP: Watt Mirza, MD  [facility] Providers Cardiologist:  Oneil Parchment, MD    History of Present Illness: .   David Mauri Tolen is a 63 y.o. male  with history of coronary artery disease moderate nonobstructive disease seen on coronary CT scan in 2022.  Subsequent high intensity statin with Zetia  10 mg added for LDL goal of less than 70.  Had had exertional chest pain.   According to original consult note: He saw Dr. Watt 05/25/2021 and reported unintentional weight loss, about 17 pounds in 18 months. Per Dr. Eleanore addendum 06/05/2021, chest CT returned and showed extensive multi-vessel coronary disease.  Mr. Geter reported sometimes having chest pain with exertion that resolves quickly when he rests. He was then referred to cardiology.    Previously aspirin  was discontinued due to bleeding issues subsequent to diverticulitis. He also has a history of a ruptured stomach ulcer with hematochezia.  Has seen Dr. Rosalie and has been appropriately cleared to use aspirin . He is taking his low-dose aspirin .     Patient on my schedule for preop clearance for bilateral upper eyelid blepharoptosis repair 06/02/24 by Dr. Renzo Zaldivar with [facility].Patient's mother just died on 07-06-2024 so he has to postpone surgery until possibly next week. He denies chest pain, dyspnea, dizziness, edema. Walks a lot. Walked 6.2 miles twice and walked the narrows in [place] last week. He lost 10 lbs with all the exercise. Smoking 1 ppd, was trying to dip tobacco but nothing is working.     ROS:    Studies Reviewed: SABRA    EKG Interpretation Date/Time:  Monday June 01 2024 09:02:56 EDT Ventricular Rate:  83 PR Interval:  174 QRS Duration:  86 QT Interval:  376 QTC Calculation: 441 R Axis:   -26  Text Interpretation: Normal sinus rhythm Inferior infarct , age undetermined Possible Anterior  infarct (cited on or before 09-May-2014) When compared with ECG of 09-May-2014 08:52, QRS axis Shifted left Confirmed by Parthenia Klinefelter [phone]) on 06/01/2024 9:12:09 AM    Prior CV Studies:    Coronary CTA 06/2021 FINDINGS: Image quality: Excellent.   Noise artifact is: Limited.   Coronary Arteries:  Normal coronary origin.  Right dominance.   Left main: The left main is a large caliber vessel with a normal take off from the left coronary cusp that bifurcates to form a left anterior descending artery and a left circumflex artery. There is minimal ostial calcified plaque (<25%).   Left anterior descending artery: The proximal LAD contains minimal calcified plaque (<25%). The mid LAD contains mild mixed density plaque (25-49%). The distal LAD is patent. The LAD gives off 2 patent diagonal branches.   Left circumflex artery: The LCX is non-dominant. There is minimal calcified plaque (<25%) in the proximal segment. The LCX gives off 2 patent obtuse marginal branches.   Right coronary artery: The RCA is dominant with normal take off from the right coronary cusp. There is minimal calcified plaque (<25%). The mid and distal segments are patent. The RCA terminates as a PDA and right posterolateral branch without evidence of plaque or stenosis.   Right Atrium: Right atrial size is within normal limits.   Right Ventricle: The right ventricular cavity is within normal limits.   Left Atrium: Left atrial size is normal in size with no left atrial appendage filling defect.   Left Ventricle: The ventricular  cavity size is within normal limits. There are no stigmata of prior infarction. There is no abnormal filling defect.   Pulmonary arteries: Normal in size without proximal filling defect.   Pulmonary veins: Normal pulmonary venous drainage.   Pericardium: Normal thickness with no significant effusion or calcium  present.   Cardiac valves: The aortic valve is trileaflet without  significant calcification. The mitral valve is normal structure without significant calcification.   Aorta: Normal caliber with no significant disease.   Extra-cardiac findings: See attached radiology report for non-cardiac structures.   IMPRESSION: 1. Coronary calcium  score of 393. This was 84th percentile for age-, sex, and race-matched controls.   2. Normal coronary origin with right dominance.   3. Mild mixed density plaque (25-49%) in the mid LAD.   4. Minimal calcified plaque in the LCX/RCA (<25%).   RECOMMENDATIONS: 1. Mild non-obstructive CAD (25-49%). Consider non-atherosclerotic causes of chest pain. Consider preventive therapy and risk factor modification.   Darryle Decent, MD     Electronically Signed   By: Darryle Decent M.D.   On: 06/21/2021 17:33  CT Chest 06/02/2021: COMPARISON:  None.   FINDINGS: Cardiovascular: Extensive multi-vessel coronary artery calcification. Global cardiac size within normal limits. No pericardial effusion. Central pulmonary arteries are of normal caliber. The thoracic aorta is of normal caliber. Mild atherosclerotic calcification noted within the descending thoracic aorta.   Mediastinum/Nodes: No enlarged mediastinal, hilar, or axillary lymph nodes. Thyroid  gland, trachea, and esophagus demonstrate no significant findings.   Lungs/Pleura: Mild centrilobular emphysema. No focal pulmonary nodules or infiltrates. No pneumothorax or pleural effusion. The central airways are widely patent.   Upper Abdomen: Probable simple cortical cyst incompletely visualized within the upper pole the right kidney. Mild hepatic steatosis. No acute abnormality.   Musculoskeletal: 15 mm circumscribed lytic lesion within the right sixth rib laterally demonstrates no significant matrix and minimal expansion of the rib. No cortical destruction, associated periosteal reaction, or soft tissue mass. Differential considerations are led by  nonaggressive lesion such as an enchondroma or unicameral bone cyst. No other lytic or blastic bone lesion. No acute bone abnormality.   IMPRESSION: No suspicious or indeterminate pulmonary nodule identified.   Mild emphysema.   Extensive multi-vessel coronary artery calcification   Aortic Atherosclerosis (ICD10-I70.0) and Emphysema (ICD10-J43.9).  Risk Assessment/Calculations:             Physical Exam:   VS:  BP 135/75 (BP Location: Left Arm, Patient Position: Sitting, Cuff Size: Normal)   Pulse 83   Ht 5' 8 (1.727 m)   Wt 193 lb (87.5 kg)   SpO2 94%   BMI 29.35 kg/m    Orhtostatics: No data found. Wt Readings from Last 3 Encounters:  06/01/24 193 lb (87.5 kg)  07/01/23 198 lb 6 oz (90 kg)  11/28/22 199 lb 12.8 oz (90.6 kg)    GEN: Well nourished, well developed in no acute distress NECK: No JVD; No carotid bruits CARDIAC:  RRR, no murmurs, rubs, gallops RESPIRATORY:  Clear to auscultation without rales, wheezing or rhonchi  ABDOMEN: Soft, non-tender, non-distended EXTREMITIES:  No edema; No deformity   ASSESSMENT AND PLAN: .     Preop clearance for bilateral upper eyelid blepharoptosis repair 06/02/24(delayed) by Dr. Renzo Zaldivar with [facility]. Low risk surgery and METs>8. No further cardiac w/u indicated prior to surgery. According to the Revised Cardiac Risk Index (RCRI), his Perioperative Risk of Major Cardiac Event is (%): 0.9  His Functional Capacity in METs is: 8.33 according to the [facility]  Activity Status Index (DASI).   Coronary artery disease involving native coronary artery of native heart with angina pectoris [facility]) Coronary artery disease noted on CT scan.  Multivessel.  Coronary CT  showed mild to moderate nonobstructive coronary artery disease.   He has had gastric ulcer as well as diverticular bleeding in the past.  It is been several years.  He is taking Protonix .  He did go to Dr. Rosalie in November 2022 and in reviewing those notes he was  cleared to start aspirin  from a GI standpoint.  Heart healthy diet.  Taking low-dose aspirin .   Tobacco abuse Discussed cessation at length. He knows he needs to quit. Will Rx Nicotine patches.   High cholesterol On both Zetia  10 mg as well as Crestor  to 20 mg.  Will do labs today            Dispo: f/u 1 yr Dr. Jeffrie  Signed, Olivia Pavy, PA-C

## 2024-06-01 ENCOUNTER — Encounter: Payer: Self-pay | Admitting: Physician Assistant

## 2024-06-01 ENCOUNTER — Ambulatory Visit: Attending: Physician Assistant | Admitting: Physician Assistant

## 2024-06-01 VITALS — BP 135/75 | HR 83 | Ht 68.0 in | Wt 193.0 lb

## 2024-06-01 DIAGNOSIS — Z720 Tobacco use: Secondary | ICD-10-CM | POA: Diagnosis not present

## 2024-06-01 DIAGNOSIS — I10 Essential (primary) hypertension: Secondary | ICD-10-CM | POA: Diagnosis not present

## 2024-06-01 DIAGNOSIS — I2510 Atherosclerotic heart disease of native coronary artery without angina pectoris: Secondary | ICD-10-CM | POA: Diagnosis not present

## 2024-06-01 DIAGNOSIS — Z01818 Encounter for other preprocedural examination: Secondary | ICD-10-CM | POA: Diagnosis not present

## 2024-06-01 DIAGNOSIS — E7800 Pure hypercholesterolemia, unspecified: Secondary | ICD-10-CM

## 2024-06-01 LAB — CBC

## 2024-06-01 LAB — LIPID PANEL

## 2024-06-01 MED ORDER — NICOTINE 7 MG/24HR TD PT24: MEDICATED_PATCH | TRANSDERMAL | 0 refills | Status: AC

## 2024-06-01 MED ORDER — NICOTINE 14 MG/24HR TD PT24
MEDICATED_PATCH | TRANSDERMAL | 0 refills | Status: AC
Start: 2024-06-01 — End: ?

## 2024-06-01 MED ORDER — NICOTINE 21 MG/24HR TD PT24
MEDICATED_PATCH | TRANSDERMAL | 0 refills | Status: AC
Start: 2024-06-01 — End: ?

## 2024-06-01 NOTE — Patient Instructions (Signed)
 Medication Instructions:  Your physician has recommended you make the following change in your medication:   START the Nicoderm patch, use as follows: Start the 21 mg patch, wear 1 daily for 6 weeks then reduce to the 14 mg Patch.  Wear the 14 mg patch for 2 weeks then reduce to the 7 mg patch.  Wear the 7 mg patch for 2 weeks then stop.  *If you need a refill on your cardiac medications before your next appointment, please call your pharmacy*  Lab Work: TODAY:  CMET, CBC, & LIPID   If you have labs (blood work) drawn today and your tests are completely normal, you will receive your results only by: MyChart Message (if you have MyChart) OR A paper copy in the mail If you have any lab test that is abnormal or we need to change your treatment, we will call you to review the results.  Testing/Procedures: None ordered  Follow-Up: At [facility], you and your health needs are our priority.  As part of our continuing mission to provide you with exceptional heart care, our providers are all part of one team.  This team includes your primary Cardiologist (physician) and Advanced Practice Providers or APPs (Physician Assistants and Nurse Practitioners) who all work together to provide you with the care you need, when you need it.  Your next appointment:   12 month(s)  Provider:   Oneil Parchment, MD    We recommend signing up for the patient portal called MyChart.  Sign up information is provided on this After Visit Summary.  MyChart is used to connect with patients for Virtual Visits (Telemedicine).  Patients are able to view lab/test results, encounter notes, upcoming appointments, etc.  Non-urgent messages can be sent to your provider as well.   To learn more about what you can do with MyChart, go to [email or web address].   Other Instructions

## 2024-06-02 ENCOUNTER — Ambulatory Visit: Payer: Self-pay | Admitting: Physician Assistant

## 2024-06-02 LAB — COMPREHENSIVE METABOLIC PANEL WITH GFR
ALT: 13 IU/L (ref 0–44)
AST: 15 IU/L (ref 0–40)
Albumin: 4.5 g/dL (ref 3.9–4.9)
Alkaline Phosphatase: 47 IU/L (ref 47–123)
BUN/Creatinine Ratio: 15 (ref 10–24)
BUN: 19 mg/dL (ref 8–27)
Bilirubin Total: 0.5 mg/dL (ref 0.0–1.2)
CO2: 25 mmol/L (ref 20–29)
Calcium: 9.8 mg/dL (ref 8.6–10.2)
Chloride: 101 mmol/L (ref 96–106)
Creatinine, Ser: 1.24 mg/dL (ref 0.76–1.27)
Globulin, Total: 2 g/dL (ref 1.5–4.5)
Glucose: 104 mg/dL — ABNORMAL HIGH (ref 70–99)
Potassium: 4.6 mmol/L (ref 3.5–5.2)
Sodium: 140 mmol/L (ref 134–144)
Total Protein: 6.5 g/dL (ref 6.0–8.5)
eGFR: 65 mL/min/1.73 (ref >59)

## 2024-06-02 LAB — LIPID PANEL
Cholesterol, Total: 113 mg/dL (ref 100–199)
HDL: 44 mg/dL (ref >39)
LDL CALC COMMENT:: 2.6 ratio (ref 0.0–5.0)
LDL Chol Calc (NIH): 50 mg/dL (ref 0–99)
Triglycerides: 100 mg/dL (ref 0–149)
VLDL Cholesterol Cal: 19 mg/dL (ref 5–40)

## 2024-06-02 LAB — CBC
Hematocrit: 45.8 % (ref 37.5–51.0)
Hemoglobin: 14.5 g/dL (ref 13.0–17.7)
MCH: 29.6 pg (ref 26.6–33.0)
MCHC: 31.7 g/dL (ref 31.5–35.7)
MCV: 94 fL (ref 79–97)
Platelets: 281 x10E3/uL (ref 150–450)
RBC: 4.9 x10E6/uL (ref 4.14–5.80)
RDW: 12.9 % (ref 11.6–15.4)
WBC: 8 x10E3/uL (ref 3.4–10.8)

## 2024-06-10 ENCOUNTER — Other Ambulatory Visit: Payer: Self-pay | Admitting: Family Medicine

## 2024-06-10 ENCOUNTER — Other Ambulatory Visit: Payer: Self-pay | Admitting: Cardiology

## 2024-06-18 DIAGNOSIS — H02831 Dermatochalasis of right upper eyelid: Secondary | ICD-10-CM | POA: Diagnosis not present

## 2024-06-18 DIAGNOSIS — H02834 Dermatochalasis of left upper eyelid: Secondary | ICD-10-CM | POA: Diagnosis not present

## 2024-06-18 DIAGNOSIS — H02413 Mechanical ptosis of bilateral eyelids: Secondary | ICD-10-CM | POA: Diagnosis not present

## 2024-07-01 ENCOUNTER — Encounter: Admitting: Family Medicine

## 2024-07-13 DIAGNOSIS — H53483 Generalized contraction of visual field, bilateral: Secondary | ICD-10-CM | POA: Diagnosis not present

## 2024-07-21 DIAGNOSIS — E119 Type 2 diabetes mellitus without complications: Secondary | ICD-10-CM | POA: Insufficient documentation

## 2024-07-21 NOTE — Progress Notes (Unsigned)
 Logan Fruin T. Cung Masterson, MD, CAQ Sports Medicine [facility] [address] [place], [address]  Phone: [phone]  FAX: [phone]  Logan Norris - 63 y.o. male  MRN [id number]  Date of Birth: 12-20-1960  Date: 07/22/2024  PCP: Watt Mirza, MD  Referral: Watt Mirza, MD  No chief complaint on file.  Patient Care Team: Watt Mirza, MD as PCP - General Jeffrie Oneil BROCKS, MD as PCP - Cardiology (Cardiology) Subjective:   Logan Norris is a 63 y.o. pleasant patient who presents for a welcome to Medicare exam:  Preventative Health Maintenance Visit:  Health Maintenance Summary Reviewed and updated, unless pt declines services.  Tobacco History Reviewed. Alcohol: No concerns, no excessive use Exercise Habits: Some activity, rec at least 30 mins 5 times a week STD concerns: no risk or activity to increase risk Drug Use: None  Francis is a very well-known patient with a history of prostate cancer, coronary disease, hypertension, hyperlipidemia, anxiety, depression and history of smoking.  Cardio ordered part of his routine blood work. - Needs PSA, thyroid , hemoglobin A1c. - Urine microalbumin  CT lung cancer screening -already enrolled in the pulmonary lung cancer screening program Flu shot COVID booster  Health Maintenance  Topic Date Due   Diabetic kidney evaluation - Urine ACR  Never done   COVID-19 Vaccine (3 - Pfizer risk series) 06/09/2020   Influenza Vaccine  03/06/2024   Lung Cancer Screening  07/14/2024   Colonoscopy  01/18/2025   Diabetic kidney evaluation - eGFR measurement  06/01/2025   DTaP/Tdap/Td (3 - Td or Tdap) 12/28/2026   Pneumococcal Vaccine: 50+ Years  Completed   Hepatitis C Screening  Completed   HIV Screening  Completed   Zoster Vaccines- Shingrix   Completed   Hepatitis B Vaccines 19-59 Average Risk  Aged Out   Meningococcal B Vaccine  Aged Out    Discussed the use of AI scribe  software for clinical note transcription with the patient, who gave verbal consent to proceed.  History of Present Illness     Immunization History  Administered Date(s) Administered   Influenza Whole 06/06/2010   Influenza, Quadrivalent, Recombinant, Inj, Pf 05/18/2018   Influenza, Seasonal, Injecte, Preservative Fre 04/28/2023   Influenza,inj,Quad PF,6+ Mos 06/02/2019, 05/25/2021   Influenza-Unspecified 05/07/2017   PFIZER(Purple Top)SARS-COV-2 Vaccination 04/03/2020, 05/12/2020   PNEUMOCOCCAL CONJUGATE-20 07/01/2023   Td 08/06/2005   Tdap 12/27/2016   Zoster Recombinant(Shingrix ) 04/23/2022, 08/23/2022    Patient Active Problem List   Diagnosis Date Noted   CAD (coronary artery disease) 06/09/2021    Priority: High   Prostate cancer (HCC) 10/23/2010    Priority: High   Depression, major, recurrent, moderate (HCC) 05/13/2017    Priority: Medium    GAD (generalized anxiety disorder) 05/13/2017    Priority: Medium    Tobacco abuse     Priority: Medium    Essential hypertension 02/09/2010    Priority: Medium    Pure hypercholesterolemia 01/19/2009    Priority: Medium    Hypertriglyceridemia 05/27/2015   Bleeding ulcer, hx of 09/30/2012   GERD 01/19/2009   Hx of diverticulitis 01/19/2009    Past Medical History:  Diagnosis Date   Anxiety 09/20/2011   Basal cell carcinoma 09/17/2013   Left forehead. Excised: 10/14/2013, margins free   Basal cell carcinoma 09/17/2013   Right upper eyelid   Basal cell carcinoma 05/06/2019   Right mid ear helix. Nodular pattern. Tx: [facility] 06/05/2019   Coronary artery disease involving  native coronary artery of native heart with angina pectoris 06/09/2021   Depression    GERD (gastroesophageal reflux disease)    H/O: gastrointestinal hemorrhage    HLD (hyperlipidemia)    HTN (hypertension)    Hx of diverticulitis 01/19/2009   Hypertriglyceridemia 05/27/2015   Prostate cancer (HCC) 10/23/2010   Tobacco abuse     Past Surgical  History:  Procedure Laterality Date   INSERTION PROSTATE RADIATION SEED      Family History  Problem Relation Age of Onset   Coronary artery disease Father        CABG x2   Heart disease Father 39       CABG   Stroke Other        Grandparent   Breast cancer Other        Family history -1st degree relative <50   Heart disease Paternal Grandfather     Social History   Social History Narrative   Not on file    Past Medical History, Surgical History, Social History, Family History, Problem List, Medications, and Allergies have been reviewed and updated if relevant.  Review of Systems: Pertinent positives are listed above.  Otherwise, a full 14 point review of systems has been done in full and it is negative except where it is noted positive.  Objective:   There were no vitals taken for this visit.    07/01/2023    4:00 PM  Fall Risk  Falls in the past year? 0  Was there an injury with Fall? 0   Fall Risk Category Calculator 0  Patient at Risk for Falls Due to No Fall Risks  Fall risk Follow up Falls evaluation completed     Data saved with a previous flowsheet row definition   Ideal Body Weight:   No results found.    10/19/2019    8:06 AM 09/01/2018    8:25 AM 06/24/2017   12:43 PM 05/13/2017   11:47 AM  Depression screen PHQ 2/9  Decreased Interest 0 0 1 3  Down, Depressed, Hopeless 0 0 0 3  PHQ - 2 Score 0 0 1 6  Altered sleeping   3 3  Tired, decreased energy   0 2  Change in appetite   0 2  Feeling bad or failure about yourself    0 2  Trouble concentrating   0 1  Moving slowly or fidgety/restless   0 0  Suicidal thoughts   0 1  PHQ-9 Score   4  17   Difficult doing work/chores   Somewhat difficult Very difficult     Data saved with a previous flowsheet row definition     GEN: well developed, well nourished, no acute distress Eyes: conjunctiva and lids normal, PERRLA, EOMI ENT: TM clear, nares clear, oral exam WNL Neck: supple, no lymphadenopathy,  no thyromegaly, no JVD Pulm: clear to auscultation and percussion, respiratory effort normal CV: regular rate and rhythm, S1-S2, no murmur, rub or gallop, no bruits, peripheral pulses normal and symmetric, no cyanosis, clubbing, edema or varicosities GI: soft, non-tender; no hepatosplenomegaly, masses; active bowel sounds all quadrants GU: deferred Lymph: no cervical, axillary or inguinal adenopathy MSK: gait normal, muscle tone and strength WNL, no joint swelling, effusions, discoloration, crepitus  SKIN: clear, good turgor, color WNL, no rashes, lesions, or ulcerations Neuro: normal mental status, normal strength, sensation, and motion Psych: alert; oriented to person, place and time, normally interactive and not anxious or depressed in appearance.  All labs reviewed  with patient.  @resultssec @  Results for orders placed or performed in visit on 06/01/24  Comp Met (CMET)   Collection Time: 06/01/24  9:42 AM  Result Value Ref Range   Glucose 104 (H) 70 - 99 mg/dL   BUN 19 8 - 27 mg/dL   Creatinine, Ser 8.75 0.76 - 1.27 mg/dL   eGFR 65 >40 fO/fpw/8.26   BUN/Creatinine Ratio 15 10 - 24   Sodium 140 134 - 144 mmol/L   Potassium 4.6 3.5 - 5.2 mmol/L   Chloride 101 96 - 106 mmol/L   CO2 25 20 - 29 mmol/L   Calcium  9.8 8.6 - 10.2 mg/dL   Total Protein 6.5 6.0 - 8.5 g/dL   Albumin 4.5 3.9 - 4.9 g/dL   Globulin, Total 2.0 1.5 - 4.5 g/dL   Bilirubin Total 0.5 0.0 - 1.2 mg/dL   Alkaline Phosphatase 47 47 - 123 IU/L   AST 15 0 - 40 IU/L   ALT 13 0 - 44 IU/L  CBC   Collection Time: 06/01/24  9:42 AM  Result Value Ref Range   WBC 8.0 3.4 - 10.8 x10E3/uL   RBC 4.90 4.14 - 5.80 x10E6/uL   Hemoglobin 14.5 13.0 - 17.7 g/dL   Hematocrit 54.1 62.4 - 51.0 %   MCV 94 79 - 97 fL   MCH 29.6 26.6 - 33.0 pg   MCHC 31.7 31.5 - 35.7 g/dL   RDW 87.0 88.3 - 84.5 %   Platelets 281 150 - 450 x10E3/uL  Lipid Profile   Collection Time: 06/01/24  9:42 AM  Result Value Ref Range   Cholesterol, Total  113 100 - 199 mg/dL   Triglycerides 899 0 - 149 mg/dL   HDL 44 >60 mg/dL   VLDL Cholesterol Cal 19 5 - 40 mg/dL   LDL Chol Calc (NIH) 50 0 - 99 mg/dL   Chol/HDL Ratio 2.6 0.0 - 5.0 ratio    Assessment and Plan:     ICD-10-CM   1. Healthcare maintenance  Z00.00       Assessment and Plan Assessment & Plan      Health Maintenance Exam: The patient's preventative maintenance and recommended screening tests for an annual wellness exam were reviewed in full today. Brought up to date unless services declined.  Counselled on the importance of diet, exercise, and its role in overall health and mortality. The patient's FH and SH was reviewed, including their home life, tobacco status, and drug and alcohol status.  Follow-up in 1 year for physical exam or additional follow-up below.  I have personally reviewed the Medicare Annual Wellness questionnaire and have noted 1. The patient's medical and social history 2. Their use of alcohol, tobacco or illicit drugs 3. Their current medications and supplements 4. The patient's functional ability including ADL's, fall risks, home safety risks and hearing or visual             impairment. 5. Diet and physical activities 6. Evidence for depression or mood disorders 7. Reviewed Updated provider list, see scanned forms and [facility] Snapshot.  8. Reviewed whether or not the patient has HCPOA or living will, and discussed what this means with the patient.  Recommended he bring in a copy for his chart in [facility].  The patients weight, height, BMI and visual acuity have been recorded in the chart I have made referrals, counseling and provided education to the patient based review of the above and I have provided the pt with a written personalized care  plan for preventive services.  I have provided the patient with a copy of your personalized plan for preventive services. Instructed to take the time to review along with their updated medication  list.  Disposition: No follow-ups on file.  Future Appointments  Date Time Provider Department Center  07/22/2024  8:20 AM Watt Mirza, MD [id number] [id number]  08/10/2024  4:00 PM [facility] 2 [facility]    No orders of the defined types were placed in this encounter.  There are no discontinued medications. No orders of the defined types were placed in this encounter.   Signed,  Mirza DASEN. Medha Pippen, MD   Allergies as of 07/22/2024   No Known Allergies      Medication List        Accurate as of July 21, 2024 11:21 AM. If you have any questions, ask your nurse or doctor.          aspirin  EC 81 MG tablet Take 81 mg by mouth daily. Swallow whole.   buPROPion  150 MG 24 hr tablet Commonly known as: WELLBUTRIN  XL TAKE ONE TABLET BY MOUTH EVERY DAY   celecoxib  200 MG capsule Commonly known as: CELEBREX  TAKE ONE CAPSULE BY MOUTH EVERY DAY   citalopram  40 MG tablet Commonly known as: CELEXA  TAKE ONE TABLET BY MOUTH EVERY DAY   ezetimibe  10 MG tablet Commonly known as: ZETIA  Take 1 tablet (10 mg total) by mouth daily.   hydrochlorothiazide  25 MG tablet Commonly known as: HYDRODIURIL  TAKE ONE TABLET BY MOUTH EVERY DAY   nicotine  7 mg/24hr patch Commonly known as: NICODERM CQ  - dosed in mg/24 hr PLACE 1 PATCH TO THE SKIN DAILY FOR 2 WEEKS THEN STOP   nicotine  14 mg/24hr patch Commonly known as: Nicoderm CQ  PLACE 1 PATCH DAILY FOR 2 WEEKS THEN REDUCE TO THE 7 MG PATCH   nicotine  21 mg/24hr patch Commonly known as: Nicoderm CQ  PLACE 1 PATCH DAILY FOR 6 WEEKS THEN REDUCE TO THE 14 MG PATCH   pantoprazole  40 MG tablet Commonly known as: PROTONIX  TAKE ONE TABLET BY MOUTH DAILY   rosuvastatin  20 MG tablet Commonly known as: CRESTOR  Take 1 tablet (20 mg total) by mouth daily.   tamsulosin  0.4 MG Caps capsule Commonly known as: FLOMAX  TAKE ONE CAPSULE BY MOUTH DAILY

## 2024-07-22 ENCOUNTER — Encounter: Payer: Self-pay | Admitting: Family Medicine

## 2024-07-22 ENCOUNTER — Ambulatory Visit: Payer: Self-pay | Admitting: Family Medicine

## 2024-07-22 ENCOUNTER — Ambulatory Visit: Admitting: Family Medicine

## 2024-07-22 VITALS — BP 120/70 | HR 82 | Temp 98.0°F | Ht 68.25 in | Wt 189.2 lb

## 2024-07-22 DIAGNOSIS — C61 Malignant neoplasm of prostate: Secondary | ICD-10-CM | POA: Diagnosis not present

## 2024-07-22 DIAGNOSIS — E7800 Pure hypercholesterolemia, unspecified: Secondary | ICD-10-CM | POA: Diagnosis not present

## 2024-07-22 DIAGNOSIS — Z0000 Encounter for general adult medical examination without abnormal findings: Secondary | ICD-10-CM

## 2024-07-22 DIAGNOSIS — R5383 Other fatigue: Secondary | ICD-10-CM | POA: Diagnosis not present

## 2024-07-22 DIAGNOSIS — E119 Type 2 diabetes mellitus without complications: Secondary | ICD-10-CM

## 2024-07-22 LAB — HEMOGLOBIN A1C: Hgb A1c MFr Bld: 6.1 % (ref 4.6–6.5)

## 2024-07-22 LAB — MICROALBUMIN / CREATININE URINE RATIO
Creatinine,U: 111.5 mg/dL
Microalb Creat Ratio: UNDETERMINED mg/g (ref 0.0–30.0)
Microalb, Ur: <0.7 mg/dL

## 2024-07-22 LAB — TSH: TSH: 2.06 u[IU]/mL (ref 0.35–5.50)

## 2024-07-22 MED ORDER — CITALOPRAM HYDROBROMIDE 20 MG PO TABS: 20.0000 mg | ORAL_TABLET | Freq: Every day | ORAL | 3 refills | Status: AC

## 2024-07-22 NOTE — Patient Instructions (Addendum)
 Stop Wellbutrin  now, and stay off of it for 2 weeks before we change your Celexa  dose.  In 2 weeks, start Celexa  (Citalopram ) 20 mg.  Take that for 2 weeks, then drop the dose to 1/2 tablet a day.  (10 mg) -  After 2 additional weeks, take the 1/2 tablet (10 mg) every other day for 2 weeks, then stop.    [facility] Low Glycemic Diet (Source: [facility], 2006)  Low Glycemic Foods (20-49) (Decrease risk of developing heart disease)  Best for Diabetes: Eat Mostly these  Breakfast Cereals: All-Bran All-Bran Fruit n Oats Fiber One Oatmeal (not instant) Oat bran  Fruits and fruit juices: (Limit to 1-2 servings per day) Apples Apricots (fresh & dried) Blackberries Blueberries Cherries Cranberries Peaches Pears Plums Prunes Grapefruit Raspberries Strawberries Tangerine  Juices: Apple juice Grapefruit juice Tomato juice  Beans and legumes (fresh-cooked): Black-eyed peas Butter beans Chick peas Lentils  Green beans Lima beans Kidney beans Navy beans Pinto beans Snow peas  Non-starchy vegetables: Asparagus, avocado, broccoli, cabbage, cauliflower, celery, cucumber, greens, lettuce, mushrooms, peppers, tomatoes, okra, onions, spinach, summer squash  Grains: Barley Bulgur Rye Wild rice  Nuts and oils : Almonds Peanuts Sunflower seeds Hazelnuts Pecans Walnuts Oils that are liquid at room temperature  Dairy, fish, meat, soy, and eggs: Milk, skim Lowfat cheese Yogurt, lowfat, fruit sugar sweetened Lean red meat Fish  Skinless chicken & turkey Shellfish Egg whites (up to 3 daily) Soy products  Egg yolks (up to 7 or _____ per week) Moderate Glycemic Foods (50-69)  OK sometimes with diabetes  Breakfast Cereals: Bran Buds Bran Chex Just Right Mini-Wheats  Special K Swiss muesli  Fruits: Banana (under-ripe) Dates Figs Grapes Kiwi Mango Oranges Raisins  Fruit Juices: Cranberry juice Orange juice  Beans and legumes: [place]-type baked  beans Canned pinto, kidney, or navy beans Green peas  Vegetables: Beets Carrots  Sweet potato Yam Corn on the cob  Breads: Pita (pocket) bread Oat bran bread Pumpernickel bread Rye bread Wheat bread, high fiber   Grains: Cornmeal Rice, brown Rice, white Couscous  Pasta: Macaroni Pizza, cheese Ravioli, meat filled Spaghetti, white   Nuts: Cashews Macadamia  Snacks: Chocolate Ice cream, lowfat Muffin Popcorn High Glycemic Foods (70-100)  Rare: Eat occaisionally with diabetes  THESE ARE THE WORST KIND OF FOODS FOR YOUR DIABETES  Breakfast Cereals: Cheerios Corn Chex Corn Flakes Cream of Wheat Grape Nuts Grape Nut Flakes Grits Nutri-Grain Puffed Rice Puffed Wheat Rice Chex Rice Krispies Shredded Wheat Team Total  Fruits: Pineapple Watermelon Banana (over-ripe) Beverages: Sodas, sweet tea, pineapple juice  Vegetables: Potato, baked, boiled, fried, mashed French fries Canned or frozen corn Parsnips Winter squash  Breads: Most breads (white and whole grain) Bagels Bread sticks Bread stuffing Kaiser roll Dinner rolls  Grains: Rice, instant Tapioca, with milk Candy and most cookies  Snacks: Donuts Corn chips Jelly beans Pretzels Pastries

## 2024-07-24 LAB — PSA, TOTAL WITH REFLEX TO PSA, FREE: PSA, Total: <0.1 ng/mL

## 2024-08-10 ENCOUNTER — Inpatient Hospital Stay: Admission: RE | Admit: 2024-08-10 | Source: Ambulatory Visit

## 2024-08-10 ENCOUNTER — Other Ambulatory Visit: Payer: Self-pay | Admitting: Family Medicine

## 2024-08-10 DIAGNOSIS — F17210 Nicotine dependence, cigarettes, uncomplicated: Secondary | ICD-10-CM

## 2024-08-10 DIAGNOSIS — Z87891 Personal history of nicotine dependence: Secondary | ICD-10-CM

## 2024-08-10 DIAGNOSIS — Z122 Encounter for screening for malignant neoplasm of respiratory organs: Secondary | ICD-10-CM

## 2024-08-17 ENCOUNTER — Encounter: Payer: Self-pay | Admitting: Family Medicine

## 2024-08-17 ENCOUNTER — Other Ambulatory Visit: Payer: Self-pay | Admitting: Acute Care

## 2024-08-17 DIAGNOSIS — F17210 Nicotine dependence, cigarettes, uncomplicated: Secondary | ICD-10-CM

## 2024-08-17 DIAGNOSIS — Z122 Encounter for screening for malignant neoplasm of respiratory organs: Secondary | ICD-10-CM

## 2024-08-17 DIAGNOSIS — Z87891 Personal history of nicotine dependence: Secondary | ICD-10-CM

## 2024-08-17 DIAGNOSIS — I359 Nonrheumatic aortic valve disorder, unspecified: Secondary | ICD-10-CM

## 2024-08-26 ENCOUNTER — Other Ambulatory Visit: Payer: Self-pay | Admitting: Family Medicine

## 2024-08-26 NOTE — Telephone Encounter (Signed)
 Last office visit 07/22/2024 for CPE.  Last refilled 04/20/2024 for #90 with 1 refill. Next appt: No future appointments.

## 2024-09-14 ENCOUNTER — Ambulatory Visit (HOSPITAL_COMMUNITY)
